# Patient Record
Sex: Female | Born: 1950 | Race: White | Hispanic: No | Marital: Married | Smoking: Never smoker
Health system: Southern US, Community
[De-identification: ages and names within clinical notes are randomized; demographics above are authoritative.]

## PROBLEM LIST (undated history)

## (undated) DIAGNOSIS — F411 Generalized anxiety disorder: Secondary | ICD-10-CM

## (undated) DIAGNOSIS — E785 Hyperlipidemia, unspecified: Secondary | ICD-10-CM

## (undated) DIAGNOSIS — G4730 Sleep apnea, unspecified: Secondary | ICD-10-CM

## (undated) DIAGNOSIS — K219 Gastro-esophageal reflux disease without esophagitis: Secondary | ICD-10-CM

## (undated) DIAGNOSIS — R0683 Snoring: Secondary | ICD-10-CM

## (undated) DIAGNOSIS — I10 Essential (primary) hypertension: Secondary | ICD-10-CM

## (undated) DIAGNOSIS — R1313 Dysphagia, pharyngeal phase: Secondary | ICD-10-CM

## (undated) DIAGNOSIS — H269 Unspecified cataract: Secondary | ICD-10-CM

## (undated) DIAGNOSIS — N870 Mild cervical dysplasia: Secondary | ICD-10-CM

## (undated) DIAGNOSIS — G3184 Mild cognitive impairment, so stated: Secondary | ICD-10-CM

## (undated) DIAGNOSIS — F32A Depression, unspecified: Secondary | ICD-10-CM

## (undated) DIAGNOSIS — F329 Major depressive disorder, single episode, unspecified: Secondary | ICD-10-CM

## (undated) DIAGNOSIS — G4700 Insomnia, unspecified: Secondary | ICD-10-CM

## (undated) HISTORY — PX: COLONOSCOPY: SHX174

## (undated) HISTORY — PX: TUBAL LIGATION: SHX77

## (undated) HISTORY — DX: Depression, unspecified: F32.A

## (undated) HISTORY — DX: Snoring: R06.83

## (undated) HISTORY — DX: Gastro-esophageal reflux disease without esophagitis: K21.9

## (undated) HISTORY — PX: HAMMER TOE SURGERY: SHX385

## (undated) HISTORY — DX: Sleep apnea, unspecified: G47.30

## (undated) HISTORY — PX: TENDON REPAIR: SHX5111

## (undated) HISTORY — DX: Essential (primary) hypertension: I10

## (undated) HISTORY — DX: Generalized anxiety disorder: F41.1

## (undated) HISTORY — DX: Insomnia, unspecified: G47.00

## (undated) HISTORY — DX: Dysphagia, pharyngeal phase: R13.13

## (undated) HISTORY — DX: Mild cervical dysplasia: N87.0

## (undated) HISTORY — PX: CATARACT EXTRACTION: SUR2

## (undated) HISTORY — DX: Unspecified cataract: H26.9

## (undated) HISTORY — PX: COLPOSCOPY: SHX161

## (undated) HISTORY — DX: Major depressive disorder, single episode, unspecified: F32.9

## (undated) HISTORY — DX: Mild cognitive impairment of uncertain or unknown etiology: G31.84

## (undated) HISTORY — PX: GYNECOLOGIC CRYOSURGERY: SHX857

## (undated) HISTORY — DX: Hyperlipidemia, unspecified: E78.5

---

## 1999-01-15 ENCOUNTER — Encounter: Payer: Self-pay | Admitting: Obstetrics and Gynecology

## 1999-01-15 ENCOUNTER — Encounter: Admission: RE | Admit: 1999-01-15 | Discharge: 1999-01-15 | Payer: Self-pay | Admitting: Internal Medicine

## 1999-02-04 ENCOUNTER — Encounter: Payer: Self-pay | Admitting: Obstetrics and Gynecology

## 1999-02-04 ENCOUNTER — Encounter: Admission: RE | Admit: 1999-02-04 | Discharge: 1999-02-04 | Payer: Self-pay | Admitting: Internal Medicine

## 1999-03-17 ENCOUNTER — Encounter: Payer: Self-pay | Admitting: Internal Medicine

## 2000-02-05 ENCOUNTER — Encounter: Payer: Self-pay | Admitting: Obstetrics and Gynecology

## 2000-02-05 ENCOUNTER — Encounter: Admission: RE | Admit: 2000-02-05 | Discharge: 2000-02-05 | Payer: Self-pay | Admitting: Obstetrics and Gynecology

## 2000-02-11 ENCOUNTER — Encounter: Payer: Self-pay | Admitting: Obstetrics and Gynecology

## 2000-02-11 ENCOUNTER — Encounter: Admission: RE | Admit: 2000-02-11 | Discharge: 2000-02-11 | Payer: Self-pay | Admitting: Obstetrics and Gynecology

## 2000-06-02 ENCOUNTER — Other Ambulatory Visit: Admission: RE | Admit: 2000-06-02 | Discharge: 2000-06-02 | Payer: Self-pay | Admitting: Obstetrics and Gynecology

## 2000-06-02 ENCOUNTER — Encounter (INDEPENDENT_AMBULATORY_CARE_PROVIDER_SITE_OTHER): Payer: Self-pay

## 2001-04-18 ENCOUNTER — Encounter: Payer: Self-pay | Admitting: Obstetrics and Gynecology

## 2001-04-18 ENCOUNTER — Encounter: Admission: RE | Admit: 2001-04-18 | Discharge: 2001-04-18 | Payer: Self-pay | Admitting: Obstetrics and Gynecology

## 2001-04-18 ENCOUNTER — Other Ambulatory Visit: Admission: RE | Admit: 2001-04-18 | Discharge: 2001-04-18 | Payer: Self-pay | Admitting: Obstetrics and Gynecology

## 2002-05-01 ENCOUNTER — Encounter: Admission: RE | Admit: 2002-05-01 | Discharge: 2002-05-01 | Payer: Self-pay | Admitting: Obstetrics and Gynecology

## 2002-05-01 ENCOUNTER — Encounter: Payer: Self-pay | Admitting: Obstetrics and Gynecology

## 2003-05-06 ENCOUNTER — Encounter: Admission: RE | Admit: 2003-05-06 | Discharge: 2003-05-06 | Payer: Self-pay | Admitting: Obstetrics and Gynecology

## 2004-02-24 ENCOUNTER — Ambulatory Visit: Payer: Self-pay | Admitting: Family Medicine

## 2004-05-11 ENCOUNTER — Ambulatory Visit: Payer: Self-pay | Admitting: Family Medicine

## 2004-05-25 ENCOUNTER — Ambulatory Visit: Payer: Self-pay | Admitting: Family Medicine

## 2004-06-08 ENCOUNTER — Ambulatory Visit: Payer: Self-pay | Admitting: Family Medicine

## 2004-07-30 ENCOUNTER — Ambulatory Visit: Payer: Self-pay | Admitting: Family Medicine

## 2004-08-29 ENCOUNTER — Ambulatory Visit: Payer: Self-pay | Admitting: Internal Medicine

## 2004-08-29 ENCOUNTER — Inpatient Hospital Stay (HOSPITAL_COMMUNITY): Admission: EM | Admit: 2004-08-29 | Discharge: 2004-09-01 | Payer: Self-pay | Admitting: Emergency Medicine

## 2004-09-08 ENCOUNTER — Ambulatory Visit: Payer: Self-pay | Admitting: Internal Medicine

## 2004-10-06 ENCOUNTER — Ambulatory Visit: Payer: Self-pay | Admitting: Internal Medicine

## 2004-10-30 ENCOUNTER — Other Ambulatory Visit: Admission: RE | Admit: 2004-10-30 | Discharge: 2004-10-30 | Payer: Self-pay | Admitting: Obstetrics and Gynecology

## 2004-12-01 ENCOUNTER — Ambulatory Visit: Payer: Self-pay | Admitting: Internal Medicine

## 2004-12-28 ENCOUNTER — Ambulatory Visit (HOSPITAL_BASED_OUTPATIENT_CLINIC_OR_DEPARTMENT_OTHER): Admission: RE | Admit: 2004-12-28 | Discharge: 2004-12-28 | Payer: Self-pay | Admitting: Neurosurgery

## 2005-01-07 ENCOUNTER — Ambulatory Visit: Payer: Self-pay | Admitting: Pulmonary Disease

## 2005-01-13 ENCOUNTER — Encounter: Admission: RE | Admit: 2005-01-13 | Discharge: 2005-01-13 | Payer: Self-pay | Admitting: Obstetrics and Gynecology

## 2005-02-03 ENCOUNTER — Encounter: Admission: RE | Admit: 2005-02-03 | Discharge: 2005-02-03 | Payer: Self-pay | Admitting: Obstetrics and Gynecology

## 2005-02-04 ENCOUNTER — Ambulatory Visit: Payer: Self-pay | Admitting: Internal Medicine

## 2005-05-06 ENCOUNTER — Ambulatory Visit: Payer: Self-pay | Admitting: Internal Medicine

## 2005-08-06 ENCOUNTER — Encounter: Admission: RE | Admit: 2005-08-06 | Discharge: 2005-08-06 | Payer: Self-pay | Admitting: Obstetrics and Gynecology

## 2005-09-28 ENCOUNTER — Ambulatory Visit: Payer: Self-pay | Admitting: Internal Medicine

## 2005-10-05 ENCOUNTER — Ambulatory Visit: Payer: Self-pay | Admitting: Internal Medicine

## 2005-11-26 ENCOUNTER — Other Ambulatory Visit: Admission: RE | Admit: 2005-11-26 | Discharge: 2005-11-26 | Payer: Self-pay | Admitting: Obstetrics and Gynecology

## 2006-01-21 ENCOUNTER — Encounter: Admission: RE | Admit: 2006-01-21 | Discharge: 2006-01-21 | Payer: Self-pay | Admitting: Obstetrics and Gynecology

## 2006-03-29 ENCOUNTER — Ambulatory Visit: Payer: Self-pay | Admitting: Internal Medicine

## 2006-10-06 ENCOUNTER — Ambulatory Visit: Payer: Self-pay | Admitting: Internal Medicine

## 2006-10-06 LAB — CONVERTED CEMR LAB
ALT: 22 units/L (ref 0–35)
Albumin: 4.2 g/dL (ref 3.5–5.2)
Alkaline Phosphatase: 55 units/L (ref 39–117)
BUN: 17 mg/dL (ref 6–23)
Basophils Absolute: 0 10*3/uL (ref 0.0–0.1)
Basophils Relative: 0.5 % (ref 0.0–1.0)
Bilirubin Urine: NEGATIVE
Blood in Urine, dipstick: NEGATIVE
CO2: 26 meq/L (ref 19–32)
Calcium: 9.6 mg/dL (ref 8.4–10.5)
Cholesterol: 268 mg/dL (ref 0–200)
Creatinine, Ser: 0.8 mg/dL (ref 0.4–1.2)
GFR calc Af Amer: 95 mL/min
Glucose, Urine, Semiquant: NEGATIVE
HDL: 42 mg/dL (ref >39.0)
Hemoglobin: 13.1 g/dL (ref 12.0–15.0)
Ketones, urine, test strip: NEGATIVE
MCHC: 34.7 g/dL (ref 30.0–36.0)
Monocytes Absolute: 0.6 10*3/uL (ref 0.2–0.7)
Monocytes Relative: 8.5 % (ref 3.0–11.0)
Platelets: 309 10*3/uL (ref 150–400)
Potassium: 4.8 meq/L (ref 3.5–5.1)
Protein, U semiquant: NEGATIVE
RDW: 12.1 % (ref 11.5–14.6)
TSH: 5.91 microintl units/mL — ABNORMAL HIGH (ref 0.35–5.50)
Total CHOL/HDL Ratio: 6.4
Triglycerides: 135 mg/dL (ref 0–149)
Urobilinogen, UA: 0.2
VLDL: 27 mg/dL (ref 0–40)
pH: 5.5

## 2006-10-10 DIAGNOSIS — I10 Essential (primary) hypertension: Secondary | ICD-10-CM | POA: Insufficient documentation

## 2006-10-13 ENCOUNTER — Ambulatory Visit: Payer: Self-pay | Admitting: Internal Medicine

## 2006-10-13 DIAGNOSIS — E785 Hyperlipidemia, unspecified: Secondary | ICD-10-CM | POA: Insufficient documentation

## 2006-10-13 DIAGNOSIS — F3342 Major depressive disorder, recurrent, in full remission: Secondary | ICD-10-CM

## 2006-11-28 ENCOUNTER — Other Ambulatory Visit: Admission: RE | Admit: 2006-11-28 | Discharge: 2006-11-28 | Payer: Self-pay | Admitting: Obstetrics and Gynecology

## 2006-12-14 ENCOUNTER — Ambulatory Visit: Payer: Self-pay | Admitting: Internal Medicine

## 2006-12-14 LAB — CONVERTED CEMR LAB
ALT: 40 units/L — ABNORMAL HIGH (ref 0–35)
AST: 37 units/L (ref 0–37)
Cholesterol: 193 mg/dL (ref 0–200)
LDL Cholesterol: 129 mg/dL — ABNORMAL HIGH (ref 0–99)
Total CHOL/HDL Ratio: 3.7
Triglycerides: 61 mg/dL (ref 0–149)

## 2006-12-19 ENCOUNTER — Ambulatory Visit: Payer: Self-pay | Admitting: Internal Medicine

## 2007-02-24 ENCOUNTER — Telehealth: Payer: Self-pay | Admitting: Internal Medicine

## 2007-03-08 ENCOUNTER — Encounter: Admission: RE | Admit: 2007-03-08 | Discharge: 2007-03-08 | Payer: Self-pay | Admitting: Obstetrics and Gynecology

## 2007-04-14 ENCOUNTER — Ambulatory Visit: Payer: Self-pay | Admitting: Internal Medicine

## 2007-06-12 ENCOUNTER — Telehealth: Payer: Self-pay | Admitting: *Deleted

## 2007-07-07 ENCOUNTER — Telehealth: Payer: Self-pay | Admitting: Internal Medicine

## 2007-08-07 ENCOUNTER — Telehealth: Payer: Self-pay | Admitting: Internal Medicine

## 2007-08-25 ENCOUNTER — Ambulatory Visit: Payer: Self-pay | Admitting: Internal Medicine

## 2007-08-25 DIAGNOSIS — K219 Gastro-esophageal reflux disease without esophagitis: Secondary | ICD-10-CM

## 2007-10-27 ENCOUNTER — Ambulatory Visit: Payer: Self-pay | Admitting: Internal Medicine

## 2007-10-27 LAB — CONVERTED CEMR LAB
Blood in Urine, dipstick: NEGATIVE
Nitrite: NEGATIVE
Protein, U semiquant: NEGATIVE
Urobilinogen, UA: 0.2
WBC Urine, dipstick: NEGATIVE

## 2008-02-21 ENCOUNTER — Ambulatory Visit: Payer: Self-pay | Admitting: Internal Medicine

## 2008-02-21 ENCOUNTER — Telehealth: Payer: Self-pay | Admitting: Internal Medicine

## 2008-02-29 ENCOUNTER — Ambulatory Visit: Payer: Self-pay | Admitting: Internal Medicine

## 2008-02-29 LAB — CONVERTED CEMR LAB
AST: 25 units/L (ref 0–37)
Albumin: 4.1 g/dL (ref 3.5–5.2)
Alkaline Phosphatase: 43 units/L (ref 39–117)
BUN: 21 mg/dL (ref 6–23)
Bilirubin Urine: NEGATIVE
Bilirubin, Direct: 0.1 mg/dL (ref 0.0–0.3)
Blood in Urine, dipstick: NEGATIVE
Chloride: 111 meq/L (ref 96–112)
Cholesterol: 201 mg/dL (ref 0–200)
Eosinophils Absolute: 0.4 10*3/uL (ref 0.0–0.7)
Eosinophils Relative: 4.7 % (ref 0.0–5.0)
GFR calc Af Amer: 73 mL/min
GFR calc non Af Amer: 60 mL/min
HDL: 46.5 mg/dL (ref >39.0)
Ketones, urine, test strip: NEGATIVE
MCV: 91.7 fL (ref 78.0–100.0)
Monocytes Absolute: 0.7 10*3/uL (ref 0.1–1.0)
Monocytes Relative: 9 % (ref 3.0–12.0)
Neutrophils Relative %: 55.3 % (ref 43.0–77.0)
Nitrite: NEGATIVE
Platelets: 320 10*3/uL (ref 150–400)
Potassium: 4.3 meq/L (ref 3.5–5.1)
RDW: 11.9 % (ref 11.5–14.6)
Sodium: 140 meq/L (ref 135–145)
Total Bilirubin: 0.7 mg/dL (ref 0.3–1.2)
Total CHOL/HDL Ratio: 4.3
Triglycerides: 84 mg/dL (ref 0–149)
Urobilinogen, UA: 0.2
WBC: 7.6 10*3/uL (ref 4.5–10.5)

## 2008-03-08 ENCOUNTER — Ambulatory Visit: Payer: Self-pay | Admitting: Internal Medicine

## 2008-03-08 DIAGNOSIS — R1313 Dysphagia, pharyngeal phase: Secondary | ICD-10-CM

## 2008-03-08 DIAGNOSIS — R0683 Snoring: Secondary | ICD-10-CM

## 2008-03-08 HISTORY — DX: Snoring: R06.83

## 2008-03-08 HISTORY — DX: Dysphagia, pharyngeal phase: R13.13

## 2008-03-25 ENCOUNTER — Ambulatory Visit: Payer: Self-pay | Admitting: Gastroenterology

## 2008-04-10 ENCOUNTER — Ambulatory Visit: Payer: Self-pay | Admitting: Gastroenterology

## 2008-04-12 ENCOUNTER — Telehealth (INDEPENDENT_AMBULATORY_CARE_PROVIDER_SITE_OTHER): Payer: Self-pay | Admitting: *Deleted

## 2008-05-31 ENCOUNTER — Ambulatory Visit: Payer: Self-pay | Admitting: Internal Medicine

## 2008-05-31 ENCOUNTER — Ambulatory Visit: Payer: Self-pay | Admitting: Gastroenterology

## 2008-05-31 LAB — CONVERTED CEMR LAB
Albumin: 3.9 g/dL (ref 3.5–5.2)
Alkaline Phosphatase: 41 units/L (ref 39–117)
Cholesterol: 196 mg/dL (ref 0–200)
HDL: 44.6 mg/dL (ref >39.00)
LDL Cholesterol: 140 mg/dL — ABNORMAL HIGH (ref 0–99)
Total CHOL/HDL Ratio: 4
Total Protein: 7.7 g/dL (ref 6.0–8.3)
Triglycerides: 57 mg/dL (ref 0.0–149.0)
VLDL: 11.4 mg/dL (ref 0.0–40.0)

## 2008-06-10 ENCOUNTER — Ambulatory Visit: Payer: Self-pay | Admitting: Internal Medicine

## 2008-10-03 ENCOUNTER — Ambulatory Visit: Payer: Self-pay | Admitting: Internal Medicine

## 2008-10-03 LAB — CONVERTED CEMR LAB
ALT: 23 units/L (ref 0–35)
Alkaline Phosphatase: 40 units/L (ref 39–117)
Bilirubin, Direct: 0.1 mg/dL (ref 0.0–0.3)
CO2: 28 meq/L (ref 19–32)
Chloride: 103 meq/L (ref 96–112)
Creatinine, Ser: 1.1 mg/dL (ref 0.4–1.2)
LDL Cholesterol: 124 mg/dL — ABNORMAL HIGH (ref 0–99)
Potassium: 4 meq/L (ref 3.5–5.1)
Sodium: 138 meq/L (ref 135–145)
Total Bilirubin: 0.8 mg/dL (ref 0.3–1.2)
Total CHOL/HDL Ratio: 4
Total Protein: 7.5 g/dL (ref 6.0–8.3)
Triglycerides: 66 mg/dL (ref 0.0–149.0)

## 2008-10-11 ENCOUNTER — Ambulatory Visit: Payer: Self-pay | Admitting: Internal Medicine

## 2008-10-11 DIAGNOSIS — E669 Obesity, unspecified: Secondary | ICD-10-CM

## 2009-03-04 ENCOUNTER — Encounter: Admission: RE | Admit: 2009-03-04 | Discharge: 2009-03-04 | Payer: Self-pay | Admitting: Obstetrics and Gynecology

## 2009-03-07 ENCOUNTER — Other Ambulatory Visit: Admission: RE | Admit: 2009-03-07 | Discharge: 2009-03-07 | Payer: Self-pay | Admitting: Obstetrics and Gynecology

## 2009-03-07 ENCOUNTER — Ambulatory Visit: Payer: Self-pay | Admitting: Obstetrics and Gynecology

## 2009-05-08 ENCOUNTER — Ambulatory Visit: Payer: Self-pay | Admitting: Obstetrics and Gynecology

## 2009-05-08 ENCOUNTER — Encounter: Payer: Self-pay | Admitting: Internal Medicine

## 2009-11-06 ENCOUNTER — Ambulatory Visit: Payer: Self-pay | Admitting: Family Medicine

## 2009-12-02 ENCOUNTER — Ambulatory Visit: Payer: Self-pay | Admitting: Internal Medicine

## 2009-12-02 LAB — CONVERTED CEMR LAB
Albumin: 4.1 g/dL (ref 3.5–5.2)
BUN: 26 mg/dL — ABNORMAL HIGH (ref 6–23)
Basophils Absolute: 0 10*3/uL (ref 0.0–0.1)
CO2: 27 meq/L (ref 19–32)
Calcium: 9.8 mg/dL (ref 8.4–10.5)
Cholesterol: 164 mg/dL (ref 0–200)
Eosinophils Absolute: 0.5 10*3/uL (ref 0.0–0.7)
Glucose, Bld: 110 mg/dL — ABNORMAL HIGH (ref 70–99)
HCT: 34.7 % — ABNORMAL LOW (ref 36.0–46.0)
HDL: 47.2 mg/dL (ref >39.00)
Hemoglobin: 11.9 g/dL — ABNORMAL LOW (ref 12.0–15.0)
Ketones, ur: NEGATIVE mg/dL
Lymphs Abs: 2.3 10*3/uL (ref 0.7–4.0)
MCHC: 34.2 g/dL (ref 30.0–36.0)
Neutro Abs: 3.7 10*3/uL (ref 1.4–7.7)
Nitrite: NEGATIVE
Platelets: 311 10*3/uL (ref 150.0–400.0)
Potassium: 4.6 meq/L (ref 3.5–5.1)
RDW: 13 % (ref 11.5–14.6)
Sodium: 140 meq/L (ref 135–145)
Specific Gravity, Urine: 1.02 (ref 1.000–1.030)
TSH: 4.94 microintl units/mL (ref 0.35–5.50)
Total Bilirubin: 0.7 mg/dL (ref 0.3–1.2)
Urobilinogen, UA: 0.2 (ref 0.0–1.0)

## 2009-12-09 ENCOUNTER — Encounter: Payer: Self-pay | Admitting: Internal Medicine

## 2009-12-09 ENCOUNTER — Ambulatory Visit: Payer: Self-pay | Admitting: Internal Medicine

## 2009-12-09 DIAGNOSIS — R739 Hyperglycemia, unspecified: Secondary | ICD-10-CM

## 2009-12-09 DIAGNOSIS — D6489 Other specified anemias: Secondary | ICD-10-CM | POA: Insufficient documentation

## 2009-12-09 LAB — CONVERTED CEMR LAB: Transferrin: 377.4 mg/dL — ABNORMAL HIGH (ref 212.0–360.0)

## 2009-12-12 ENCOUNTER — Ambulatory Visit: Payer: Self-pay | Admitting: Internal Medicine

## 2010-01-27 ENCOUNTER — Encounter (INDEPENDENT_AMBULATORY_CARE_PROVIDER_SITE_OTHER): Payer: Self-pay | Admitting: *Deleted

## 2010-02-15 ENCOUNTER — Encounter: Payer: Self-pay | Admitting: Obstetrics and Gynecology

## 2010-02-26 NOTE — Assessment & Plan Note (Signed)
Summary: ?bronchitus/cjr   Vital Signs:  Patient profile:   60 year old female Weight:      183 pounds Temp:     99.0 degrees F oral BP sitting:   160 / 92  (left arm) Cuff size:   regular  Vitals Entered By: Sid Falcon LPN (November 06, 2009 4:29 PM)  History of Present Illness: Acute visit. One-week history of nasal congestion and cough productive of green sputum. Symptoms seem to be getting worse. Frequent postnasal drip symptoms. Nonsmoker. No significant headaches or facial pain. Using Mucinex DM without much relief.  Allergies: 1)  ! * Lotrel  Past History:  Past Medical History: Last updated: 03/25/2008 High cholesterol Hypertension Depression GERD PMH reviewed for relevance  Review of Systems      See HPI  Physical Exam  General:  Well-developed,well-nourished,in no acute distress; alert,appropriate and cooperative throughout examination Ears:  External ear exam shows no significant lesions or deformities.  Otoscopic examination reveals clear canals, tympanic membranes are intact bilaterally without bulging, retraction, inflammation or discharge. Hearing is grossly normal bilaterally. Nose:  External nasal examination shows no deformity or inflammation. Nasal mucosa are pink and moist without lesions or exudates. Mouth:  Oral mucosa and oropharynx without lesions or exudates.  Teeth in good repair. Neck:  No deformities, masses, or tenderness noted. Lungs:  Normal respiratory effort, chest expands symmetrically. Lungs are clear to auscultation, no crackles or wheezes. Heart:  Normal rate and regular rhythm. S1 and S2 normal without gallop, murmur, click, rub or other extra sounds. Skin:  no rashes.     Impression & Recommendations:  Problem # 1:  SINUSITIS, ACUTE (ICD-461.9)  Her updated medication list for this problem includes:    Azithromycin 250 Mg Tabs (Azithromycin) .Marland Kitchen... 2 by mouth today then one by mouth once daily for 4 days.  Complete Medication  List: 1)  Azor 5-40 Mg Tabs (Amlodipine-olmesartan) .... One by mouth daily 2)  Ambien 10 Mg Tabs (Zolpidem tartrate) .... One by mouth q hs 3)  Lipofen 150 Mg Caps (Fenofibrate) .... One by mouth daily 4)  Lexapro 20 Mg Tabs (Escitalopram oxalate) .... 1/2 by mouth daily 5)  Fish Oil Concentrate 1000 Mg Caps (Omega-3 fatty acids) .... Take 1 tablet by mouth once a day 6)  Calcium Carbonate-vitamin D 600-400 Mg-unit Tabs (Calcium carbonate-vitamin d) .... One tablet by mouth once daily 7)  Multivitamins Tabs (Multiple vitamin) .... One tablet by mouth once daily 8)  Aciphex 20 Mg Tbec (Rabeprazole sodium) .... One by mouth daily 9)  Travatan 0.004 % Soln (Travoprost) .... Both eye once daily 10)  Azithromycin 250 Mg Tabs (Azithromycin) .... 2 by mouth today then one by mouth once daily for 4 days.  Patient Instructions: 1)  Acute Bronchitis symptoms for less then 10 days are not  helped by antibiotics. Take over the counter cough medications. Call if no improvement in 5-7 days, sooner if increasing cough, fever, or new symptoms ( shortness of breath, chest pain) .  Prescriptions: AZITHROMYCIN 250 MG TABS (AZITHROMYCIN) 2 by mouth today then one by mouth once daily for 4 days.  #6 x 0   Entered and Authorized by:   Evelena Peat MD   Signed by:   Evelena Peat MD on 11/06/2009   Method used:   Print then Give to Patient   RxID:   [id number]

## 2010-02-26 NOTE — Assessment & Plan Note (Signed)
Summary: cpx//ccm   Vital Signs:  Patient profile:   60 year old female Height:      65 inches Weight:      180 pounds BMI:     30.06 Temp:     98.2 degrees F oral Pulse rate:   72 / minute Resp:     14 per minute BP sitting:   136 / 80  (left arm)  Vitals Entered By: Willy Eddy, LPN (December 09, 2009 3:08 PM) CC: cpx Is Patient Diabetic? No   Primary Care Provider:  Darryll Capers, MD  CC:  cpx.  History of Present Illness: The pt was asked about all immunizations, health maint. services that are appropriate to their age and was given guidance on diet exercize  and weight management   The pt has a hx  GERD and noted mild anemai. She has not had a colon ... 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) 10)  Preventive Health Care  (ICD-V70.0) 11)  Depression  (ICD-311) 12)  Hyperlipidemia  (ICD-272.4) 13)  Family History Diabetes 1st Degree Relative  (ICD-V18.0) 14)  Hypertension  (ICD-401.9)  Current Problems (verified): 1)  Sinusitis, Acute  (ICD-461.9) 2)  Overweight  (ICD-278.02) 3)  Snoring  (ICD-786.09) 4)  Dysphagia Pharyngeal Phase  (ONG-295.28) 5)  Pneumonia Due To Mycoplasma Pneumoniae  (ICD-483.0) 6)  Acute Cystitis  (ICD-595.0) 7)  Urinary Frequency  (ICD-788.41) 8)  Gerd  (ICD-530.81) 9)  Hyperlipidemia, 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 One By Mouth Daily 2)  Ambien 10 Mg  Tabs (Zolpidem Tartrate) .... One By Mouth Q Hs 3)  Lipofen 150 Mg Caps (Fenofibrate) .... One By Mouth Daily 4)  Lexapro 20 Mg Tabs (Escitalopram Oxalate) .... 1/2 By Mouth Daily 5)  Fish Oil Concentrate 1000 Mg Caps (Omega-3 Fatty Acids) .... Take 1 Tablet By Mouth Once A Day 6)  Calcium Carbonate-Vitamin D 600-400 Mg-Unit  Tabs (Calcium Carbonate-Vitamin D) .... One Tablet By Mouth Once Daily 7)  Multivitamins   Tabs (Multiple Vitamin) .... One Tablet By Mouth Once Daily 8)  Aciphex 20 Mg Tbec (Rabeprazole Sodium) .... One By Mouth Daily 9)  Travatan 0.004 % Soln (Travoprost) .... Both Eye Once Daily 10)  Azithromycin 250 Mg Tabs (Azithromycin) .... 2 By Mouth Today Then One By Mouth Once Daily For 4 Days.  Current Medications (verified): 1)  Azor 5-40 Mg  Tabs (Amlodipine-Olmesartan) .... One By Mouth Daily 2)  Ambien 10 Mg  Tabs (Zolpidem Tartrate) .... One By Mouth Q Hs 3)  Lipofen 150 Mg Caps (Fenofibrate) .... One By Mouth Daily 4)  Lexapro 20 Mg Tabs (Escitalopram Oxalate) .... 1/2 By Mouth Daily 5)  Fish Oil Concentrate 1000 Mg Caps (Omega-3  Fatty Acids) .... Take 1 Tablet By Mouth Once A Day 6)  Calcium Carbonate-Vitamin D 600-400 Mg-Unit  Tabs (Calcium Carbonate-Vitamin D) .... One Tablet By Mouth Once Daily 7)  Multivitamins   Tabs (Multiple Vitamin) .... One Tablet By Mouth Once Daily 8)  Prevacid 15 Mg Cpdr (Lansoprazole) .Marland Kitchen.. 1 Once Daily 9)  Travatan 0.004 % Soln (Travoprost) .... Both Eye Once Daily  Allergies (verified): 1)  ! * Lotrel  Past History:  Family History: Last updated: 03/25/2008 Family History Diabetes 1st degree relative  sister Family History Hypertension Family History of Stroke M 1st degree relative <50 no colon cancer  Social History: Last updated:  03/25/2008 Married Never Smoked Alcohol use-no Drug use-no Regular exercise-yes  Risk Factors: Exercise: yes (10/10/2006)  Risk Factors: Smoking Status: never (12/09/2009)  Past medical, surgical, family and social histories (including risk factors) reviewed, and no changes noted (except as noted below).  Past Medical History: Reviewed history from 03/25/2008 and no changes required. High cholesterol Hypertension Depression GERD  Past Surgical History: Reviewed history from 03/25/2008 and no changes required. Denies surgical history  Family History: Reviewed history from 03/25/2008 and no changes required. Family History Diabetes 1st degree relative  sister Family History Hypertension Family History of Stroke M 1st degree relative <50 no colon cancer  Social History: Reviewed history from 03/25/2008 and no changes required. Married Never Smoked Alcohol use-no Drug use-no Regular exercise-yes  Review of Systems  The patient denies anorexia, fever, weight loss, weight gain, vision loss, decreased hearing, hoarseness, chest pain, syncope, dyspnea on exertion, peripheral edema, prolonged cough, headaches, hemoptysis, abdominal pain, melena, hematochezia, severe indigestion/heartburn, hematuria, incontinence, genital sores, muscle weakness, suspicious skin lesions, transient blindness, difficulty walking, depression, unusual weight change, abnormal bleeding, enlarged lymph nodes, angioedema, and breast masses.    Physical Exam  General:  Well-developed,well-nourished,in no acute distress; alert,appropriate and cooperative throughout examination Head:  normocephalic and no abnormalities palpated.   Eyes:  pupils equal and pupils round.   Ears:  R ear normal and L ear normal.   Nose:  External nasal examination shows no deformity or inflammation. Nasal mucosa are pink and moist without lesions or exudates. Mouth:  Oral mucosa and oropharynx without lesions or exudates.   Teeth in good repair. Neck:  No deformities, masses, or tenderness noted. Lungs:  Normal respiratory effort, chest expands symmetrically. Lungs are clear to auscultation, no crackles or wheezes. Heart:  Normal rate and regular rhythm. S1 and S2 normal without gallop, murmur, click, rub or other extra sounds.   Impression & Recommendations:  Problem # 1:  OVERWEIGHT (ICD-278.02)  weigth  loss neded  Ht: 65 (12/09/2009)   Wt: 180 (12/09/2009)   BMI: 30.06 (12/09/2009)  Problem # 2:  HYPERLIPIDEMIA, TYPE IV (ICD-272.4) stable Her updated medication list for this problem includes:    Lipofen 150 Mg Caps (Fenofibrate) ..... One by mouth daily  Labs Reviewed: SGOT: 27 (12/02/2009)   SGPT: 22 (12/02/2009)  Prior 10 Yr Risk Heart Disease: 13 % (04/14/2007)   HDL:47.20 (12/02/2009), 47.20 (10/03/2008)  LDL:107 (12/02/2009), 124 (10/03/2008)  Chol:164 (12/02/2009), 184 (10/03/2008)  Trig:49.0 (12/02/2009), 66.0 (10/03/2008)  Problem # 3:  PREDIABETES (ICD-790.29)  Labs Reviewed: Creat: 1.1 (12/02/2009)     Problem # 4:  PREVENTIVE HEALTH CARE (ICD-V70.0) Assessment: Unchanged  Orders: EKG w/ Interpretation (93000) Gastroenterology Referral (GI)  Mammogram: normal (05/23/2009) Pap smear: normal (05/23/2009) Td Booster: Historical (01/26/2003)   Chol: 164 (12/02/2009)   HDL: 47.20 (12/02/2009)   LDL: 107 (12/02/2009)  TG: 49.0 (12/02/2009) TSH: 4.94 (12/02/2009)   Next mammogram due:: 05/2010 (12/09/2009)  Discussed using sunscreen, use of alcohol, drug use, self breast exam, routine dental care, routine eye care, schedule for GYN exam, routine physical exam, seat belts, multiple vitamins, osteoporosis prevention, adequate calcium intake in diet, recommendations for immunizations, mammograms and Pap smears.  Discussed exercise and checking cholesterol.  Discussed gun safety, safe sex, and contraception.  Problem # 5:  HYPERTENSION (ICD-401.9)  Her updated medication list for this  problem includes:    Azor 5-40 Mg Tabs (Amlodipine-olmesartan) ..... One by mouth daily  BP today: 136/80 Prior BP: 160/92 (11/06/2009)  Prior 10 Yr Risk Heart Disease: 13 % (04/14/2007)  Labs Reviewed: K+: 4.6 (12/02/2009) Creat: : 1.1 (12/02/2009)   Chol: 164 (12/02/2009)   HDL: 47.20 (12/02/2009)   LDL: 107 (12/02/2009)   TG: 49.0 (12/02/2009)  Complete Medication List: 1)  Azor 5-40 Mg Tabs (Amlodipine-olmesartan) .... One by mouth daily 2)  Ambien 10 Mg Tabs (Zolpidem tartrate) .... One by mouth q hs 3)  Lipofen 150 Mg Caps (Fenofibrate) .... One by mouth daily 4)  Lexapro 20 Mg Tabs (Escitalopram oxalate) .... 1/2 by mouth daily 5)  Fish Oil Concentrate 1000 Mg Caps (Omega-3 fatty acids) .... Take 1 tablet by mouth once a day 6)  Calcium Carbonate-vitamin D 600-400 Mg-unit Tabs (Calcium carbonate-vitamin d) .... One tablet by mouth once daily 7)  Multivitamins Tabs (Multiple vitamin) .... One tablet by mouth once daily 8)  Prevacid 15 Mg Cpdr (Lansoprazole) .Marland Kitchen.. 1 once daily 9)  Travatan 0.004 % Soln (Travoprost) .... Both eye once daily  Other Orders: Venipuncture ([address]) TLB-IBC Pnl (Iron/FE;Transferrin) (83550-IBC)  Patient Instructions: 1)  Please schedule a follow-up appointment in 4 months.   Orders Added: 1)  EKG w/ Interpretation [93000] 2)  Venipuncture [36415] 3)  TLB-IBC Pnl (Iron/FE;Transferrin) [83550-IBC] 4)  Gastroenterology Referral [GI]     Preventive Care Screening  Mammogram:    Date:  05/23/2009    Next Due:  05/2010    Results:  normal   Pap Smear:    Date:  05/23/2009    Next Due:  05/2010    Results:  normal    Appended Document: Orders Update    Clinical Lists Changes  Orders: Added new Service order of Specimen Handling (60454) - Signed

## 2010-02-26 NOTE — Assessment & Plan Note (Signed)
Summary: singles vaccine at 12:30/bmw  Nurse Visit   Allergies: 1)  ! * Lotrel  Immunizations Administered:  Zostavax # 1:    Vaccine Type: Zostavax    Site: left deltoid    Mfr: Merck    Dose: 0.5 ml    Route: [place]    Given by: Willy Eddy, LPN    Exp. Date: 09/12/2010    Lot #: [id number]    VIS given: 11/06/04 given December 12, 2009.  Orders Added: 1)  Zoster (Shingles) Vaccine Live [90736] 2)  Admin 1st Vaccine [phone]

## 2010-02-26 NOTE — Letter (Signed)
Summary: Referral - not able to see patient  [facility]  [address] [place], [place] [address]   Phone: [phone]  Fax: [phone]    January 27, 2010  Darryll Capers, MD [address] [place], [place] [address]   Re:   Ann Washington DOB:  Feb 17, 1950 MRN:   [id number]    Dear Dr. Lovell Sheehan:  Thank you for your kind referral of the above patient.  We have attempted to schedule the recommended procedure Screening Colonoscopy but have not been able to schedule because:  X   The patient was not available by phone and/or has not returned our calls.  ___ The patient declined to schedule the procedure at this time.  We appreciate the referral and hope that we will have the opportunity to treat this patient in the future.    Sincerely,    [facility] [phone]

## 2010-03-27 ENCOUNTER — Ambulatory Visit: Payer: Self-pay | Admitting: Internal Medicine

## 2010-04-24 ENCOUNTER — Encounter: Payer: Self-pay | Admitting: Internal Medicine

## 2010-04-27 ENCOUNTER — Ambulatory Visit (INDEPENDENT_AMBULATORY_CARE_PROVIDER_SITE_OTHER): Payer: 59 | Admitting: Internal Medicine

## 2010-04-27 ENCOUNTER — Encounter: Payer: Self-pay | Admitting: Internal Medicine

## 2010-04-27 VITALS — BP 130/84 | HR 80 | Temp 98.7°F | Resp 14 | Ht 65.0 in | Wt 175.0 lb

## 2010-04-27 DIAGNOSIS — R7309 Other abnormal glucose: Secondary | ICD-10-CM

## 2010-04-27 DIAGNOSIS — D6489 Other specified anemias: Secondary | ICD-10-CM

## 2010-04-27 DIAGNOSIS — I10 Essential (primary) hypertension: Secondary | ICD-10-CM

## 2010-04-27 DIAGNOSIS — F329 Major depressive disorder, single episode, unspecified: Secondary | ICD-10-CM

## 2010-04-27 LAB — CBC WITH DIFFERENTIAL/PLATELET
Basophils Relative: 0.3 % (ref 0.0–3.0)
Eosinophils Relative: 4.3 % (ref 0.0–5.0)
Hemoglobin: 12.3 g/dL (ref 12.0–15.0)
MCV: 92.3 fl (ref 78.0–100.0)
Monocytes Absolute: 0.6 10*3/uL (ref 0.1–1.0)
Neutrophils Relative %: 52.9 % (ref 43.0–77.0)
RBC: 3.9 Mil/uL (ref 3.87–5.11)
WBC: 7 10*3/uL (ref 4.5–10.5)

## 2010-04-27 LAB — BASIC METABOLIC PANEL
CO2: 26 mEq/L (ref 19–32)
Calcium: 9.5 mg/dL (ref 8.4–10.5)
Glucose, Bld: 93 mg/dL (ref 70–99)
Potassium: 4.5 mEq/L (ref 3.5–5.1)
Sodium: 136 mEq/L (ref 135–145)

## 2010-04-27 LAB — LIPID PANEL
HDL: 47.7 mg/dL (ref >39.00)
Total CHOL/HDL Ratio: 4
VLDL: 13.8 mg/dL (ref 0.0–40.0)

## 2010-04-27 LAB — HEMOGLOBIN A1C: Hgb A1c MFr Bld: 6.1 % (ref 4.6–6.5)

## 2010-04-27 NOTE — Progress Notes (Signed)
  Subjective:    Patient ID: Ann Washington, female    DOB: 06-13-1950, 60 y.o.   MRN: [id number]  HPI agent is a 60 year old white female presents for followup of hypertension hyperlipidemia chronic reflux esophagitis with a history of mild anemia and for prediabetes or just metabolic syndrome.  She is fasting today for blood work which will be ordered to monitor her blood sugar and lipid levels.  She is tolerant of treating her hypertensive medications well and blood pressures well controlled she denies any chest pain shortness of breath PND or orthopnea. Her reflux is stable she denies any dark stools.    Review of Systems  Constitutional: Negative for activity change, appetite change and fatigue.  HENT: Negative for ear pain, congestion, neck pain, postnasal drip and sinus pressure.   Eyes: Negative for redness and visual disturbance.  Respiratory: Negative for cough, shortness of breath and wheezing.   Gastrointestinal: Negative for abdominal pain and abdominal distention.  Genitourinary: Negative for dysuria, frequency and menstrual problem.  Musculoskeletal: Negative for myalgias, joint swelling and arthralgias.  Skin: Negative for rash and wound.  Neurological: Negative for dizziness, weakness and headaches.  Hematological: Negative for adenopathy. Does not bruise/bleed easily.  Psychiatric/Behavioral: Negative for sleep disturbance and decreased concentration.       Past Medical History  Diagnosis Date  . Depression   . GERD (gastroesophageal reflux disease)   . Hyperlipidemia   . Hypertension    History reviewed. No pertinent past surgical history.  reports that she has never smoked. She does not have any smokeless tobacco history on file. She reports that she does not drink alcohol or use illicit drugs. family history includes Alzheimer's disease in her mother; Deep vein thrombosis in her father; Diabetes in her sister; and Hip fracture in her mother. Allergies    Allergen Reactions  . Amlodipine Besy-Benazepril Hcl     Objective:   Physical Exam  Constitutional: She is oriented to person, place, and time. She appears well-developed and well-nourished. No distress.  HENT:  Head: Normocephalic and atraumatic.  Right Ear: External ear normal.  Left Ear: External ear normal.  Nose: Nose normal.  Mouth/Throat: Oropharynx is clear and moist.  Eyes: Conjunctivae and EOM are normal. Pupils are equal, round, and reactive to light.  Neck: Normal range of motion. Neck supple. No JVD present. No tracheal deviation present. No thyromegaly present.  Cardiovascular: Normal rate, regular rhythm, normal heart sounds and intact distal pulses.   No murmur heard. Pulmonary/Chest: Effort normal and breath sounds normal. She has no wheezes. She exhibits no tenderness.  Abdominal: Soft. Bowel sounds are normal.  Musculoskeletal: Normal range of motion. She exhibits no edema and no tenderness.  Lymphadenopathy:    She has no cervical adenopathy.  Neurological: She is alert and oriented to person, place, and time. She has normal reflexes. No cranial nerve deficit.  Skin: Skin is warm and dry. She is not diaphoretic.  Psychiatric: She has a normal mood and affect. Her behavior is normal.          Assessment & Plan:

## 2010-04-27 NOTE — Assessment & Plan Note (Signed)
This is stable on antihypertensive medication samples of basal to get into the

## 2010-04-27 NOTE — Assessment & Plan Note (Signed)
We will measure a hemoglobin A1c and lipid

## 2010-04-27 NOTE — Assessment & Plan Note (Signed)
Patient is on multivitamin without iron and we'll monitor her iron level and her CBC today

## 2010-06-12 NOTE — Discharge Summary (Signed)
Ann Washington, Ann Washington NO.:  [id number]   MEDICAL RECORD NO.:  [id number]          PATIENT TYPE:  INP   LOCATION:  5706                         FACILITY:  [facility]   PHYSICIAN:  Thomos Lemons, D.O. [facility]   DATE OF BIRTH:  02-May-1950   DATE OF ADMISSION:  08/29/2004  DATE OF DISCHARGE:  09/01/2004                                 DISCHARGE SUMMARY   DISCHARGE DIAGNOSES:  1.  Major depression with severe single episode.  2.  Anxiety.  3.  Hypertension.   HISTORY OF PRESENT ILLNESS:  The patient is a 60 year old female who was  admitted with recent history of depression which was manifested by worsening  insomnia.  The patient was admitted after overdose of Temazepam.  The  patient was extremely lethargic at the time of admission, however, was awake  and alert enough to tell admitting physician she wanted to do away with  herself.  The patient was admitted for further evaluation and placed on 1:1  with a sitter.   PAST MEDICAL HISTORY:  1.  Hypertension.  2.  Depression/anxiety.  3.  Insomnia.  4.  Increased cholesterol.  5.  Chronic tinnitus.   HOSPITAL COURSE:  Problem 1. Major depression with suicidal ideation.  The  patient was admitted and placed on Ativan to avoid withdrawal from  benzodiazepine.  A psychiatric consult was obtained.  The patient was seen  by Dr. Jeanie Sewer.  Dr. Jeanie Sewer felt that the patient was no longer at risk  to herself.  He recommended inpatient psychiatry, however, the patient  declined and was not committable.  As a result he suggested intensive  outpatient psychiatric therapy and a trial of Lexapro 10 mg daily along with  Ativan p.r.n. at bedtime for insomnia.  He cleared the patient from a  psychiatric standpoint for discharge.  Problem 2. Hypertension.  The patient was previously on Lotrel, however,  complained if caused swelling in her lower extremities and was therefore  noncompliant with BP medications prior to admission.  The  patient was  started on Avapro and her pressure has come down to 154/84.  This will need  continued outpatient monitoring.   LABORATORY DATA AT DISCHARGE:  Sodium 139, potassium 3.5, BUN 13, creatinine  0.8.  Urine drug tox is positive for benzodiazepines.  The patient had a  urine culture which grew 30,000 colonies, multiple bacterial morpho types.  The patient has no urinary symptoms at this time.  Will need to be repeated  should the patient develop dysuria.   DISCHARGE MEDICATIONS:  1.  Lexapro 10 mg p.o. daily.  2.  Ativan 0.5 mg 1-3 tablets at bedtime as needed for insomnia.  A      prescription for 14 tablets was given to the patient with one refill.      In addition, a 30-day supply with no refills of Lexapro was given to the      patient.  3.  Avapro 75 mg daily for blood pressure.  Prescription given for 30      tablets.   FOLLOW UP:  The patient is to followup with  Dr. Darryll Capers on September 08, 2004 at 9 o'clock a.m.  In addition, social work is arranging outpatient  psychiatry prior to discharge.      Melissa S. Peggyann Juba, NP      Thomos Lemons, D.O. [facility]  Electronically Signed    MSO/MEDQ  D:  09/01/2004  T:  09/02/2004  Job:  [id number]   cc:   Stacie Glaze, M.D. [facility]  [address] [place]  [place] [address]

## 2010-06-12 NOTE — Procedures (Signed)
NAMEAMORETTE, CHARRETTE NO.:  [id number]   MEDICAL RECORD NO.:  [id number]          PATIENT TYPE:  OUT   LOCATION:  SLEEP CENTER                 FACILITY:  [facility]   PHYSICIAN:  Marcelyn Bruins, M.D. [facility] DATE OF BIRTH:  1950-09-03   DATE OF STUDY:  12/28/2004                              NOCTURNAL POLYSOMNOGRAM   REFERRING PHYSICIAN:  Dr. Darryll Capers.   DATE OF STUDY:  December 28, 2004.   INDICATION FOR THE STUDY:  Persistent disorder of initiating and maintaining  sleep.   EPWORTH SCORE:  Three.   SLEEP ARCHITECTURE:  The patient had a total sleep time of 249 minutes with  very little REM and never achieved slow wave sleep. Sleep onset latency was  normal and REM onset was prolonged. Sleep efficiency was very poor at 62%.   RESPIRATORY DATA:  The patient was found to have 59 hypopneas and 3 apneas  for a Respiratory Disturbance Index of 15 events per hour. The events were  clearly worse in the supine position and there was moderate snoring noted.   OXYGEN DATA:  The patient had an O2 desaturation as low as 84% associated  with her obstructive events.   CARDIAC DATA:  No clinically significant cardiac arrhythmias.   MOVEMENT/PARASOMNIA:  There were no clinically significant periodic leg  movements or abnormal behaviors noted.   IMPRESSION/RECOMMENDATION:  Mild obstructive sleep apnea/hypopnea syndrome  with a Respiratory Disturbance Index of 15 events per hour and oxygen  desaturation as low as 84%. Treatment for this degree of sleep apnea may  include weight loss alone if clinically applicable, upper airway surgery,  oral appliance, and continuous positive airway pressure. Clinical  correlation is suggested.           ______________________________  Marcelyn Bruins, M.D. [facility]  Diplomate, American Board of Sleep  Medicine    KC/MEDQ  D:  01/05/2005 17:34:28  T:  01/06/2005 20:22:31  Job:  [id number]
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
## 2010-06-24 ENCOUNTER — Other Ambulatory Visit: Payer: Self-pay | Admitting: *Deleted

## 2010-06-24 MED ORDER — ESCITALOPRAM OXALATE 20 MG PO TABS: 10.0000 mg | ORAL_TABLET | Freq: Every day | ORAL | Status: DC

## 2010-07-24 ENCOUNTER — Other Ambulatory Visit: Payer: Self-pay | Admitting: Internal Medicine

## 2010-09-02 ENCOUNTER — Other Ambulatory Visit: Payer: Self-pay | Admitting: Dermatology

## 2010-09-25 ENCOUNTER — Other Ambulatory Visit: Payer: Self-pay | Admitting: *Deleted

## 2010-09-25 MED ORDER — ZOLPIDEM TARTRATE 10 MG PO TABS: 10.0000 mg | ORAL_TABLET | Freq: Every evening | ORAL | Status: DC | PRN

## 2010-11-19 ENCOUNTER — Other Ambulatory Visit: Payer: Self-pay | Admitting: *Deleted

## 2010-11-19 ENCOUNTER — Telehealth: Payer: Self-pay | Admitting: Internal Medicine

## 2010-11-19 MED ORDER — AMLODIPINE-OLMESARTAN 5-20 MG PO TABS: 1.0000 | ORAL_TABLET | Freq: Every day | ORAL | Status: DC

## 2010-11-19 NOTE — Telephone Encounter (Signed)
Pt had samples of azor 5-20mg . Please call rx [facility] [phone]

## 2010-12-11 ENCOUNTER — Encounter: Payer: Self-pay | Admitting: Internal Medicine

## 2010-12-11 ENCOUNTER — Ambulatory Visit (INDEPENDENT_AMBULATORY_CARE_PROVIDER_SITE_OTHER): Payer: 59 | Admitting: Internal Medicine

## 2010-12-11 DIAGNOSIS — Z0000 Encounter for general adult medical examination without abnormal findings: Secondary | ICD-10-CM

## 2010-12-11 DIAGNOSIS — I10 Essential (primary) hypertension: Secondary | ICD-10-CM

## 2010-12-11 DIAGNOSIS — R7309 Other abnormal glucose: Secondary | ICD-10-CM

## 2010-12-11 DIAGNOSIS — R739 Hyperglycemia, unspecified: Secondary | ICD-10-CM

## 2010-12-11 LAB — CBC WITH DIFFERENTIAL/PLATELET
Eosinophils Absolute: 0.3 10*3/uL (ref 0.0–0.7)
Eosinophils Relative: 5.1 % — ABNORMAL HIGH (ref 0.0–5.0)
HCT: 37.2 % (ref 36.0–46.0)
Lymphs Abs: 2.2 10*3/uL (ref 0.7–4.0)
MCHC: 34.2 g/dL (ref 30.0–36.0)
MCV: 92.1 fl (ref 78.0–100.0)
Monocytes Absolute: 0.6 10*3/uL (ref 0.1–1.0)
Neutrophils Relative %: 49.3 % (ref 43.0–77.0)
Platelets: 305 10*3/uL (ref 150.0–400.0)
WBC: 6.3 10*3/uL (ref 4.5–10.5)

## 2010-12-11 LAB — HEPATIC FUNCTION PANEL
Albumin: 4.5 g/dL (ref 3.5–5.2)
Total Protein: 8.2 g/dL (ref 6.0–8.3)

## 2010-12-11 LAB — BASIC METABOLIC PANEL
BUN: 25 mg/dL — ABNORMAL HIGH (ref 6–23)
CO2: 27 mEq/L (ref 19–32)
Calcium: 9.9 mg/dL (ref 8.4–10.5)
Creatinine, Ser: 0.9 mg/dL (ref 0.4–1.2)
GFR: 64.14 mL/min (ref >60.00)
Glucose, Bld: 87 mg/dL (ref 70–99)
Sodium: 142 mEq/L (ref 135–145)

## 2010-12-11 LAB — LIPID PANEL
Cholesterol: 178 mg/dL (ref 0–200)
HDL: 53.6 mg/dL (ref >39.00)
Triglycerides: 70 mg/dL (ref 0.0–149.0)
VLDL: 14 mg/dL (ref 0.0–40.0)

## 2010-12-11 LAB — TSH: TSH: 4.49 u[IU]/mL (ref 0.35–5.50)

## 2010-12-11 MED ORDER — AMLODIPINE-OLMESARTAN 5-40 MG PO TABS: 1.0000 | ORAL_TABLET | Freq: Every day | ORAL | Status: DC

## 2010-12-11 NOTE — Progress Notes (Signed)
Subjective:    Patient ID: Ann Washington, female    DOB: Jun 03, 1950, 60 y.o.   MRN: [id number]  HPI  CPX Had stopped exercise Weight gain of 5 lbs   Review of Systems  Constitutional: Negative for activity change, appetite change and fatigue.  HENT: Negative for ear pain, congestion, neck pain, postnasal drip and sinus pressure.   Eyes: Negative for redness and visual disturbance.  Respiratory: Negative for cough, shortness of breath and wheezing.   Gastrointestinal: Negative for abdominal pain and abdominal distention.  Genitourinary: Negative for dysuria, frequency and menstrual problem.  Musculoskeletal: Negative for myalgias, joint swelling and arthralgias.  Skin: Negative for rash and wound.  Neurological: Negative for dizziness, weakness and headaches.  Hematological: Negative for adenopathy. Does not bruise/bleed easily.  Psychiatric/Behavioral: Negative for sleep disturbance and decreased concentration.   Past Medical History  Diagnosis Date  . Depression   . GERD (gastroesophageal reflux disease)   . Hyperlipidemia   . Hypertension     History   Social History  . Marital Status: Married    Spouse Name: N/A    Number of Children: N/A  . Years of Education: N/A   Occupational History  . Not on file.   Social History Main Topics  . Smoking status: Never Smoker   . Smokeless tobacco: Not on file  . Alcohol Use: No  . Drug Use: No  . Sexually Active: Yes   Other Topics Concern  . Not on file   Social History Narrative  . No narrative on file    No past surgical history on file.  Family History  Problem Relation Age of Onset  . Diabetes Sister   . Alzheimer's disease Mother   . Hip fracture Mother   . Deep vein thrombosis Father     Allergies  Allergen Reactions  . Amlodipine Besy-Benazepril Hcl     Current Outpatient Prescriptions on File Prior to Visit  Medication Sig Dispense Refill  . Calcium Carbonate-Vit D-Min 600-400 MG-UNIT TABS  Take by mouth daily.        Marland Kitchen escitalopram (LEXAPRO) 20 MG tablet Take 0.5 tablets (10 mg total) by mouth daily.  30 tablet  6  . fish oil-omega-3 fatty acids 1000 MG capsule Take 2 g by mouth daily.        . lansoprazole (PREVACID) 15 MG capsule Take 15 mg by mouth daily.        Marland Kitchen LIPOFEN 150 MG CAPS TAKE 1 CAPSULE EVERY DAY  30 capsule  5  . Multiple Vitamin (MULTIVITAMIN) tablet Take 1 tablet by mouth daily.        . travoprost, benzalkonium, (TRAVATAN) 0.004 % ophthalmic solution Place 1 drop into both eyes at bedtime.        Marland Kitchen zolpidem (AMBIEN) 10 MG tablet Take 1 tablet (10 mg total) by mouth at bedtime as needed.  30 tablet  3    BP 140/80  Pulse 76  Temp 98.2 F (36.8 C)  Resp 16  Ht 5\' 5"  (1.651 m)  Wt 180 lb (81.647 kg)  BMI 29.95 kg/m2        Objective:   Physical Exam  Nursing note and vitals reviewed. Constitutional: She is oriented to person, place, and time. She appears well-developed and well-nourished. No distress.  HENT:  Head: Normocephalic and atraumatic.  Right Ear: External ear normal.  Left Ear: External ear normal.  Nose: Nose normal.  Mouth/Throat: Oropharynx is clear and moist.  Eyes: Conjunctivae and EOM  are normal. Pupils are equal, round, and reactive to light.  Neck: Normal range of motion. Neck supple. No JVD present. No tracheal deviation present. No thyromegaly present.  Cardiovascular: Normal rate, regular rhythm, normal heart sounds and intact distal pulses.   No murmur heard. Pulmonary/Chest: Effort normal and breath sounds normal. She has no wheezes. She exhibits no tenderness.  Abdominal: Soft. Bowel sounds are normal.  Musculoskeletal: Normal range of motion. She exhibits no edema and no tenderness.  Lymphadenopathy:    She has no cervical adenopathy.  Neurological: She is alert and oriented to person, place, and time. She has normal reflexes. No cranial nerve deficit.  Skin: Skin is warm and dry. She is not diaphoretic.  Psychiatric:  She has a normal mood and affect. Her behavior is normal.          Assessment & Plan:   This is a routine physical examination for this healthy  Female. Reviewed all health maintenance protocols including mammography colonoscopy bone density and reviewed appropriate screening labs. Her immunization history was reviewed as well as her current medications and allergies refills of her chronic medications were given and the plan for yearly health maintenance was discussed all orders and referrals were made as appropriate.  Blood pressure elevation  Change to azor 4/40 Mood stable Monitor DM

## 2010-12-11 NOTE — Patient Instructions (Signed)
The patient is instructed to continue all medications as prescribed. Schedule followup with check out clerk upon leaving the clinic  

## 2010-12-12 LAB — VITAMIN D 25 HYDROXY (VIT D DEFICIENCY, FRACTURES): Vit D, 25-Hydroxy: 35 ng/mL (ref 30–89)

## 2011-01-22 ENCOUNTER — Other Ambulatory Visit: Payer: Self-pay | Admitting: Internal Medicine

## 2011-01-22 MED ORDER — ZOLPIDEM TARTRATE 10 MG PO TABS: 10.0000 mg | ORAL_TABLET | Freq: Every evening | ORAL | Status: DC | PRN

## 2011-01-22 NOTE — Telephone Encounter (Signed)
Pt called and is req refill of zolpidem (AMBIEN) 10 MG tablet to [facility].

## 2011-01-22 NOTE — Telephone Encounter (Signed)
done

## 2011-01-26 LAB — HM COLONOSCOPY

## 2011-01-27 ENCOUNTER — Other Ambulatory Visit: Payer: Self-pay | Admitting: Internal Medicine

## 2011-02-15 ENCOUNTER — Ambulatory Visit (INDEPENDENT_AMBULATORY_CARE_PROVIDER_SITE_OTHER): Payer: 59 | Admitting: Family

## 2011-02-15 ENCOUNTER — Encounter: Payer: Self-pay | Admitting: Family

## 2011-02-15 VITALS — BP 130/84 | Temp 99.9°F | Wt 181.0 lb

## 2011-02-15 DIAGNOSIS — R05 Cough: Secondary | ICD-10-CM

## 2011-02-15 DIAGNOSIS — J209 Acute bronchitis, unspecified: Secondary | ICD-10-CM

## 2011-02-15 MED ORDER — GUAIFENESIN-CODEINE 100-10 MG/5ML PO SYRP: 5.0000 mL | ORAL_SOLUTION | Freq: Three times a day (TID) | ORAL | Status: DC | PRN

## 2011-02-15 MED ORDER — PREDNISONE 20 MG PO TABS: 60.0000 mg | ORAL_TABLET | Freq: Every day | ORAL | Status: AC

## 2011-02-15 NOTE — Progress Notes (Signed)
Subjective:    Patient ID: Ann Washington, female    DOB: Sep 17, 1950, 61 y.o.   MRN: [id number]  HPI Comments: C/o constant hacking, nonproductive cough, sorethroat, sinus drainage, chills and low grade fever since Fri. Robitussin, mucinex dm, and vicks cold fusion OTC ineffective. Nonsmoker, does not live with smoker.   Cough Associated symptoms include chills and postnasal drip. Pertinent negatives include no ear pain, shortness of breath or wheezing.      Review of Systems  Constitutional: Positive for chills and fatigue. Negative for activity change and appetite change.  HENT: Positive for congestion, postnasal drip and sinus pressure. Negative for ear pain, nosebleeds, sneezing and ear discharge.   Eyes: Negative for discharge and itching.  Respiratory: Negative for apnea, choking, chest tightness, shortness of breath, wheezing and stridor.   Cardiovascular: Negative.    Past Medical History  Diagnosis Date  . Depression   . GERD (gastroesophageal reflux disease)   . Hyperlipidemia   . Hypertension     History   Social History  . Marital Status: Married    Spouse Name: N/A    Number of Children: N/A  . Years of Education: N/A   Occupational History  . Not on file.   Social History Main Topics  . Smoking status: Never Smoker   . Smokeless tobacco: Not on file  . Alcohol Use: No  . Drug Use: No  . Sexually Active: Yes   Other Topics Concern  . Not on file   Social History Narrative  . No narrative on file    No past surgical history on file.  Family History  Problem Relation Age of Onset  . Diabetes Sister   . Alzheimer's disease Mother   . Hip fracture Mother   . Deep vein thrombosis Father     Allergies  Allergen Reactions  . Amlodipine Besy-Benazepril Hcl     Current Outpatient Prescriptions on File Prior to Visit  Medication Sig Dispense Refill  . amLODipine-olmesartan (AZOR) 5-40 MG per tablet Take 1 tablet by mouth daily.  30 tablet  11   . Calcium Carbonate-Vit D-Min 600-400 MG-UNIT TABS Take by mouth daily.        Marland Kitchen escitalopram (LEXAPRO) 20 MG tablet Take 0.5 tablets (10 mg total) by mouth daily.  30 tablet  6  . fish oil-omega-3 fatty acids 1000 MG capsule Take 2 g by mouth daily.        Marland Kitchen LIPOFEN 150 MG CAPS TAKE 1 CAPSULE EVERY DAY  30 capsule  5  . Multiple Vitamin (MULTIVITAMIN) tablet Take 1 tablet by mouth daily.        . travoprost, benzalkonium, (TRAVATAN) 0.004 % ophthalmic solution Place 1 drop into both eyes at bedtime.        Marland Kitchen zolpidem (AMBIEN) 10 MG tablet Take 1 tablet (10 mg total) by mouth at bedtime as needed.  30 tablet  3  . lansoprazole (PREVACID) 15 MG capsule Take 15 mg by mouth daily.          BP 130/84  Temp(Src) 99.9 F (37.7 C) (Oral)  Wt 181 lb (82.101 kg)chart    Objective:   Physical Exam  Constitutional: She is oriented to person, place, and time. She appears well-developed and well-nourished.  Cardiovascular: Normal rate, regular rhythm, normal heart sounds and intact distal pulses.  Exam reveals no gallop and no friction rub.   No murmur heard. Pulmonary/Chest: Effort normal and breath sounds normal. No respiratory distress. She has no wheezes.  She has no rales. She exhibits no tenderness.  Neurological: She is alert and oriented to person, place, and time.  Skin: Skin is warm and dry.  Psychiatric: She has a normal mood and affect.          Assessment & Plan:  Assessment Acute bronchitis  Plan: rest, increase po fluids, humidifier as needed, hycodan cough syrup with codeine. Tylenol otc prn for fever or body aches

## 2011-02-15 NOTE — Patient Instructions (Signed)

## 2011-02-17 ENCOUNTER — Telehealth: Payer: Self-pay | Admitting: *Deleted

## 2011-02-17 MED ORDER — FLUTICASONE PROPIONATE 50 MCG/ACT NA SUSP: 2.0000 | Freq: Every day | NASAL | Status: DC

## 2011-02-17 NOTE — Telephone Encounter (Signed)
Rx sent to pharmacy   

## 2011-02-17 NOTE — Telephone Encounter (Signed)
Pt is asking for RX for nasal congestion. Her nasal passages are completely stopped up.

## 2011-02-17 NOTE — Telephone Encounter (Signed)
Flonase 2 sprays in each nostril QD

## 2011-02-18 ENCOUNTER — Telehealth: Payer: Self-pay | Admitting: *Deleted

## 2011-02-18 MED ORDER — AZITHROMYCIN 250 MG PO TABS: ORAL_TABLET | ORAL | Status: AC

## 2011-02-18 NOTE — Telephone Encounter (Signed)
Notified pt. 

## 2011-02-18 NOTE — Telephone Encounter (Signed)
Zpak as directed no refills.

## 2011-02-18 NOTE — Telephone Encounter (Signed)
Pt continues to have a deep cough, and would like an antibiotic, please.

## 2011-02-24 ENCOUNTER — Ambulatory Visit (INDEPENDENT_AMBULATORY_CARE_PROVIDER_SITE_OTHER): Payer: 59 | Admitting: Family Medicine

## 2011-02-24 ENCOUNTER — Encounter: Payer: Self-pay | Admitting: Family Medicine

## 2011-02-24 VITALS — BP 142/90 | Temp 99.3°F | Wt 181.0 lb

## 2011-02-24 DIAGNOSIS — R05 Cough: Secondary | ICD-10-CM

## 2011-02-24 MED ORDER — HYDROCODONE-HOMATROPINE 5-1.5 MG/5ML PO SYRP: 5.0000 mL | ORAL_SOLUTION | Freq: Four times a day (QID) | ORAL | Status: AC | PRN

## 2011-02-24 NOTE — Patient Instructions (Signed)
Follow up for any fever or worsening symptoms. 

## 2011-02-24 NOTE — Progress Notes (Signed)
  Subjective:    Patient ID: Ann Washington, female    DOB: [birth year], 61 y.o.   MRN: [id number]  HPI  Pt seen with persistent cough. Onset about 12 days ago. Refer to prior note. She's had mostly nonproductive cough. Was given prednisone for 5 days and also codeine cough syrup and subsequently Zithromax. She feels about the same. Cough remains mostly nonproductive. No hemoptysis. No definite fever. No chills. No dyspnea. No obvious wheezing. She also tried several over-the-counter medications without much improvement. She has had some nasal congestion. Nonsmoker.   Review of Systems As per history of present illness    Objective:   Physical Exam  Constitutional: She appears well-developed and well-nourished. No distress.  HENT:  Right Ear: External ear normal.  Left Ear: External ear normal.  Mouth/Throat: Oropharynx is clear and moist.  Neck: Neck supple. No thyromegaly present.  Cardiovascular: Normal rate and regular rhythm.   Pulmonary/Chest: Effort normal and breath sounds normal. No respiratory distress. She has no wheezes. She has no rales.  Musculoskeletal: She exhibits no edema.  Lymphadenopathy:    She has no cervical adenopathy.          Assessment & Plan:  Persistent cough. Suspect acute viral bronchitis. No further antibiotics.  Hycodan for cough suppression.

## 2011-04-27 ENCOUNTER — Other Ambulatory Visit: Payer: Self-pay | Admitting: *Deleted

## 2011-04-27 MED ORDER — ZOLPIDEM TARTRATE 10 MG PO TABS: 10.0000 mg | ORAL_TABLET | Freq: Every evening | ORAL | Status: DC | PRN

## 2011-05-19 ENCOUNTER — Other Ambulatory Visit: Payer: Self-pay | Admitting: Obstetrics and Gynecology

## 2011-05-19 ENCOUNTER — Encounter: Payer: Self-pay | Admitting: Gynecology

## 2011-05-19 DIAGNOSIS — Z1231 Encounter for screening mammogram for malignant neoplasm of breast: Secondary | ICD-10-CM

## 2011-05-19 DIAGNOSIS — N870 Mild cervical dysplasia: Secondary | ICD-10-CM | POA: Insufficient documentation

## 2011-05-24 ENCOUNTER — Ambulatory Visit
Admission: RE | Admit: 2011-05-24 | Discharge: 2011-05-24 | Disposition: A | Discharge status: Home / Self Care | Payer: 59 | Source: Ambulatory Visit | Attending: Obstetrics and Gynecology | Admitting: Obstetrics and Gynecology

## 2011-05-24 DIAGNOSIS — Z1231 Encounter for screening mammogram for malignant neoplasm of breast: Secondary | ICD-10-CM

## 2011-05-27 ENCOUNTER — Other Ambulatory Visit (HOSPITAL_COMMUNITY)
Admission: RE | Admit: 2011-05-27 | Discharge: 2011-05-27 | Disposition: A | Discharge status: Home / Self Care | Payer: 59 | Source: Ambulatory Visit | Attending: Obstetrics and Gynecology | Admitting: Obstetrics and Gynecology

## 2011-05-27 ENCOUNTER — Ambulatory Visit (INDEPENDENT_AMBULATORY_CARE_PROVIDER_SITE_OTHER): Payer: 59 | Admitting: Obstetrics and Gynecology

## 2011-05-27 ENCOUNTER — Encounter: Payer: Self-pay | Admitting: Obstetrics and Gynecology

## 2011-05-27 VITALS — BP 138/84 | Ht 62.0 in | Wt 178.0 lb

## 2011-05-27 DIAGNOSIS — H409 Unspecified glaucoma: Secondary | ICD-10-CM | POA: Insufficient documentation

## 2011-05-27 DIAGNOSIS — Z01419 Encounter for gynecological examination (general) (routine) without abnormal findings: Secondary | ICD-10-CM

## 2011-05-27 DIAGNOSIS — N952 Postmenopausal atrophic vaginitis: Secondary | ICD-10-CM

## 2011-05-27 MED ORDER — ESTRADIOL 0.1 MG/GM VA CREA: TOPICAL_CREAM | VAGINAL | Status: DC

## 2011-05-27 NOTE — Progress Notes (Signed)
Patient came to see me today for her annual GYN exam. She is doing well gynecologically except for dyspareunia due to vaginal dryness. She does have a normal mammogram. She had normal bone density in  2011. She is having no vaginal bleeding. She is having no pelvic pain. She was treated for CIN with cryosurgery greater and 20 years ago. She does her lab through PCP.  Physical examination: Kennon Portela present. HEENT within normal limits. Neck: Thyroid not large. No masses. Supraclavicular nodes: not enlarged. Breasts: Examined in both sitting and lying  position. No skin changes and no masses. Abdomen: Soft no guarding rebound or masses or hernia. Pelvic: External: Within normal limits. BUS: Within normal limits. Vaginal:within normal limits. Poor  estrogen effect. No evidence of cystocele rectocele or enterocele. Cervix: clean. Uterus: Normal size and shape. Adnexa: No masses. Rectovaginal exam: Confirmatory and negative. Extremities: Within normal limits.  Assessment: Symptomatic atrophic vaginitis  Plan: Estrace vaginal cream 1 g at bedtime in vagina 3 times a week.

## 2011-06-11 ENCOUNTER — Encounter: Payer: Self-pay | Admitting: Internal Medicine

## 2011-06-11 ENCOUNTER — Ambulatory Visit (INDEPENDENT_AMBULATORY_CARE_PROVIDER_SITE_OTHER): Payer: 59 | Admitting: Internal Medicine

## 2011-06-11 VITALS — BP 140/80 | HR 76 | Temp 98.3°F | Resp 16 | Ht 63.0 in | Wt 175.0 lb

## 2011-06-11 DIAGNOSIS — T887XXA Unspecified adverse effect of drug or medicament, initial encounter: Secondary | ICD-10-CM

## 2011-06-11 DIAGNOSIS — I10 Essential (primary) hypertension: Secondary | ICD-10-CM

## 2011-06-11 DIAGNOSIS — R635 Abnormal weight gain: Secondary | ICD-10-CM

## 2011-06-11 NOTE — Patient Instructions (Signed)
paleo diet  And look up practical paleo

## 2011-06-11 NOTE — Progress Notes (Signed)
Subjective:    Patient ID: Ann Washington, female    DOB: 02-25-1950, 61 y.o.   MRN: [id number]  HPI The Patient is seen for HTN, lipid review and GERD discussion of the paleo diet    Review of Systems  Constitutional: Negative for activity change, appetite change and fatigue.  HENT: Negative for ear pain, congestion, neck pain, postnasal drip and sinus pressure.   Eyes: Negative for redness and visual disturbance.  Respiratory: Negative for cough, shortness of breath and wheezing.   Gastrointestinal: Negative for abdominal pain and abdominal distention.  Genitourinary: Negative for dysuria, frequency and menstrual problem.  Musculoskeletal: Negative for myalgias, joint swelling and arthralgias.  Skin: Negative for rash and wound.  Neurological: Negative for dizziness, weakness and headaches.  Hematological: Negative for adenopathy. Does not bruise/bleed easily.  Psychiatric/Behavioral: Negative for sleep disturbance and decreased concentration.   Past Medical History  Diagnosis Date  . Depression   . GERD (gastroesophageal reflux disease)   . Hyperlipidemia   . Hypertension   . CIN I (cervical intraepithelial neoplasia I)   . Glaucoma     History   Social History  . Marital Status: Married    Spouse Name: N/A    Number of Children: N/A  . Years of Education: N/A   Occupational History  . Not on file.   Social History Main Topics  . Smoking status: Never Smoker   . Smokeless tobacco: Not on file  . Alcohol Use: No  . Drug Use: No  . Sexually Active: Yes    Birth Control/ Protection: Surgical   Other Topics Concern  . Not on file   Social History Narrative  . No narrative on file    Past Surgical History  Procedure Date  . Colposcopy   . Gynecologic cryosurgery   . Tubal ligation     Family History  Problem Relation Age of Onset  . Diabetes Sister   . Breast cancer Sister     Age 53  . Hypertension Sister   . Alzheimer's disease Mother   .  Hip fracture Mother   . Osteoporosis Mother   . Deep vein thrombosis Father   . Heart disease Father   . Hypertension Brother     Allergies  Allergen Reactions  . Amlodipine Besy-Benazepril Hcl     Current Outpatient Prescriptions on File Prior to Visit  Medication Sig Dispense Refill  . amLODipine-olmesartan (AZOR) 5-40 MG per tablet Take 1 tablet by mouth daily.  30 tablet  11  . Calcium Carbonate-Vit D-Min 600-400 MG-UNIT TABS Take by mouth daily.        Marland Kitchen escitalopram (LEXAPRO) 20 MG tablet Take 0.5 tablets (10 mg total) by mouth daily.  30 tablet  6  . estradiol (ESTRACE) 0.1 MG/GM vaginal cream Use 1 g in vagina 3 times a week.  42.5 g  6  . fish oil-omega-3 fatty acids 1000 MG capsule Take 2 g by mouth daily.        . fluticasone (FLONASE) 50 MCG/ACT nasal spray Place 2 sprays into the nose daily.  16 g  0  . LIPOFEN 150 MG CAPS TAKE 1 CAPSULE EVERY DAY  30 capsule  5  . Multiple Vitamin (MULTIVITAMIN) tablet Take 1 tablet by mouth daily.        Marland Kitchen omeprazole (PRILOSEC) 20 MG capsule Take 20 mg by mouth daily.      . travoprost, benzalkonium, (TRAVATAN) 0.004 % ophthalmic solution Place 1 drop into both eyes at  bedtime.        Marland Kitchen zolpidem (AMBIEN) 10 MG tablet Take 1 tablet (10 mg total) by mouth at bedtime as needed.  30 tablet  5    BP 140/80  Pulse 76  Temp 98.3 F (36.8 C)  Resp 16  Ht 5\' 3"  (1.6 m)  Wt 175 lb (79.379 kg)  BMI 31.00 kg/m2       Objective:   Physical Exam  Nursing note and vitals reviewed. Constitutional: She is oriented to person, place, and time. She appears well-developed and well-nourished. No distress.  HENT:  Head: Normocephalic and atraumatic.  Right Ear: External ear normal.  Left Ear: External ear normal.  Nose: Nose normal.  Mouth/Throat: Oropharynx is clear and moist.  Eyes: Conjunctivae and EOM are normal. Pupils are equal, round, and reactive to light.  Neck: Normal range of motion. Neck supple. No JVD present. No tracheal  deviation present. No thyromegaly present.  Cardiovascular: Normal rate, regular rhythm, normal heart sounds and intact distal pulses.   No murmur heard. Pulmonary/Chest: Effort normal and breath sounds normal. She has no wheezes. She exhibits no tenderness.  Abdominal: Soft. Bowel sounds are normal.  Musculoskeletal: Normal range of motion. She exhibits no edema and no tenderness.  Lymphadenopathy:    She has no cervical adenopathy.  Neurological: She is alert and oriented to person, place, and time. She has normal reflexes. No cranial nerve deficit.  Skin: Skin is warm and dry. She is not diaphoretic.  Psychiatric: She has a normal mood and affect. Her behavior is normal.          Assessment & Plan:  The p[t hasl ost weight and we reviewed her diet and discussed the "paleo diet"  I have spent more than 30 minutes examining this patient face-to-face of which over half was spent in counseling   reviewed lipids and liver from last visit she is a goal Blood pressure is a goal

## 2011-06-26 ENCOUNTER — Other Ambulatory Visit: Payer: Self-pay | Admitting: Internal Medicine

## 2011-09-18 ENCOUNTER — Other Ambulatory Visit: Payer: Self-pay | Admitting: Internal Medicine

## 2011-10-12 ENCOUNTER — Ambulatory Visit (INDEPENDENT_AMBULATORY_CARE_PROVIDER_SITE_OTHER): Payer: 59 | Admitting: Internal Medicine

## 2011-10-12 ENCOUNTER — Encounter: Payer: Self-pay | Admitting: Internal Medicine

## 2011-10-12 VITALS — BP 140/80 | HR 76 | Temp 98.6°F | Resp 16 | Ht 63.0 in | Wt 171.0 lb

## 2011-10-12 DIAGNOSIS — E785 Hyperlipidemia, unspecified: Secondary | ICD-10-CM

## 2011-10-12 DIAGNOSIS — R7309 Other abnormal glucose: Secondary | ICD-10-CM

## 2011-10-12 DIAGNOSIS — E663 Overweight: Secondary | ICD-10-CM

## 2011-10-12 DIAGNOSIS — I10 Essential (primary) hypertension: Secondary | ICD-10-CM

## 2011-10-12 DIAGNOSIS — N393 Stress incontinence (female) (male): Secondary | ICD-10-CM | POA: Insufficient documentation

## 2011-10-12 LAB — LIPID PANEL
Cholesterol: 179 mg/dL (ref 0–200)
HDL: 47.1 mg/dL
LDL Cholesterol: 101 mg/dL — ABNORMAL HIGH (ref 0–99)
Total CHOL/HDL Ratio: 4
Triglycerides: 157 mg/dL — ABNORMAL HIGH (ref 0.0–149.0)
VLDL: 31.4 mg/dL (ref 0.0–40.0)

## 2011-10-12 LAB — BASIC METABOLIC PANEL
CO2: 25 mEq/L (ref 19–32)
Calcium: 9.3 mg/dL (ref 8.4–10.5)
Chloride: 100 mEq/L (ref 96–112)
Sodium: 138 mEq/L (ref 135–145)

## 2011-10-12 LAB — HEMOGLOBIN A1C: Hgb A1c MFr Bld: 5.8 % (ref 4.6–6.5)

## 2011-10-12 NOTE — Progress Notes (Signed)
Subjective:    Patient ID: Ann Washington, female    DOB: 01-Jan-1951, 61 y.o.   MRN: [id number]  HPI Monitoring of prediabetes hypertension hyperlipidemia.  Also medication management with refills as appropriate   Review of Systems  Constitutional: Negative for activity change, appetite change and fatigue.  HENT: Negative for ear pain, congestion, neck pain, postnasal drip and sinus pressure.   Eyes: Negative for redness and visual disturbance.  Respiratory: Negative for cough, shortness of breath and wheezing.   Gastrointestinal: Negative for abdominal pain and abdominal distention.  Genitourinary: Negative for dysuria, frequency and menstrual problem.  Musculoskeletal: Negative for myalgias, joint swelling and arthralgias.  Skin: Negative for rash and wound.  Neurological: Negative for dizziness, weakness and headaches.  Hematological: Negative for adenopathy. Does not bruise/bleed easily.  Psychiatric/Behavioral: Negative for disturbed wake/sleep cycle and decreased concentration.   Past Medical History  Diagnosis Date  . Depression   . GERD (gastroesophageal reflux disease)   . Hyperlipidemia   . Hypertension   . CIN I (cervical intraepithelial neoplasia I)   . Glaucoma     History   Social History  . Marital Status: Married    Spouse Name: N/A    Number of Children: N/A  . Years of Education: N/A   Occupational History  . Not on file.   Social History Main Topics  . Smoking status: Never Smoker   . Smokeless tobacco: Not on file  . Alcohol Use: No  . Drug Use: No  . Sexually Active: Yes    Birth Control/ Protection: Surgical   Other Topics Concern  . Not on file   Social History Narrative  . No narrative on file    Past Surgical History  Procedure Date  . Colposcopy   . Gynecologic cryosurgery   . Tubal ligation     Family History  Problem Relation Age of Onset  . Diabetes Sister   . Breast cancer Sister     Age 29  . Hypertension Sister    . Alzheimer's disease Mother   . Hip fracture Mother   . Osteoporosis Mother   . Deep vein thrombosis Father   . Heart disease Father   . Hypertension Brother     Allergies  Allergen Reactions  . Amlodipine Besy-Benazepril Hcl     Current Outpatient Prescriptions on File Prior to Visit  Medication Sig Dispense Refill  . amLODipine-olmesartan (AZOR) 5-40 MG per tablet Take 1 tablet by mouth daily.  30 tablet  11  . Calcium Carbonate-Vit D-Min 600-400 MG-UNIT TABS Take by mouth daily.        Marland Kitchen escitalopram (LEXAPRO) 20 MG tablet TAKE 1/2 TABLET EVERY DAY  30 tablet  5  . estradiol (ESTRACE) 0.1 MG/GM vaginal cream Use 1 g in vagina 3 times a week.  42.5 g  6  . fish oil-omega-3 fatty acids 1000 MG capsule Take 2 g by mouth daily.        . fluticasone (FLONASE) 50 MCG/ACT nasal spray Place 2 sprays into the nose daily.  16 g  0  . LIPOFEN 150 MG CAPS TAKE 1 CAPSULE EVERY DAY  30 capsule  5  . Multiple Vitamin (MULTIVITAMIN) tablet Take 1 tablet by mouth daily.        Marland Kitchen omeprazole (PRILOSEC) 20 MG capsule Take 20 mg by mouth daily.      . travoprost, benzalkonium, (TRAVATAN) 0.004 % ophthalmic solution Place 1 drop into both eyes at bedtime.        Marland Kitchen  zolpidem (AMBIEN) 10 MG tablet Take 1 tablet (10 mg total) by mouth at bedtime as needed.  30 tablet  5    BP 140/80  Pulse 76  Temp 98.6 F (37 C)  Resp 16  Ht 5\' 3"  (1.6 m)  Wt 171 lb (77.565 kg)  BMI 30.29 kg/m2      Objective:   Physical Exam  Nursing note and vitals reviewed. Constitutional: She is oriented to person, place, and time. She appears well-developed and well-nourished. No distress.  HENT:  Head: Normocephalic and atraumatic.  Right Ear: External ear normal.  Left Ear: External ear normal.  Nose: Nose normal.  Mouth/Throat: Oropharynx is clear and moist.  Eyes: Conjunctivae normal and EOM are normal. Pupils are equal, round, and reactive to light.  Neck: Normal range of motion. Neck supple. No JVD present.  No tracheal deviation present. No thyromegaly present.  Cardiovascular: Normal rate, regular rhythm, normal heart sounds and intact distal pulses.   No murmur heard. Pulmonary/Chest: Effort normal and breath sounds normal. She has no wheezes. She exhibits no tenderness.  Abdominal: Soft. Bowel sounds are normal.  Musculoskeletal: Normal range of motion. She exhibits no edema and no tenderness.  Lymphadenopathy:    She has no cervical adenopathy.  Neurological: She is alert and oriented to person, place, and time. She has normal reflexes. No cranial nerve deficit.  Skin: Skin is warm and dry. She is not diaphoretic.  Psychiatric: She has a normal mood and affect. Her behavior is normal.          Assessment & Plan:  The patient is a 61 year old female who has a history of prediabetes hyperlipidemia and hypertension her blood pressure rechecked today was stable.  We'll monitor basic metabolic panel a lipid panel and hemoglobin A1c for screening purposes to make sure that the blood sugars are controlled she continues to gradually lose weight purposeful manner

## 2011-10-12 NOTE — Patient Instructions (Addendum)
Use my chart access her blood work

## 2011-10-18 NOTE — Progress Notes (Signed)
Quick Note:  Left a message for return call. ______ 

## 2011-10-19 ENCOUNTER — Encounter: Payer: Self-pay | Admitting: *Deleted

## 2011-10-19 NOTE — Progress Notes (Signed)
Pt informed

## 2011-10-25 ENCOUNTER — Other Ambulatory Visit: Payer: Self-pay | Admitting: Internal Medicine

## 2012-01-13 ENCOUNTER — Other Ambulatory Visit: Payer: Self-pay | Admitting: Internal Medicine

## 2012-02-07 ENCOUNTER — Encounter: Payer: Self-pay | Admitting: Internal Medicine

## 2012-02-07 ENCOUNTER — Ambulatory Visit (INDEPENDENT_AMBULATORY_CARE_PROVIDER_SITE_OTHER): Payer: 59 | Admitting: Internal Medicine

## 2012-02-07 VITALS — BP 140/90 | HR 72 | Temp 98.0°F | Resp 16 | Ht 63.0 in | Wt 172.0 lb

## 2012-02-07 DIAGNOSIS — F5105 Insomnia due to other mental disorder: Secondary | ICD-10-CM

## 2012-02-07 DIAGNOSIS — K219 Gastro-esophageal reflux disease without esophagitis: Secondary | ICD-10-CM

## 2012-02-07 DIAGNOSIS — E785 Hyperlipidemia, unspecified: Secondary | ICD-10-CM

## 2012-02-07 DIAGNOSIS — F489 Nonpsychotic mental disorder, unspecified: Secondary | ICD-10-CM

## 2012-02-07 DIAGNOSIS — I10 Essential (primary) hypertension: Secondary | ICD-10-CM

## 2012-02-07 DIAGNOSIS — E663 Overweight: Secondary | ICD-10-CM

## 2012-02-07 DIAGNOSIS — Z0000 Encounter for general adult medical examination without abnormal findings: Secondary | ICD-10-CM

## 2012-02-07 MED ORDER — ZOLPIDEM TARTRATE 10 MG PO TABS: 10.0000 mg | ORAL_TABLET | Freq: Every evening | ORAL | Status: DC | PRN

## 2012-02-07 NOTE — Addendum Note (Signed)
Addended by: Stacie Glaze on: 02/07/2012 08:57 AM   Modules accepted: Orders

## 2012-02-07 NOTE — Patient Instructions (Signed)
The patient is instructed to continue all medications as prescribed. Schedule followup with check out clerk upon leaving the clinic  

## 2012-02-07 NOTE — Progress Notes (Signed)
Subjective:    Patient ID: Ann Washington, female    DOB: 03-01-1950, 62 y.o.   MRN: [id number]  HPI Follow up weight and HTN monitoring DM Weather interfered with exercise GERD  prilosec Lipid control   Review of Systems  Constitutional: Negative for activity change, appetite change and fatigue.  HENT: Negative for ear pain, congestion, neck pain, postnasal drip and sinus pressure.   Eyes: Negative for redness and visual disturbance.  Respiratory: Negative for cough, shortness of breath and wheezing.   Gastrointestinal: Negative for abdominal pain and abdominal distention.  Genitourinary: Negative for dysuria, frequency and menstrual problem.  Musculoskeletal: Negative for myalgias, joint swelling and arthralgias.  Skin: Negative for rash and wound.  Neurological: Negative for dizziness, weakness and headaches.  Hematological: Negative for adenopathy. Does not bruise/bleed easily.  Psychiatric/Behavioral: Negative for sleep disturbance and decreased concentration.   Past Medical History  Diagnosis Date  . Depression   . GERD (gastroesophageal reflux disease)   . Hyperlipidemia   . Hypertension   . CIN I (cervical intraepithelial neoplasia I)   . Glaucoma(365)     History   Social History  . Marital Status: Married    Spouse Name: N/A    Number of Children: N/A  . Years of Education: N/A   Occupational History  . Not on file.   Social History Main Topics  . Smoking status: Never Smoker   . Smokeless tobacco: Not on file  . Alcohol Use: No  . Drug Use: No  . Sexually Active: Yes    Birth Control/ Protection: Surgical   Other Topics Concern  . Not on file   Social History Narrative  . No narrative on file    Past Surgical History  Procedure Date  . Colposcopy   . Gynecologic cryosurgery   . Tubal ligation     Family History  Problem Relation Age of Onset  . Diabetes Sister   . Breast cancer Sister     Age 73  . Hypertension Sister   .  Alzheimer's disease Mother   . Hip fracture Mother   . Osteoporosis Mother   . Deep vein thrombosis Father   . Heart disease Father   . Hypertension Brother     Allergies  Allergen Reactions  . Amlodipine Besy-Benazepril Hcl     Current Outpatient Prescriptions on File Prior to Visit  Medication Sig Dispense Refill  . AZOR 5-40 MG per tablet TAKE 1 TABLET BY MOUTH DAILY.  30 tablet  4  . Calcium Carbonate-Vit D-Min 600-400 MG-UNIT TABS Take by mouth daily.        Marland Kitchen escitalopram (LEXAPRO) 20 MG tablet TAKE 1/2 TABLET EVERY DAY  30 tablet  5  . estradiol (ESTRACE) 0.1 MG/GM vaginal cream Use 1 g in vagina 3 times a week.  42.5 g  6  . fish oil-omega-3 fatty acids 1000 MG capsule Take 2 g by mouth daily.        . fluticasone (FLONASE) 50 MCG/ACT nasal spray Place 2 sprays into the nose daily.  16 g  0  . LIPOFEN 150 MG CAPS TAKE 1 CAPSULE EVERY DAY  30 capsule  5  . Multiple Vitamin (MULTIVITAMIN) tablet Take 1 tablet by mouth daily.        Marland Kitchen omeprazole (PRILOSEC) 20 MG capsule Take 20 mg by mouth daily.      . travoprost, benzalkonium, (TRAVATAN) 0.004 % ophthalmic solution Place 1 drop into both eyes at bedtime.        Marland Kitchen  zolpidem (AMBIEN) 10 MG tablet TAKE 1 TABLET AT BEDTIME  30 tablet  5    BP 140/90  Pulse 72  Temp 98 F (36.7 C)  Resp 16  Ht 5\' 3"  (1.6 m)  Wt 172 lb (78.019 kg)  BMI 30.47 kg/m2        Objective:   Physical Exam  Nursing note and vitals reviewed. Constitutional: She is oriented to person, place, and time. She appears well-developed and well-nourished. No distress.  HENT:  Head: Normocephalic and atraumatic.  Right Ear: External ear normal.  Left Ear: External ear normal.  Nose: Nose normal.  Mouth/Throat: Oropharynx is clear and moist.  Eyes: Conjunctivae normal and EOM are normal. Pupils are equal, round, and reactive to light.  Neck: Normal range of motion. Neck supple. No JVD present. No tracheal deviation present. No thyromegaly present.    Cardiovascular: Normal rate, regular rhythm, normal heart sounds and intact distal pulses.   No murmur heard. Pulmonary/Chest: Effort normal and breath sounds normal. She has no wheezes. She exhibits no tenderness.  Abdominal: Soft. Bowel sounds are normal.  Musculoskeletal: Normal range of motion. She exhibits no edema and no tenderness.  Lymphadenopathy:    She has no cervical adenopathy.  Neurological: She is alert and oriented to person, place, and time. She has normal reflexes. No cranial nerve deficit.  Skin: Skin is warm and dry. She is not diaphoretic.  Psychiatric: She has a normal mood and affect. Her behavior is normal.          Assessment & Plan:  Blood pressure on current medications.  Weight is still an issue for overall health maintenance I would like to see about 20 pound weight loss which would make blood pressure control more easily to achieve and also keep lipids in goal The elevation of triglycerides

## 2012-03-24 ENCOUNTER — Other Ambulatory Visit: Payer: Self-pay | Admitting: Internal Medicine

## 2012-06-12 ENCOUNTER — Encounter: Payer: Self-pay | Admitting: Gastroenterology

## 2012-06-12 ENCOUNTER — Encounter: Payer: Self-pay | Admitting: Internal Medicine

## 2012-06-12 ENCOUNTER — Other Ambulatory Visit: Payer: Self-pay

## 2012-06-12 ENCOUNTER — Ambulatory Visit (INDEPENDENT_AMBULATORY_CARE_PROVIDER_SITE_OTHER): Payer: 59 | Admitting: Internal Medicine

## 2012-06-12 VITALS — BP 140/88 | HR 72 | Temp 98.2°F | Resp 16 | Ht 63.0 in | Wt 174.0 lb

## 2012-06-12 DIAGNOSIS — Z1231 Encounter for screening mammogram for malignant neoplasm of breast: Secondary | ICD-10-CM

## 2012-06-12 DIAGNOSIS — T887XXA Unspecified adverse effect of drug or medicament, initial encounter: Secondary | ICD-10-CM

## 2012-06-12 DIAGNOSIS — E785 Hyperlipidemia, unspecified: Secondary | ICD-10-CM

## 2012-06-12 DIAGNOSIS — I10 Essential (primary) hypertension: Secondary | ICD-10-CM

## 2012-06-12 LAB — COMPREHENSIVE METABOLIC PANEL
AST: 24 U/L (ref 0–37)
Albumin: 3.9 g/dL (ref 3.5–5.2)
Alkaline Phosphatase: 39 U/L (ref 39–117)
Potassium: 4.4 mEq/L (ref 3.5–5.1)
Sodium: 137 mEq/L (ref 135–145)
Total Bilirubin: 0.4 mg/dL (ref 0.3–1.2)
Total Protein: 8.1 g/dL (ref 6.0–8.3)

## 2012-06-12 LAB — LIPID PANEL
LDL Cholesterol: 104 mg/dL — ABNORMAL HIGH (ref 0–99)
VLDL: 17 mg/dL (ref 0.0–40.0)

## 2012-06-12 NOTE — Addendum Note (Signed)
Addended by: Bonnye Fava on: 06/12/2012 09:33 AM   Modules accepted: Orders

## 2012-06-12 NOTE — Progress Notes (Signed)
Subjective:    Patient ID: Ann Washington, female    DOB: Aug 21, 1950, 62 y.o.   MRN: [id number]  HPI  Patient is a 62 year old female who presents for follow-up of hypertension hyperlipidemia with a history of gastroesophageal she'll reflux.  She has been more immobile than normal with mild weight gain due to inactivity from foot injury/pain. She is completing a course of rehabilitation after foot surgery. She has promised to resume walking then. We will monitor a complete metabolic panel to look at liver function as well as kidney function with her hypertension and potential side effects of her lipid drugs we will also monitor a lipid profile today    Review of Systems  Constitutional: Negative for activity change, appetite change and fatigue.  HENT: Negative for ear pain, congestion, neck pain, postnasal drip and sinus pressure.   Eyes: Negative for redness and visual disturbance.  Respiratory: Negative for cough, shortness of breath and wheezing.   Gastrointestinal: Negative for abdominal pain and abdominal distention.  Genitourinary: Negative for dysuria, frequency and menstrual problem.  Musculoskeletal: Negative for myalgias, joint swelling and arthralgias.  Skin: Negative for rash and wound.  Neurological: Negative for dizziness, weakness and headaches.  Hematological: Negative for adenopathy. Does not bruise/bleed easily.  Psychiatric/Behavioral: Negative for sleep disturbance and decreased concentration.   Past Medical History  Diagnosis Date  . Depression   . GERD (gastroesophageal reflux disease)   . Hyperlipidemia   . Hypertension   . CIN I (cervical intraepithelial neoplasia I)   . Glaucoma(365)     History   Social History  . Marital Status: Married    Spouse Name: N/A    Number of Children: N/A  . Years of Education: N/A   Occupational History  . Not on file.   Social History Main Topics  . Smoking status: Never Smoker   . Smokeless tobacco: Not on  file  . Alcohol Use: No  . Drug Use: No  . Sexually Active: Yes    Birth Control/ Protection: Surgical   Other Topics Concern  . Not on file   Social History Narrative  . No narrative on file    Past Surgical History  Procedure Laterality Date  . Colposcopy    . Gynecologic cryosurgery    . Tubal ligation      Family History  Problem Relation Age of Onset  . Diabetes Sister   . Breast cancer Sister     Age 15  . Hypertension Sister   . Alzheimer's disease Mother   . Hip fracture Mother   . Osteoporosis Mother   . Deep vein thrombosis Father   . Heart disease Father   . Hypertension Brother     Allergies  Allergen Reactions  . Amlodipine Besy-Benazepril Hcl     Current Outpatient Prescriptions on File Prior to Visit  Medication Sig Dispense Refill  . AZOR 5-40 MG per tablet TAKE 1 TABLET BY MOUTH DAILY.  30 tablet  4  . Calcium Carbonate-Vit D-Min 600-400 MG-UNIT TABS Take by mouth daily.        Marland Kitchen estradiol (ESTRACE) 0.1 MG/GM vaginal cream Use 1 g in vagina 3 times a week.  42.5 g  6  . fish oil-omega-3 fatty acids 1000 MG capsule Take 2 g by mouth daily.        Marland Kitchen LIPOFEN 150 MG CAPS TAKE 1 CAPSULE EVERY DAY  30 capsule  5  . Multiple Vitamin (MULTIVITAMIN) tablet Take 1 tablet by mouth daily.        Marland Kitchen  omeprazole (PRILOSEC) 20 MG capsule Take 20 mg by mouth daily.      . travoprost, benzalkonium, (TRAVATAN) 0.004 % ophthalmic solution Place 1 drop into both eyes at bedtime.        Marland Kitchen zolpidem (AMBIEN) 10 MG tablet Take 1 tablet (10 mg total) by mouth at bedtime as needed for sleep.  30 tablet  5   No current facility-administered medications on file prior to visit.    BP 140/88  Pulse 72  Temp(Src) 98.2 F (36.8 C)  Resp 16  Ht 5\' 3"  (1.6 m)  Wt 174 lb (78.926 kg)  BMI 30.83 kg/m2       Objective:   Physical Exam  Nursing note and vitals reviewed. Constitutional: She appears well-developed and well-nourished. No distress.  HENT:  Head:  Normocephalic and atraumatic.  Right Ear: External ear normal.  Left Ear: External ear normal.  Nose: Nose normal.  Mouth/Throat: Oropharynx is clear and moist.  Eyes: Conjunctivae and EOM are normal. Pupils are equal, round, and reactive to light.  Neck: Normal range of motion. Neck supple. No JVD present. No tracheal deviation present. No thyromegaly present.  Cardiovascular: Normal rate, regular rhythm, normal heart sounds and intact distal pulses.   No murmur heard. Pulmonary/Chest: Effort normal and breath sounds normal. She has no wheezes. She exhibits no tenderness.  Abdominal: Soft. Bowel sounds are normal.  Musculoskeletal: She exhibits edema and tenderness.  Lymphadenopathy:    She has no cervical adenopathy.  Neurological: She has normal reflexes. No cranial nerve deficit.  Skin: She is not diaphoretic.          Post surgery for Assessment & Plan:  Surgery for an hammertoe correction on her right foot Narrative the patient beginning walking.  Hypertension stable on current medications.  Hyperlipidemia on medication we'll monitor her lipid profile we might expect a slight bump in her lipid profile due to dietary changes and activity changes.

## 2012-06-23 ENCOUNTER — Other Ambulatory Visit: Payer: Self-pay | Admitting: Internal Medicine

## 2012-07-08 ENCOUNTER — Other Ambulatory Visit: Payer: Self-pay | Admitting: Internal Medicine

## 2012-07-13 ENCOUNTER — Ambulatory Visit
Admission: RE | Admit: 2012-07-13 | Discharge: 2012-07-13 | Disposition: A | Discharge status: Home / Self Care | Payer: 59 | Source: Ambulatory Visit

## 2012-07-13 DIAGNOSIS — Z1231 Encounter for screening mammogram for malignant neoplasm of breast: Secondary | ICD-10-CM

## 2012-07-26 ENCOUNTER — Encounter: Payer: Self-pay | Admitting: Gastroenterology

## 2012-07-26 ENCOUNTER — Ambulatory Visit (AMBULATORY_SURGERY_CENTER): Payer: 59 | Admitting: *Deleted

## 2012-07-26 VITALS — Ht 62.5 in | Wt 176.0 lb

## 2012-07-26 DIAGNOSIS — Z1211 Encounter for screening for malignant neoplasm of colon: Secondary | ICD-10-CM

## 2012-07-26 MED ORDER — MOVIPREP 100 G PO SOLR: 1.0000 | Freq: Once | ORAL | Status: DC

## 2012-07-26 NOTE — Progress Notes (Signed)
No egg or soy allergy. ewm No previous GI history. ewm No home 02 use. ewm No problems with past sedation. ewm

## 2012-08-09 ENCOUNTER — Encounter: Payer: Self-pay | Admitting: Gastroenterology

## 2012-08-09 ENCOUNTER — Ambulatory Visit (AMBULATORY_SURGERY_CENTER): Payer: 59 | Admitting: Gastroenterology

## 2012-08-09 VITALS — BP 105/60 | HR 53 | Temp 97.5°F | Resp 13 | Ht 62.0 in | Wt 176.0 lb

## 2012-08-09 DIAGNOSIS — K5730 Diverticulosis of large intestine without perforation or abscess without bleeding: Secondary | ICD-10-CM

## 2012-08-09 DIAGNOSIS — Z1211 Encounter for screening for malignant neoplasm of colon: Secondary | ICD-10-CM

## 2012-08-09 MED ORDER — SODIUM CHLORIDE 0.9 % IV SOLN: 500.0000 mL | INTRAVENOUS | Status: DC

## 2012-08-09 NOTE — Progress Notes (Signed)
Patient did not experience any of the following events: a burn prior to discharge; a fall within the facility; wrong site/side/patient/procedure/implant event; or a hospital transfer or hospital admission upon discharge from the facility. (G8907) Patient did not have preoperative order for IV antibiotic SSI prophylaxis. (G8918)  

## 2012-08-09 NOTE — Op Note (Signed)
[facility] [address] [place], [address]   COLONOSCOPY PROCEDURE REPORT  PATIENT: Ann Washington, Ann Washington  MR#: [id number] BIRTHDATE: [birth year] , 61  yrs. old GENDER: Female ENDOSCOPIST: Rachael Fee, MD PROCEDURE DATE:  08/09/2012 PROCEDURE:   Colonoscopy, screening ASA CLASS:   Class III INDICATIONS:average risk screening. MEDICATIONS: Fentanyl 50 mcg IV, Versed 6 mg IV, and These medications were titrated to patient response per physician's verbal order  DESCRIPTION OF PROCEDURE:   After the risks benefits and alternatives of the procedure were thoroughly explained, informed consent was obtained.  A digital rectal exam revealed no abnormalities of the rectum.   The [id number]  endoscope was introduced through the anus and advanced to the cecum, which was identified by both the appendix and ileocecal valve. No adverse events experienced.   The quality of the prep was good.  The instrument was then slowly withdrawn as the colon was fully examined.   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  This was most significant in left side where the mucosa was edematous and a bit tortuous.  The examination was otherwise normal. Retroflexed views revealed no abnormalities. The time to cecum=3 minutes 17 seconds.  Withdrawal time=6 minutes 49 seconds.  The scope was withdrawn and the procedure completed. COMPLICATIONS: There were no complications.  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 The examination was otherwise normal.  No polyps or cancers  RECOMMENDATIONS: You should continue to follow colorectal cancer screening guidelines for "routine risk" patients with a repeat colonoscopy in 10 years.    eSigned:  Rachael Fee, MD 08/09/2012 10:18 AM   cc: Darryll Capers, MD

## 2012-08-09 NOTE — Patient Instructions (Signed)
YOU HAD AN ENDOSCOPIC PROCEDURE TODAY AT THE [facility]: Refer to the procedure report that was given to you for any specific questions about what was found during the examination.  If the procedure report does not answer your questions, please call your gastroenterologist to clarify.  If you requested that your care partner not be given the details of your procedure findings, then the procedure report has been included in a sealed envelope for you to review at your convenience later.  YOU SHOULD EXPECT: Some feelings of bloating in the abdomen. Passage of more gas than usual.  Walking can help get rid of the air that was put into your GI tract during the procedure and reduce the bloating. If you had a lower endoscopy (such as a colonoscopy or flexible sigmoidoscopy) you may notice spotting of blood in your stool or on the toilet paper. If you underwent a bowel prep for your procedure, then you may not have a normal bowel movement for a few days.  DIET: Your first meal following the procedure should be a light meal and then it is ok to progress to your normal diet.  A half-sandwich or bowl of soup is an example of a good first meal.  Heavy or fried foods are harder to digest and may make you feel nauseous or bloated.  Likewise meals heavy in dairy and vegetables can cause extra gas to form and this can also increase the bloating.  Drink plenty of fluids but you should avoid alcoholic beverages for 24 hours.  ACTIVITY: Your care partner should take you home directly after the procedure.  You should plan to take it easy, moving slowly for the rest of the day.  You can resume normal activity the day after the procedure however you should NOT DRIVE or use heavy machinery for 24 hours (because of the sedation medicines used during the test).    SYMPTOMS TO REPORT IMMEDIATELY: A gastroenterologist can be reached at any hour.  During normal business hours, 8:30 AM to 5:00 PM Monday through Friday,  call [phone].  After hours and on weekends, please call the GI answering service at [phone] who will take a message and have the physician on call contact you.   Following lower endoscopy (colonoscopy or flexible sigmoidoscopy):  Excessive amounts of blood in the stool  Significant tenderness or worsening of abdominal pains  Swelling of the abdomen that is new, acute  Fever of 100F or higher   FOLLOW UP: If any biopsies were taken you will be contacted by phone or by letter within the next 1-3 weeks.  Call your gastroenterologist if you have not heard about the biopsies in 3 weeks.  Our staff will call the home number listed on your records the next business day following your procedure to check on you and address any questions or concerns that you may have at that time regarding the information given to you following your procedure. This is a courtesy call and so if there is no answer at the home number and we have not heard from you through the emergency physician on call, we will assume that you have returned to your regular daily activities without incident.  SIGNATURES/CONFIDENTIALITY: You and/or your care partner have signed paperwork which will be entered into your electronic medical record.  These signatures attest to the fact that that the information above on your After Visit Summary has been reviewed and is understood.  Full responsibility of the confidentiality of   this discharge information lies with you and/or your care-partner.   INFORMATION ON DIVERTICULOSIS GIVEN TO YOU TODAY 

## 2012-08-10 ENCOUNTER — Telehealth: Payer: Self-pay | Admitting: *Deleted

## 2012-08-10 NOTE — Telephone Encounter (Signed)
  Follow up Call-  Call back number 08/09/2012  Post procedure Call Back phone  # [phone]  Permission to leave phone message Yes     Patient questions:  Message left to call us if necessary.

## 2012-08-25 ENCOUNTER — Other Ambulatory Visit: Payer: Self-pay | Admitting: Internal Medicine

## 2012-09-19 ENCOUNTER — Telehealth: Payer: Self-pay | Admitting: Internal Medicine

## 2012-09-19 NOTE — Telephone Encounter (Signed)
Left message on machine None available 

## 2012-09-19 NOTE — Telephone Encounter (Signed)
Pt would like a coupon for LIPOFEN 150 MG CAPS pls mail to pt.

## 2012-11-22 ENCOUNTER — Other Ambulatory Visit: Payer: Self-pay | Admitting: Internal Medicine

## 2012-11-30 ENCOUNTER — Other Ambulatory Visit: Payer: Self-pay

## 2012-12-07 ENCOUNTER — Encounter: Payer: Self-pay | Admitting: Internal Medicine

## 2012-12-08 ENCOUNTER — Other Ambulatory Visit (INDEPENDENT_AMBULATORY_CARE_PROVIDER_SITE_OTHER): Payer: 59

## 2012-12-08 DIAGNOSIS — Z0000 Encounter for general adult medical examination without abnormal findings: Secondary | ICD-10-CM

## 2012-12-08 LAB — CBC WITH DIFFERENTIAL/PLATELET
Basophils Relative: 0.7 % (ref 0.0–3.0)
Eosinophils Absolute: 0.4 10*3/uL (ref 0.0–0.7)
Hemoglobin: 11.7 g/dL — ABNORMAL LOW (ref 12.0–15.0)
Lymphocytes Relative: 32.5 % (ref 12.0–46.0)
MCHC: 34 g/dL (ref 30.0–36.0)
Monocytes Relative: 8.9 % (ref 3.0–12.0)
Neutro Abs: 3.8 10*3/uL (ref 1.4–7.7)
Neutrophils Relative %: 51.8 % (ref 43.0–77.0)
RBC: 3.81 Mil/uL — ABNORMAL LOW (ref 3.87–5.11)
WBC: 7.3 10*3/uL (ref 4.5–10.5)

## 2012-12-08 LAB — HEPATIC FUNCTION PANEL
ALT: 18 U/L (ref 0–35)
Alkaline Phosphatase: 33 U/L — ABNORMAL LOW (ref 39–117)
Total Protein: 7.8 g/dL (ref 6.0–8.3)

## 2012-12-08 LAB — POCT URINALYSIS DIPSTICK
Glucose, UA: NEGATIVE
Nitrite, UA: NEGATIVE
Protein, UA: NEGATIVE
Spec Grav, UA: 1.015
pH, UA: 6

## 2012-12-08 LAB — BASIC METABOLIC PANEL
Calcium: 10 mg/dL (ref 8.4–10.5)
Creatinine, Ser: 1.1 mg/dL (ref 0.4–1.2)
GFR: 55.48 mL/min — ABNORMAL LOW (ref >60.00)
Sodium: 137 mEq/L (ref 135–145)

## 2012-12-08 LAB — LIPID PANEL
Cholesterol: 161 mg/dL (ref 0–200)
Triglycerides: 50 mg/dL (ref 0.0–149.0)

## 2012-12-08 LAB — TSH: TSH: 2.95 u[IU]/mL (ref 0.35–5.50)

## 2012-12-15 ENCOUNTER — Ambulatory Visit (INDEPENDENT_AMBULATORY_CARE_PROVIDER_SITE_OTHER): Payer: 59 | Admitting: Internal Medicine

## 2012-12-15 ENCOUNTER — Encounter: Payer: Self-pay | Admitting: Internal Medicine

## 2012-12-15 VITALS — BP 140/80 | HR 76 | Temp 98.0°F | Resp 16 | Ht 62.0 in | Wt 175.0 lb

## 2012-12-15 DIAGNOSIS — Z0000 Encounter for general adult medical examination without abnormal findings: Secondary | ICD-10-CM

## 2012-12-15 NOTE — Patient Instructions (Signed)
° °  Iron-Rich Diet ° °An iron-rich diet contains foods that are good sources of iron. Iron is an important mineral that helps your body produce hemoglobin. Hemoglobin is a protein in red blood cells that carries oxygen to the body's tissues. Sometimes, the iron level in your blood can be low. This may be caused by: °· A lack of iron in your diet. °· Blood loss. °· Times of growth, such as during pregnancy or during a child's growth and development. °Low levels of iron can cause a decrease in the number of red blood cells. This can result in iron deficiency anemia. Iron deficiency anemia symptoms include: °· Tiredness. °· Weakness. °· Irritability. °· Increased chance of infection. °Here are some recommendations for daily iron intake: °· Males older than 64 years of age need 8 mg of iron per day. °· Women ages 19 to 50 need 18 mg of iron per day. °· Pregnant women need 27 mg of iron per day, and women who are over 19 years of age and breastfeeding need 9 mg of iron per day. °· Women over the age of 50 need 8 mg of iron per day. °SOURCES OF IRON °There are 2 types of iron that are found in food: heme iron and nonheme iron. Heme iron is absorbed by the body better than nonheme iron. Heme iron is found in meat, poultry, and fish. Nonheme iron is found in grains, beans, and vegetables. °Heme Iron Sources °Food / Iron (mg) °· Chicken liver, 3 oz (85 g)/ 10 mg °· Beef liver, 3 oz (85 g)/ 5.5 mg °· Oysters, 3 oz (85 g)/ 8 mg °· Beef, 3 oz (85 g)/ 2 to 3 mg °· Shrimp, 3 oz (85 g)/ 2.8 mg °· Turkey, 3 oz (85 g)/ 2 mg °· Chicken, 3 oz (85 g) / 1 mg °· Fish (tuna, halibut), 3 oz (85 g)/ 1 mg °· Pork, 3 oz (85 g)/ 0.9 mg °Nonheme Iron Sources °Food / Iron (mg) °· Ready-to-eat breakfast cereal, iron-fortified / 3.9 to 7 mg °· Tofu, ½ cup / 3.4 mg °· Kidney beans, ½ cup / 2.6 mg °· Baked potato with skin / 2.7 mg °· Asparagus, ½ cup / 2.2 mg °· Avocado / 2 mg °· Dried peaches, ½ cup / 1.6 mg °· Raisins, ½ cup / 1.5 mg °· Soy milk,  1 cup / 1.5 mg °· Whole-wheat bread, 1 slice / 1.2 mg °· Spinach, 1 cup / 0.8 mg °· Broccoli, ½ cup / 0.6 mg °IRON ABSORPTION °Certain foods can decrease the body's absorption of iron. Try to avoid these foods and beverages while eating meals with iron-containing foods: °· Coffee. °· Tea. °· Fiber. °· Soy. °Foods containing vitamin C can help increase the amount of iron your body absorbs from iron sources, especially from nonheme sources. Eat foods with vitamin C along with iron-containing foods to increase your iron absorption. Foods that are high in vitamin C include many fruits and vegetables. Some good sources are: °· Fresh orange juice. °· Oranges. °· Strawberries. °· Mangoes. °· Grapefruit. °· Red bell peppers. °· Green bell peppers. °· Broccoli. °· Potatoes with skin. °· Tomato juice. °Document Released: 08/25/2004 Document Revised: 04/05/2011 Document Reviewed: 07/02/2010 °[employer]® Patient Information ©2014 [employer]. ° °

## 2012-12-15 NOTE — Progress Notes (Signed)
Subjective:    Patient ID: Ann Washington, female    DOB: [birth year], 62 y.o.   MRN: [id number]  HPI    Review of Systems  Constitutional: Negative for activity change, appetite change and fatigue.  HENT: Negative for congestion, ear pain, postnasal drip and sinus pressure.   Eyes: Negative for redness and visual disturbance.  Respiratory: Negative for cough, shortness of breath and wheezing.   Gastrointestinal: Negative for abdominal pain and abdominal distention.  Genitourinary: Negative for dysuria, frequency and menstrual problem.  Musculoskeletal: Negative for arthralgias, joint swelling, myalgias and neck pain.  Skin: Negative for rash and wound.  Neurological: Negative for dizziness, weakness and headaches.  Hematological: Negative for adenopathy. Does not bruise/bleed easily.  Psychiatric/Behavioral: Negative for sleep disturbance and decreased concentration.   Past Medical History  Diagnosis Date  . Depression   . GERD (gastroesophageal reflux disease)   . Hyperlipidemia   . Hypertension   . CIN I (cervical intraepithelial neoplasia I)   . Glaucoma     History   Social History  . Marital Status: Married    Spouse Name: N/A    Number of Children: N/A  . Years of Education: N/A   Occupational History  . Not on file.   Social History Main Topics  . Smoking status: Never Smoker   . Smokeless tobacco: Never Used  . Alcohol Use: No  . Drug Use: No  . Sexual Activity: Yes    Birth Control/ Protection: Surgical   Other Topics Concern  . Not on file   Social History Narrative  . No narrative on file    Past Surgical History  Procedure Laterality Date  . Colposcopy    . Gynecologic cryosurgery    . Tubal ligation    . Hammer toe surgery      Family History  Problem Relation Age of Onset  . Diabetes Sister   . Breast cancer Sister     Age 27  . Hypertension Sister   . Alzheimer's disease Mother   . Hip fracture Mother   . Osteoporosis Mother    . Deep vein thrombosis Father   . Heart disease Father   . Hypertension Brother   . Colon cancer Neg Hx     Allergies  Allergen Reactions  . Amlodipine Besy-Benazepril Hcl Other (See Comments)    Edema to ankle with redness    Current Outpatient Prescriptions on File Prior to Visit  Medication Sig Dispense Refill  . AZOR 5-40 MG per tablet TAKE 1 TABLET BY MOUTH DAILY.  30 tablet  11  . Calcium Carbonate-Vit D-Min 600-400 MG-UNIT TABS Take by mouth daily.        Marland Kitchen escitalopram (LEXAPRO) 20 MG tablet TAKE 1/2 TABLET EVERY DAY  30 tablet  5  . estradiol (ESTRACE) 0.1 MG/GM vaginal cream Use 1 g in vagina 3 times a week.  42.5 g  6  . fish oil-omega-3 fatty acids 1000 MG capsule Take 2 g by mouth daily.        Marland Kitchen LIPOFEN 150 MG CAPS TAKE 1 CAPSULE EVERY DAY  30 capsule  5  . MOVIPREP 100 G SOLR Take 1 kit (100 g total) by mouth once. moviprep as directed. No substitutions  1 kit  0  . Multiple Vitamin (MULTIVITAMIN) tablet Take 1 tablet by mouth daily.        Marland Kitchen omeprazole (PRILOSEC) 20 MG capsule Take 20 mg by mouth daily.      . travoprost, benzalkonium, (  TRAVATAN) 0.004 % ophthalmic solution Place 1 drop into both eyes at bedtime.        Marland Kitchen zolpidem (AMBIEN) 10 MG tablet TAKE ONE TABLET BY MOUTH AT BEDTIME AS NEEDED FOR SLEEP  30 tablet  5   No current facility-administered medications on file prior to visit.    BP 140/80  Pulse 76  Temp(Src) 98 F (36.7 C)  Resp 16  Ht 5\' 2"  (1.575 m)  Wt 175 lb (79.379 kg)  BMI 32.00 kg/m2       Objective:   Physical Exam  Nursing note and vitals reviewed. Constitutional: She is oriented to person, place, and time. She appears well-developed and well-nourished.  Moderate obesity  HENT:  Head: Normocephalic and atraumatic.  Eyes: Conjunctivae are normal. Pupils are equal, round, and reactive to light.  Neck: Normal range of motion. Neck supple.  Cardiovascular: Normal rate and regular rhythm.   Pulmonary/Chest: Breath sounds normal.   Abdominal: Soft. Bowel sounds are normal.  Musculoskeletal: She exhibits edema. She exhibits no tenderness.  Neurological: She is alert and oriented to person, place, and time.  Skin: Skin is warm and dry.  Lower extremity varicosities and retinacular veins  Psychiatric: She has a normal mood and affect. Her behavior is normal.          Assessment & Plan:   This is a routine physical examination for this healthy  Female. Reviewed all health maintenance protocols including mammography colonoscopy bone density and reviewed appropriate screening labs. Her immunization history was reviewed as well as her current medications and allergies refills of her chronic medications were given and the plan for yearly health maintenance was discussed all orders and referrals were made as appropriate.

## 2012-12-15 NOTE — Progress Notes (Signed)
Pre-visit discussion using our clinic review tool. No additional management support is needed unless otherwise documented below in the visit note.  

## 2013-01-10 ENCOUNTER — Encounter: Payer: Self-pay | Admitting: Internal Medicine

## 2013-02-21 ENCOUNTER — Other Ambulatory Visit: Payer: Self-pay | Admitting: *Deleted

## 2013-02-21 ENCOUNTER — Telehealth: Payer: Self-pay | Admitting: Internal Medicine

## 2013-02-21 MED ORDER — ZOLPIDEM TARTRATE 10 MG PO TABS: ORAL_TABLET | ORAL | Status: DC

## 2013-02-21 NOTE — Telephone Encounter (Signed)
Pt is requesting rx zolpidiem 10 mg  30 day supply sent [facility] on [address].

## 2013-03-06 ENCOUNTER — Ambulatory Visit (INDEPENDENT_AMBULATORY_CARE_PROVIDER_SITE_OTHER): Payer: 59 | Admitting: Family Medicine

## 2013-03-06 ENCOUNTER — Encounter: Payer: Self-pay | Admitting: Family Medicine

## 2013-03-06 VITALS — BP 162/84 | HR 98 | Temp 98.5°F | Ht 62.0 in | Wt 182.0 lb

## 2013-03-06 DIAGNOSIS — J069 Acute upper respiratory infection, unspecified: Secondary | ICD-10-CM

## 2013-03-06 MED ORDER — HYDROCOD POLST-CHLORPHEN POLST 10-8 MG/5ML PO LQCR: 5.0000 mL | Freq: Two times a day (BID) | ORAL | Status: DC | PRN

## 2013-03-06 NOTE — Progress Notes (Signed)
Chief Complaint  Patient presents with  . Cough    productive x1 1/2 wks    HPI:  -started:10 days ago -symptoms:nasal congestion, sore throat, cough, sinus pressure initially - sinus issues improved but pnd and cough still lingering -denies:fever, SOB, NVD, tooth pain -has tried: otc medication -sick contacts/travel/risks: denies flu exposure or Ebola risks  ROS: See pertinent positives and negatives per HPI.  Past Medical History  Diagnosis Date  . Depression   . GERD (gastroesophageal reflux disease)   . Hyperlipidemia   . Hypertension   . CIN I (cervical intraepithelial neoplasia I)   . Glaucoma     Past Surgical History  Procedure Laterality Date  . Colposcopy    . Gynecologic cryosurgery    . Tubal ligation    . Hammer toe surgery      Family History  Problem Relation Age of Onset  . Diabetes Sister   . Breast cancer Sister     Age 23  . Hypertension Sister   . Alzheimer's disease Mother   . Hip fracture Mother   . Osteoporosis Mother   . Deep vein thrombosis Father   . Heart disease Father   . Hypertension Brother   . Colon cancer Neg Hx     History   Social History  . Marital Status: Married    Spouse Name: N/A    Number of Children: N/A  . Years of Education: N/A   Social History Main Topics  . Smoking status: Never Smoker   . Smokeless tobacco: Never Used  . Alcohol Use: No  . Drug Use: No  . Sexual Activity: Yes    Birth Control/ Protection: Surgical   Other Topics Concern  . None   Social History Narrative  . None    Current outpatient prescriptions:AZOR 5-40 MG per tablet, TAKE 1 TABLET BY MOUTH DAILY., Disp: 30 tablet, Rfl: 11;  Calcium Carbonate-Vit D-Min 600-400 MG-UNIT TABS, Take by mouth daily.  , Disp: , Rfl: ;  escitalopram (LEXAPRO) 20 MG tablet, TAKE 1/2 TABLET EVERY DAY, Disp: 30 tablet, Rfl: 5;  estradiol (ESTRACE) 0.1 MG/GM vaginal cream, Use 1 g in vagina 3 times a week., Disp: 42.5 g, Rfl: 6 fish oil-omega-3 fatty  acids 1000 MG capsule, Take 2 g by mouth daily.  , Disp: , Rfl: ;  LIPOFEN 150 MG CAPS, TAKE 1 CAPSULE EVERY DAY, Disp: 30 capsule, Rfl: 5;  Multiple Vitamin (MULTIVITAMIN) tablet, Take 1 tablet by mouth daily.  , Disp: , Rfl: ;  omeprazole (PRILOSEC) 20 MG capsule, Take 20 mg by mouth daily., Disp: , Rfl:  travoprost, benzalkonium, (TRAVATAN) 0.004 % ophthalmic solution, Place 1 drop into both eyes at bedtime.  , Disp: , Rfl: ;  zolpidem (AMBIEN) 10 MG tablet, TAKE ONE TABLET BY MOUTH AT BEDTIME AS NEEDED FOR SLEEP, Disp: 30 tablet, Rfl: 5;  chlorpheniramine-HYDROcodone (TUSSIONEX PENNKINETIC ER) 10-8 MG/5ML LQCR, Take 5 mLs by mouth every 12 (twelve) hours as needed for cough., Disp: 115 mL, Rfl: 0  EXAM:  Filed Vitals:   03/06/13 1606  BP: 162/84  Pulse: 98  Temp: 98.5 F (36.9 C)    Body mass index is 33.28 kg/(m^2).  GENERAL: vitals reviewed and listed above, alert, oriented, appears well hydrated and in no acute distress  HEENT: atraumatic, conjunttiva clear, no obvious abnormalities on inspection of external nose and ears, normal appearance of ear canals and TMs, clear nasal congestion, mild post oropharyngeal erythema with PND, no tonsillar edema or exudate, no  sinus TTP  NECK: no obvious masses on inspection  LUNGS: clear to auscultation bilaterally, no wheezes, rales or rhonchi, good air movement  CV: HRRR, no peripheral edema  MS: moves all extremities without noticeable abnormality  PSYCH: pleasant and cooperative, no obvious depression or anxiety  ASSESSMENT AND PLAN:  Discussed the following assessment and plan:  Upper respiratory infection - Plan: chlorpheniramine-HYDROcodone (TUSSIONEX PENNKINETIC ER) 10-8 MG/5ML LQCR  -given HPI and exam findings today, a serious infection or illness is unlikely. We discussed potential etiologies, with VURI being most likely, and advised supportive care and monitoring. We discussed treatment side effects, likely course, antibiotic  misuse (offered rx to have on hand if worsening or persistent-she opted to hold off on this), transmission, and signs of developing a serious illness. -of course, we advised to return or notify a doctor immediately if symptoms worsen or persist or new concerns arise.    Patient Instructions  INSTRUCTIONS FOR UPPER RESPIRATORY INFECTION:  -plenty of rest and fluids  -nasal saline wash 2-3 times daily (use prepackaged nasal saline or bottled/distilled water if making your own)   -can use sinex or afrin nasal spray for drainage and nasal congestion - but do NOT use longer then 3-4 days  -can use tylenol or ibuprofen as directed for aches and sorethroat  -in the winter time, using a humidifier at night is helpful (please follow cleaning instructions)  -if you are taking a cough medication - use only as directed, may also try a teaspoon of honey to coat the throat and throat lozenges  -for sore throat, salt water gargles can help  -follow up if you have fevers, facial pain, tooth pain, difficulty breathing or are worsening or not getting better in 5-7 days      Aleysia Oltmann R.

## 2013-03-06 NOTE — Patient Instructions (Signed)

## 2013-03-06 NOTE — Progress Notes (Signed)
Pre visit review using our clinic review tool, if applicable. No additional management support is needed unless otherwise documented below in the visit note. 

## 2013-04-13 ENCOUNTER — Telehealth: Payer: Self-pay | Admitting: Internal Medicine

## 2013-04-13 NOTE — Telephone Encounter (Signed)
No coupon, per Dr Arnoldo Morale ok for samples.  Samples up front for p/u, pt aware

## 2013-04-13 NOTE — Telephone Encounter (Signed)
Pt is trying to see if there is a coupon available for rx lipofen, states she switched pharmacy and is needing one for her new pharmacy [facility]. Requesting if possible to mail her the coupons or contact her if she need to come and pick it up.

## 2013-04-23 ENCOUNTER — Other Ambulatory Visit: Payer: Self-pay | Admitting: Internal Medicine

## 2013-05-22 ENCOUNTER — Other Ambulatory Visit: Payer: Self-pay | Admitting: Internal Medicine

## 2013-06-20 ENCOUNTER — Other Ambulatory Visit: Payer: Self-pay | Admitting: Internal Medicine

## 2013-06-25 ENCOUNTER — Ambulatory Visit: Payer: 59

## 2013-06-25 ENCOUNTER — Encounter: Payer: Self-pay | Admitting: Physician Assistant

## 2013-06-25 ENCOUNTER — Ambulatory Visit (INDEPENDENT_AMBULATORY_CARE_PROVIDER_SITE_OTHER): Payer: 59 | Admitting: Physician Assistant

## 2013-06-25 ENCOUNTER — Ambulatory Visit: Payer: 59 | Admitting: Internal Medicine

## 2013-06-25 VITALS — BP 120/80 | HR 74 | Temp 98.7°F | Resp 18 | Wt 177.0 lb

## 2013-06-25 DIAGNOSIS — I10 Essential (primary) hypertension: Secondary | ICD-10-CM

## 2013-06-25 DIAGNOSIS — E785 Hyperlipidemia, unspecified: Secondary | ICD-10-CM

## 2013-06-25 DIAGNOSIS — Z09 Encounter for follow-up examination after completed treatment for conditions other than malignant neoplasm: Secondary | ICD-10-CM

## 2013-06-25 LAB — CBC WITH DIFFERENTIAL/PLATELET
BASOS PCT: 0.7 % (ref 0.0–3.0)
Basophils Absolute: 0 10*3/uL (ref 0.0–0.1)
Eosinophils Absolute: 0.4 10*3/uL (ref 0.0–0.7)
Eosinophils Relative: 6.4 % — ABNORMAL HIGH (ref 0.0–5.0)
HCT: 34 % — ABNORMAL LOW (ref 36.0–46.0)
HEMOGLOBIN: 11.5 g/dL — AB (ref 12.0–15.0)
Lymphocytes Relative: 31.1 % (ref 12.0–46.0)
Lymphs Abs: 2.1 10*3/uL (ref 0.7–4.0)
MCHC: 33.9 g/dL (ref 30.0–36.0)
MCV: 90.3 fl (ref 78.0–100.0)
MONO ABS: 0.7 10*3/uL (ref 0.1–1.0)
Monocytes Relative: 9.9 % (ref 3.0–12.0)
Neutro Abs: 3.4 10*3/uL (ref 1.4–7.7)
Neutrophils Relative %: 51.9 % (ref 43.0–77.0)
Platelets: 303 10*3/uL (ref 150.0–400.0)
RBC: 3.76 Mil/uL — ABNORMAL LOW (ref 3.87–5.11)
RDW: 12.6 % (ref 11.5–15.5)
WBC: 6.6 10*3/uL (ref 4.0–10.5)

## 2013-06-25 LAB — BASIC METABOLIC PANEL
BUN: 29 mg/dL — AB (ref 6–23)
CO2: 25 mEq/L (ref 19–32)
Calcium: 9.6 mg/dL (ref 8.4–10.5)
Chloride: 97 mEq/L (ref 96–112)
Creatinine, Ser: 1.1 mg/dL (ref 0.4–1.2)
GFR: 52.52 mL/min — AB (ref >60.00)
GLUCOSE: 104 mg/dL — AB (ref 70–99)
Potassium: 4.6 mEq/L (ref 3.5–5.1)
Sodium: 150 mEq/L — ABNORMAL HIGH (ref 135–145)

## 2013-06-25 LAB — HEPATIC FUNCTION PANEL
ALBUMIN: 4.1 g/dL (ref 3.5–5.2)
ALT: 18 U/L (ref 0–35)
AST: 21 U/L (ref 0–37)
Alkaline Phosphatase: 32 U/L — ABNORMAL LOW (ref 39–117)
Bilirubin, Direct: 0.1 mg/dL (ref 0.0–0.3)
Total Bilirubin: 0.7 mg/dL (ref 0.2–1.2)
Total Protein: 7.3 g/dL (ref 6.0–8.3)

## 2013-06-25 NOTE — Progress Notes (Signed)
Subjective:    Patient ID: BLANNIE SHEDLOCK, female    DOB: 1950/07/01, 63 y.o.   MRN: [id number]  HPI Patient is a 63 year old Caucasian female presenting to the clinic for followup of hypertension and hyperlipidemia. Patient states that she has been tolerating her medications well. She has had no adverse effects on medication. She denies fevers, chills, nausea, vomiting, diarrhea, shortness of breath, chest pain, syncope, lightheadedness, headache.   Review of Systems As per the history of present illness and otherwise negative.   Past Medical History  Diagnosis Date  . Depression   . GERD (gastroesophageal reflux disease)   . Hyperlipidemia   . Hypertension   . CIN I (cervical intraepithelial neoplasia I)   . Glaucoma    Past Surgical History  Procedure Laterality Date  . Colposcopy    . Gynecologic cryosurgery    . Tubal ligation    . Hammer toe surgery      reports that she has never smoked. She has never used smokeless tobacco. She reports that she does not drink alcohol or use illicit drugs. family history includes Alzheimer's disease in her mother; Breast cancer in her sister; Deep vein thrombosis in her father; Diabetes in her sister; Heart disease in her father; Hip fracture in her mother; Hypertension in her brother and sister; Osteoporosis in her mother. There is no history of Colon cancer. Allergies  Allergen Reactions  . Amlodipine Besy-Benazepril Hcl Other (See Comments)    Edema to ankle with redness       Objective:   Physical Exam  Nursing note and vitals reviewed. Constitutional: She is oriented to person, place, and time. She appears well-developed and well-nourished. No distress.  HENT:  Head: Normocephalic and atraumatic.  Eyes: Conjunctivae and EOM are normal. Pupils are equal, round, and reactive to light.  Neck: Normal range of motion. Neck supple.  Cardiovascular: Normal rate, regular rhythm, normal heart sounds and intact distal pulses.  Exam  reveals no gallop and no friction rub.   No murmur heard. Pulmonary/Chest: Effort normal and breath sounds normal. No respiratory distress. She has no wheezes. She has no rales. She exhibits no tenderness.  Musculoskeletal: Normal range of motion. She exhibits no edema.  Neurological: She is alert and oriented to person, place, and time.  Skin: Skin is warm and dry. No rash noted. She is not diaphoretic. No erythema. No pallor.  Psychiatric: She has a normal mood and affect. Her behavior is normal. Judgment and thought content normal.   Filed Vitals:   06/25/13 0815  BP: 120/80  Pulse: 74  Temp: 98.7 F (37.1 C)  Resp: 18    Lab Results  Component Value Date   WBC 7.3 12/08/2012   HGB 11.7* 12/08/2012   HCT 34.3* 12/08/2012   PLT 297.0 12/08/2012   GLUCOSE 103* 12/08/2012   CHOL 161 12/08/2012   TRIG 50.0 12/08/2012   HDL 50.80 12/08/2012   LDLDIRECT 144.5 02/29/2008   LDLCALC 100* 12/08/2012   ALT 18 12/08/2012   AST 22 12/08/2012   NA 137 12/08/2012   K 3.9 12/08/2012   CL 105 12/08/2012   CREATININE 1.1 12/08/2012   BUN 32* 12/08/2012   CO2 26 12/08/2012   TSH 2.95 12/08/2012   HGBA1C 5.8 10/12/2011       Assessment & Plan:  Jackie was seen today for recheck blood presure.  Diagnoses and associated orders for this visit:  Follow up Comments: Doing well. Handleing medications well. No changes. - CBC  with Differential - Basic Metabolic Panel - Hepatic Function Panel  Other and unspecified hyperlipidemia Comments: No changes to supplements and medications. Will check Lipid panel and LFTs. - CBC with Differential - Basic Metabolic Panel - Hepatic Function Panel  Unspecified essential hypertension Comments: Stable. Tolerating meds well. Will check BMP. - CBC with Differential - Basic Metabolic Panel - Hepatic Function Panel    Plan to continue current medications pending lab results, and follow up as needed.  Patient Instructions  We will call you  with the results of your lab work when they are available.  Continue current medication.  Followup as needed.

## 2013-06-25 NOTE — Progress Notes (Signed)
Pre visit review using our clinic review tool, if applicable. No additional management support is needed unless otherwise documented below in the visit note. 

## 2013-06-25 NOTE — Patient Instructions (Signed)
We will call you with the results of your lab work when they are available.  Continue current medication.  Followup as needed.   Health Maintenance, Female A healthy lifestyle and preventative care can promote health and wellness.  Maintain regular health, dental, and eye exams.  Eat a healthy diet. Foods like vegetables, fruits, whole grains, low-fat dairy products, and lean protein foods contain the nutrients you need without too many calories. Decrease your intake of foods high in solid fats, added sugars, and salt. Get information about a proper diet from your caregiver, if necessary.  Regular physical exercise is one of the most important things you can do for your health. Most adults should get at least 150 minutes of moderate-intensity exercise (any activity that increases your heart rate and causes you to sweat) each week. In addition, most adults need muscle-strengthening exercises on 2 or more days a week.   Maintain a healthy weight. The body mass index (BMI) is a screening tool to identify possible weight problems. It provides an estimate of body fat based on height and weight. Your caregiver can help determine your BMI, and can help you achieve or maintain a healthy weight. For adults 20 years and older:  A BMI below 18.5 is considered underweight.  A BMI of 18.5 to 24.9 is normal.  A BMI of 25 to 29.9 is considered overweight.  A BMI of 30 and above is considered obese.  Maintain normal blood lipids and cholesterol by exercising and minimizing your intake of saturated fat. Eat a balanced diet with plenty of fruits and vegetables. Blood tests for lipids and cholesterol should begin at age 35 and be repeated every 5 years. If your lipid or cholesterol levels are high, you are over 50, or you are a high risk for heart disease, you may need your cholesterol levels checked more frequently.Ongoing high lipid and cholesterol levels should be treated with medicines if diet and  exercise are not effective.  If you smoke, find out from your caregiver how to quit. If you do not use tobacco, do not start.  Lung cancer screening is recommended for adults aged 31 80 years who are at high risk for developing lung cancer because of a history of smoking. Yearly low-dose computed tomography (CT) is recommended for people who have at least a 30-pack-year history of smoking and are a current smoker or have quit within the past 15 years. A pack year of smoking is smoking an average of 1 pack of cigarettes a day for 1 year (for example: 1 pack a day for 30 years or 2 packs a day for 15 years). Yearly screening should continue until the smoker has stopped smoking for at least 15 years. Yearly screening should also be stopped for people who develop a health problem that would prevent them from having lung cancer treatment.  If you are pregnant, do not drink alcohol. If you are breastfeeding, be very cautious about drinking alcohol. If you are not pregnant and choose to drink alcohol, do not exceed 1 drink per day. One drink is considered to be 12 ounces (355 mL) of beer, 5 ounces (148 mL) of wine, or 1.5 ounces (44 mL) of liquor.  Avoid use of street drugs. Do not share needles with anyone. Ask for help if you need support or instructions about stopping the use of drugs.  High blood pressure causes heart disease and increases the risk of stroke. Blood pressure should be checked at least every 1 to  2 years. Ongoing high blood pressure should be treated with medicines, if weight loss and exercise are not effective.  If you are 71 to 65 years old, ask your caregiver if you should take aspirin to prevent strokes.  Diabetes screening involves taking a blood sample to check your fasting blood sugar level. This should be done once every 3 years, after age 45, if you are within normal weight and without risk factors for diabetes. Testing should be considered at a younger age or be carried out more  frequently if you are overweight and have at least 1 risk factor for diabetes.  Breast cancer screening is essential preventative care for women. You should practice "breast self-awareness." This means understanding the normal appearance and feel of your breasts and may include breast self-examination. Any changes detected, no matter how small, should be reported to a caregiver. Women in their 57s and 30s should have a clinical breast exam (CBE) by a caregiver as part of a regular health exam every 1 to 3 years. After age 56, women should have a CBE every year. Starting at age 39, women should consider having a mammogram (breast X-ray) every year. Women who have a family history of breast cancer should talk to their caregiver about genetic screening. Women at a high risk of breast cancer should talk to their caregiver about having an MRI and a mammogram every year.  Breast cancer gene (BRCA)-related cancer risk assessment is recommended for women who have family members with BRCA-related cancers. BRCA-related cancers include breast, ovarian, tubal, and peritoneal cancers. Having family members with these cancers may be associated with an increased risk for harmful changes (mutations) in the breast cancer genes BRCA1 and BRCA2. Results of the assessment will determine the need for genetic counseling and BRCA1 and BRCA2 testing.  The Pap test is a screening test for cervical cancer. Women should have a Pap test starting at age 44. Between ages 66 and 81, Pap tests should be repeated every 2 years. Beginning at age 68, you should have a Pap test every 3 years as long as the past 3 Pap tests have been normal. If you had a hysterectomy for a problem that was not cancer or a condition that could lead to cancer, then you no longer need Pap tests. If you are between ages 37 and 55, and you have had normal Pap tests going back 10 years, you no longer need Pap tests. If you have had past treatment for cervical cancer or  a condition that could lead to cancer, you need Pap tests and screening for cancer for at least 20 years after your treatment. If Pap tests have been discontinued, risk factors (such as a new sexual partner) need to be reassessed to determine if screening should be resumed. Some women have medical problems that increase the chance of getting cervical cancer. In these cases, your caregiver may recommend more frequent screening and Pap tests.  The human papillomavirus (HPV) test is an additional test that may be used for cervical cancer screening. The HPV test looks for the virus that can cause the cell changes on the cervix. The cells collected during the Pap test can be tested for HPV. The HPV test could be used to screen women aged 16 years and older, and should be used in women of any age who have unclear Pap test results. After the age of 68, women should have HPV testing at the same frequency as a Pap test.  Colorectal cancer  can be detected and often prevented. Most routine colorectal cancer screening begins at the age of 72 and continues through age 44. However, your caregiver may recommend screening at an earlier age if you have risk factors for colon cancer. On a yearly basis, your caregiver may provide home test kits to check for hidden blood in the stool. Use of a small camera at the end of a tube, to directly examine the colon (sigmoidoscopy or colonoscopy), can detect the earliest forms of colorectal cancer. Talk to your caregiver about this at age 6, when routine screening begins. Direct examination of the colon should be repeated every 5 to 10 years through age 6, unless early forms of pre-cancerous polyps or small growths are found.  Hepatitis C blood testing is recommended for all people born from [birth year] through 1967 and any individual with known risks for hepatitis C.  Practice safe sex. Use condoms and avoid high-risk sexual practices to reduce the spread of sexually transmitted infections  (STIs). Sexually active women aged 24 and younger should be checked for Chlamydia, which is a common sexually transmitted infection. Older women with new or multiple partners should also be tested for Chlamydia. Testing for other STIs is recommended if you are sexually active and at increased risk.  Osteoporosis is a disease in which the bones lose minerals and strength with aging. This can result in serious bone fractures. The risk of osteoporosis can be identified using a bone density scan. Women ages 28 and over and women at risk for fractures or osteoporosis should discuss screening with their caregivers. Ask your caregiver whether you should be taking a calcium supplement or vitamin D to reduce the rate of osteoporosis.  Menopause can be associated with physical symptoms and risks. Hormone replacement therapy is available to decrease symptoms and risks. You should talk to your caregiver about whether hormone replacement therapy is right for you.  Use sunscreen. Apply sunscreen liberally and repeatedly throughout the day. You should seek shade when your shadow is shorter than you. Protect yourself by wearing long sleeves, pants, a wide-brimmed hat, and sunglasses year round, whenever you are outdoors.  Notify your caregiver of new moles or changes in moles, especially if there is a change in shape or color. Also notify your caregiver if a mole is larger than the size of a pencil eraser.  Stay current with your immunizations. Document Released: 07/27/2010 Document Revised: 05/08/2012 Document Reviewed: 07/27/2010 [facility] Patient Information 2014 [place].

## 2013-06-26 ENCOUNTER — Telehealth: Payer: Self-pay | Admitting: Physician Assistant

## 2013-06-26 LAB — LIPID PANEL
CHOLESTEROL: 172 mg/dL (ref 0–200)
HDL: 49.6 mg/dL (ref >39.00)
LDL Cholesterol: 109 mg/dL — ABNORMAL HIGH (ref 0–99)
Total CHOL/HDL Ratio: 3
Triglycerides: 69 mg/dL (ref 0.0–149.0)
VLDL: 13.8 mg/dL (ref 0.0–40.0)

## 2013-06-26 NOTE — Telephone Encounter (Signed)
Called pt and relayed lab work results. All results are stable compared with 6 months ago. Sodium was slightly elevated. Encouraged pt to maintain adequate hydration with HTN treatment. Plan to follow up as needed. Will recheck lab work in 6 months.

## 2013-06-26 NOTE — Telephone Encounter (Signed)
Left HIPAA compliant message for return office call.

## 2013-08-10 ENCOUNTER — Other Ambulatory Visit: Payer: Self-pay | Admitting: Internal Medicine

## 2013-08-17 ENCOUNTER — Other Ambulatory Visit: Payer: Self-pay

## 2013-08-17 DIAGNOSIS — Z1231 Encounter for screening mammogram for malignant neoplasm of breast: Secondary | ICD-10-CM

## 2013-08-21 ENCOUNTER — Other Ambulatory Visit: Payer: Self-pay | Admitting: Obstetrics and Gynecology

## 2013-08-21 DIAGNOSIS — Z780 Asymptomatic menopausal state: Secondary | ICD-10-CM

## 2013-08-23 ENCOUNTER — Ambulatory Visit: Payer: 59

## 2013-08-27 ENCOUNTER — Ambulatory Visit
Admission: RE | Admit: 2013-08-27 | Discharge: 2013-08-27 | Disposition: A | Discharge status: Home / Self Care | Payer: 59 | Source: Ambulatory Visit

## 2013-08-27 ENCOUNTER — Ambulatory Visit
Admission: RE | Admit: 2013-08-27 | Discharge: 2013-08-27 | Disposition: A | Discharge status: Home / Self Care | Payer: 59 | Source: Ambulatory Visit | Attending: Obstetrics and Gynecology | Admitting: Obstetrics and Gynecology

## 2013-08-27 DIAGNOSIS — Z1231 Encounter for screening mammogram for malignant neoplasm of breast: Secondary | ICD-10-CM

## 2013-08-27 DIAGNOSIS — Z780 Asymptomatic menopausal state: Secondary | ICD-10-CM

## 2013-10-25 ENCOUNTER — Other Ambulatory Visit: Payer: Self-pay | Admitting: Internal Medicine

## 2013-11-26 ENCOUNTER — Encounter: Payer: Self-pay | Admitting: Physician Assistant

## 2013-12-18 ENCOUNTER — Telehealth: Payer: Self-pay | Admitting: Internal Medicine

## 2013-12-18 NOTE — Telephone Encounter (Signed)
[id number] - [place], [address] [phone] is requesting re-fill on zolpidem (AMBIEN) 10 MG tablet

## 2013-12-19 NOTE — Telephone Encounter (Signed)
Is this ok to refill?  

## 2013-12-19 NOTE — Telephone Encounter (Signed)
Yes but inform patient max recommended dose is 5mg  for females.  If she is ok with this, send in.

## 2013-12-24 ENCOUNTER — Other Ambulatory Visit: Payer: Self-pay

## 2013-12-24 MED ORDER — ZOLPIDEM TARTRATE 10 MG PO TABS: 10.0000 mg | ORAL_TABLET | Freq: Every evening | ORAL | Status: DC | PRN

## 2013-12-24 NOTE — Telephone Encounter (Signed)
Medication refilled

## 2014-01-07 DIAGNOSIS — H4011X1 Primary open-angle glaucoma, mild stage: Secondary | ICD-10-CM | POA: Diagnosis not present

## 2014-01-21 ENCOUNTER — Other Ambulatory Visit: Payer: Self-pay | Admitting: Family Medicine

## 2014-01-21 NOTE — Telephone Encounter (Signed)
Is this ok to refill?  

## 2014-01-21 NOTE — Telephone Encounter (Signed)
ok 

## 2014-01-31 DIAGNOSIS — I8312 Varicose veins of left lower extremity with inflammation: Secondary | ICD-10-CM | POA: Diagnosis not present

## 2014-01-31 DIAGNOSIS — I8391 Asymptomatic varicose veins of right lower extremity: Secondary | ICD-10-CM | POA: Diagnosis not present

## 2014-01-31 DIAGNOSIS — I8311 Varicose veins of right lower extremity with inflammation: Secondary | ICD-10-CM | POA: Diagnosis not present

## 2014-01-31 DIAGNOSIS — D225 Melanocytic nevi of trunk: Secondary | ICD-10-CM | POA: Diagnosis not present

## 2014-01-31 DIAGNOSIS — L821 Other seborrheic keratosis: Secondary | ICD-10-CM | POA: Diagnosis not present

## 2014-01-31 DIAGNOSIS — L814 Other melanin hyperpigmentation: Secondary | ICD-10-CM | POA: Diagnosis not present

## 2014-01-31 DIAGNOSIS — I8392 Asymptomatic varicose veins of left lower extremity: Secondary | ICD-10-CM | POA: Diagnosis not present

## 2014-02-08 ENCOUNTER — Other Ambulatory Visit: Payer: Self-pay | Admitting: *Deleted

## 2014-02-08 MED ORDER — AMLODIPINE-OLMESARTAN 5-40 MG PO TABS: 1.0000 | ORAL_TABLET | Freq: Every day | ORAL | Status: DC

## 2014-02-14 ENCOUNTER — Encounter: Payer: Self-pay | Admitting: Podiatry

## 2014-02-14 ENCOUNTER — Ambulatory Visit (INDEPENDENT_AMBULATORY_CARE_PROVIDER_SITE_OTHER): Payer: Medicare Other

## 2014-02-14 ENCOUNTER — Ambulatory Visit (INDEPENDENT_AMBULATORY_CARE_PROVIDER_SITE_OTHER): Payer: Medicare Other | Admitting: Podiatry

## 2014-02-14 VITALS — BP 139/76 | HR 60 | Resp 12

## 2014-02-14 DIAGNOSIS — M7752 Other enthesopathy of left foot: Secondary | ICD-10-CM | POA: Diagnosis not present

## 2014-02-14 DIAGNOSIS — R52 Pain, unspecified: Secondary | ICD-10-CM

## 2014-02-14 NOTE — Progress Notes (Signed)
   Subjective:    Patient ID: Ann Washington, female    DOB: May 14, 1950, 64 y.o.   MRN: [id number]  HPI PT STATED LT FOOT ANKLE IS BEEN SORE FOR 3 MONTHS AGO. THE ANKLE IS NOT GETTING WORSE BUT WHEN WALKING IS GETTING WORSE. TRIED WEARING ACE BANDAGES AND IT HELP.   Review of Systems  All other systems reviewed and are negative.      Objective:   Physical Exam: I have reviewed her past medical history medications allergies surgery social history and review of systems. Pulses are strongly palpable bilaterally. Neurologic sensorium is intact for Semmes-Weinstein monofilament. Deep tendon reflexes are intact bilaterally muscle strength +5 over 5 dorsiflexion plantar flexors and inverters everters all just musculatures intact. Orthopedic evaluation and x-rays all joints distal to the ankle have a full range of motion without crepitation. At the ankle joint however she has tenderness on palpation of the medial malleolus and of the deltoid ligament in this area. Radiographic evaluation didn't stress an old avulsion fracture of the medial malleolus distally which more than likely impacted the integrity of the deltoid ligament.        Assessment & Plan:  Assessment: Capsulitis deltoid ligament inflammation of the left medial ankle.  Plan: Injected the area with Kenalog and local anesthetic and we'll follow-up with her in a few weeks.

## 2014-02-18 ENCOUNTER — Other Ambulatory Visit: Payer: Self-pay | Admitting: Internal Medicine

## 2014-02-20 MED ORDER — ZOLPIDEM TARTRATE 5 MG PO TABS: 5.0000 mg | ORAL_TABLET | Freq: Every evening | ORAL | Status: DC | PRN

## 2014-02-20 NOTE — Telephone Encounter (Signed)
Is this ok to refill?  

## 2014-02-20 NOTE — Telephone Encounter (Signed)
Lm on pt VM making her aware that refill was called in and giving her Dr. Yong Channel message.

## 2014-02-20 NOTE — Telephone Encounter (Signed)
Yes but please inform patient that 5mg  is max dose in women and in people over 65 so we will have to limit to 5mg  refills.

## 2014-03-08 ENCOUNTER — Encounter: Payer: Self-pay | Admitting: Family Medicine

## 2014-03-08 ENCOUNTER — Ambulatory Visit (INDEPENDENT_AMBULATORY_CARE_PROVIDER_SITE_OTHER): Payer: Medicare Other | Admitting: Family Medicine

## 2014-03-08 VITALS — BP 124/84 | HR 64 | Temp 98.5°F | Ht 60.75 in | Wt 182.0 lb

## 2014-03-08 DIAGNOSIS — Z23 Encounter for immunization: Secondary | ICD-10-CM | POA: Diagnosis not present

## 2014-03-08 DIAGNOSIS — G4700 Insomnia, unspecified: Secondary | ICD-10-CM

## 2014-03-08 DIAGNOSIS — F32A Depression, unspecified: Secondary | ICD-10-CM

## 2014-03-08 DIAGNOSIS — H409 Unspecified glaucoma: Secondary | ICD-10-CM | POA: Insufficient documentation

## 2014-03-08 DIAGNOSIS — F329 Major depressive disorder, single episode, unspecified: Secondary | ICD-10-CM

## 2014-03-08 DIAGNOSIS — R739 Hyperglycemia, unspecified: Secondary | ICD-10-CM

## 2014-03-08 DIAGNOSIS — I10 Essential (primary) hypertension: Secondary | ICD-10-CM

## 2014-03-08 DIAGNOSIS — IMO0001 Reserved for inherently not codable concepts without codable children: Secondary | ICD-10-CM

## 2014-03-08 DIAGNOSIS — Z0000 Encounter for general adult medical examination without abnormal findings: Secondary | ICD-10-CM | POA: Diagnosis not present

## 2014-03-08 DIAGNOSIS — E785 Hyperlipidemia, unspecified: Secondary | ICD-10-CM

## 2014-03-08 NOTE — Progress Notes (Signed)
Garret Reddish, MD Phone: [phone]  Subjective:  Patient presents today to establish care with me as their new primary care provider and for annual wellness visit. Patient was formerly a patient of Dr. Arnoldo Morale. Chief complaint-noted.   [facility] to [facility] 3x a week currently since November. Dr. Ubaldo Glassing yearly. Body check in January 2016. She has hypertension that has been well controlled on azor. She has hyperlipidemia reasonably controlled on fenofibrate. Depression well controlled on lexapro 10mg  with phq2 of 0. She is at risk for diabeteswith last a1c of 5.8. Her insomnia has been controlled despite transition to 5mg  ambien due to age.   ROS- no chest pain, shortness of breath, nausea, vomiting,  The following were reviewed and entered/updated in epic: Past Medical History  Diagnosis Date  . Depression   . GERD (gastroesophageal reflux disease)   . Hyperlipidemia   . Hypertension   . CIN I (cervical intraepithelial neoplasia I)     around age 68  . Glaucoma   . Snoring 03/08/2008    Tested 2007 per patient-unrevealing for sleep apnea    . 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 Insomnia 03/08/2014    Priority: Medium  . Hyperglycemia 12/09/2009    Priority: Medium  . Hyperlipidemia 10/13/2006    Priority: Medium  . Depression 10/13/2006    Priority: Medium  . Essential hypertension 10/10/2006    Priority: Medium  . Glaucoma 03/08/2014    Priority: Low  . Stress incontinence, female 10/12/2011    Priority: Low  . Vaginal atrophy 05/27/2011    Priority: Low  . Obesity 10/11/2008    Priority: Low  . GERD 08/25/2007    Priority: Low   Past Surgical History  Procedure Laterality Date  . Colposcopy      age 8  . Gynecologic cryosurgery      age 19  . Tubal ligation    . Hammer toe surgery    . Colonoscopy      Family History  Problem Relation Age of  Onset  . Diabetes Sister   . Breast cancer Sister     Age 54  . Hypertension Sister   . Alzheimer's disease Mother   . Hip fracture Mother   . Osteoporosis Mother   . Deep vein thrombosis Father   . Heart disease Father     MI 78s, nonsmoker  . Hypertension Brother   . Colon cancer Neg Hx     Medications- reviewed and updated Current Outpatient Prescriptions  Medication Sig Dispense Refill  . amLODipine-olmesartan (AZOR) 5-40 MG per tablet Take 1 tablet by mouth daily. 30 tablet 0  . Calcium Carbonate-Vit D-Min 600-400 MG-UNIT TABS Take by mouth daily.      Marland Kitchen escitalopram (LEXAPRO) 20 MG tablet TAKE 1/2 TABLET BY MOUTH EVERY DAY. 30 tablet 5  . Fenofibrate 150 MG CAPS TAKE 1 CAPSULE BY MOUTH EVERY DAY 90 capsule 1  . fish oil-omega-3 fatty acids 1000 MG capsule Take 2 g by mouth daily.      . Multiple Vitamin (MULTIVITAMIN) tablet Take 1 tablet by mouth daily.      Marland Kitchen omeprazole (PRILOSEC) 20 MG capsule Take 20 mg by mouth daily.    . travoprost, benzalkonium, (TRAVATAN) 0.004 % ophthalmic solution Place 1 drop into both eyes at bedtime.      Marland Kitchen zolpidem (AMBIEN) 5 MG tablet Take  1 tablet (5 mg total) by mouth at bedtime as needed for sleep. 30 tablet 0   No current facility-administered medications for this visit.    Allergies-reviewed and updated Allergies  Allergen Reactions  . Amlodipine Besy-Benazepril Hcl Other (See Comments)    Edema to ankle with redness    History   Social History  . Marital Status: Married    Spouse Name: N/A  . Number of Children: N/A  . Years of Education: N/A   Social History Main Topics  . Smoking status: Never Smoker   . Smokeless tobacco: Never Used  . Alcohol Use: No  . Drug Use: No  . Sexual Activity: Yes    Birth Control/ Protection: Surgical   Other Topics Concern  . None   Social History Narrative   Married (husband patient at [facility]). 3 children. 7 grandkids.       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       Hobbies: time with grandkids, walk when weather nice, reading, tv    ROS--See HPI   Objective: BP 124/84 mmHg  Pulse 64  Temp(Src) 98.5 F (36.9 C) (Oral)  Ht 5' 0.75" (1.543 m)  Wt 182 lb (82.555 kg)  BMI 34.67 kg/m2 Gen: NAD, resting comfortably HEENT: Mucous membranes are moist. Oropharynx normal. Good dentition.  Neck: no thyromegaly CV: RRR no murmurs rubs or gallops Lungs: CTAB no crackles, wheeze, rhonchi Abdomen: soft/nontender/nondistended/normal bowel sounds.  Ext: 1+ edema Skin: warm, dry Neuro: grossly normal, moves all extremities, PERRLA   Assessment/Plan:  Hyperlipidemia Mild poor control on Fenofibrate, fish oil. I advised statin would be medication of choice to lower cardiac risk due to triglycerides and also has history LDL elevation. She will return for fasting lipids. I advied a statin but she declined.    Depression Controlled on lexapro. Continue.    Essential hypertension No change. Controlled on azor.    Hyperglycemia Check a1c today   Insomnia Controlled on ambien 5mg  despite decrease from 10mg     Return precautions advised. 6 month follow up.   Future fasting labs Orders Placed This Encounter  Procedures  . Pneumococcal conjugate vaccine 13-valent  . CBC    [place]    Standing Status: Future     Number of Occurrences:      Standing Expiration Date: 03/09/2015  . Comprehensive metabolic panel    [place]    Standing Status: Future     Number of Occurrences:      Standing Expiration Date: 03/09/2015    Order Specific Question:  Has the patient fasted?    Answer:  No  . Lipid panel    [place]    Standing Status: Future     Number of Occurrences:      Standing Expiration Date: 03/09/2015    Order Specific Question:  Has the patient fasted?    Answer:  No  . TSH        Standing Status: Future     Number of Occurrences:      Standing Expiration Date: 03/09/2015  . Hemoglobin A1c        Standing  Status: Future     Number of Occurrences:      Standing Expiration Date: 03/09/2015    No orders of the defined types were placed in this encounter.     Preventive Screening-Counseling & Management  Smoking Status: Never Smoker Second Hand Smoking status: No smokers in home  Risk Factors Regular exercise: 3x a week  exercise Diet: works on portion size, advised no sweet tea.   Fall Risk: None   Cardiac risk factors:  advanced age (older than 30 for men, 42 for women) yes Hyperlipidemia yes, on tricor, discussed statin, patient not interested No diabetes. But at risk for diabetes Family History: Dad MI in 68s, advised once again for statin   Depression Screen Known depression. PHQ2 of 0. Well controlled  Activities of Daily Living Independent ADLs and IADLs   Hearing Difficulties: -patient declines  Cognitive Testing No reported trouble.   Normal 3 word recall  List the Names of Other Physician/Practitioners you currently use: 1.Dr. Ardis Hughs with [facility] 2. Optho Dr. Gershon Crane 3. Paula Compton, GYn 4. Dr. Ubaldo Glassing dermatology  Immunization History  Administered Date(s) Administered  . Influenza,inj,Quad PF,36+ Mos 11/15/2012  . Influenza-Unspecified 09/25/2013  . Pneumococcal Conjugate-13 03/08/2014  . Td 01/26/2003  . Zoster 12/12/2009   Required Immunizations needed today Given prevnar, needs pneumovax in a year  Screening tests- up to date Health Maintenance Due  Topic Date Due  . PNEUMOCOCCAL POLYSACCHARIDE VACCINE AGE 26 AND OVER  12/31/2013

## 2014-03-08 NOTE — Progress Notes (Signed)
Pre visit review using our clinic review tool, if applicable. No additional management support is needed unless otherwise documented below in the visit note. 

## 2014-03-08 NOTE — Patient Instructions (Signed)
Great to meet you  prevnar today, final pneumonia shot in a year  No changes to medications though I would recommend a statin to lower cardiac risk given your dad's history

## 2014-03-09 NOTE — Assessment & Plan Note (Signed)
Mild poor control on Fenofibrate, fish oil. I advised statin would be medication of choice to lower cardiac risk due to triglycerides and also has history LDL elevation. She will return for fasting lipids. I advied a statin but she declined.

## 2014-03-09 NOTE — Assessment & Plan Note (Signed)
Controlled on ambien 5mg  despite decrease from 10mg 

## 2014-03-09 NOTE — Assessment & Plan Note (Signed)
Check a1c today

## 2014-03-09 NOTE — Assessment & Plan Note (Signed)
No change. Controlled on azor.

## 2014-03-09 NOTE — Assessment & Plan Note (Signed)
Controlled on lexapro. Continue.

## 2014-03-14 ENCOUNTER — Encounter: Payer: Self-pay | Admitting: Family Medicine

## 2014-03-14 ENCOUNTER — Ambulatory Visit: Payer: Medicare Other | Admitting: Podiatry

## 2014-03-14 ENCOUNTER — Other Ambulatory Visit (INDEPENDENT_AMBULATORY_CARE_PROVIDER_SITE_OTHER): Payer: Medicare Other

## 2014-03-14 ENCOUNTER — Telehealth: Payer: Self-pay

## 2014-03-14 DIAGNOSIS — Z79899 Other long term (current) drug therapy: Secondary | ICD-10-CM | POA: Diagnosis not present

## 2014-03-14 DIAGNOSIS — E785 Hyperlipidemia, unspecified: Secondary | ICD-10-CM

## 2014-03-14 DIAGNOSIS — N183 Chronic kidney disease, stage 3 unspecified: Secondary | ICD-10-CM | POA: Insufficient documentation

## 2014-03-14 DIAGNOSIS — R739 Hyperglycemia, unspecified: Secondary | ICD-10-CM

## 2014-03-14 DIAGNOSIS — I10 Essential (primary) hypertension: Secondary | ICD-10-CM

## 2014-03-14 LAB — CBC
HCT: 34.6 % — ABNORMAL LOW (ref 36.0–46.0)
Hemoglobin: 11.7 g/dL — ABNORMAL LOW (ref 12.0–15.0)
MCHC: 33.9 g/dL (ref 30.0–36.0)
MCV: 89.9 fl (ref 78.0–100.0)
Platelets: 308 10*3/uL (ref 150.0–400.0)
RBC: 3.85 Mil/uL — ABNORMAL LOW (ref 3.87–5.11)
RDW: 13.4 % (ref 11.5–15.5)
WBC: 7.3 10*3/uL (ref 4.0–10.5)

## 2014-03-14 LAB — COMPREHENSIVE METABOLIC PANEL
ALT: 20 U/L (ref 0–35)
AST: 24 U/L (ref 0–37)
Albumin: 4.3 g/dL (ref 3.5–5.2)
Alkaline Phosphatase: 35 U/L — ABNORMAL LOW (ref 39–117)
BUN: 32 mg/dL — AB (ref 6–23)
CALCIUM: 10 mg/dL (ref 8.4–10.5)
CO2: 27 mEq/L (ref 19–32)
Chloride: 104 mEq/L (ref 96–112)
Creatinine, Ser: 1.07 mg/dL (ref 0.40–1.20)
GFR: 54.67 mL/min — ABNORMAL LOW (ref >60.00)
GLUCOSE: 96 mg/dL (ref 70–99)
POTASSIUM: 3.7 meq/L (ref 3.5–5.1)
Sodium: 136 mEq/L (ref 135–145)
Total Bilirubin: 0.5 mg/dL (ref 0.2–1.2)
Total Protein: 7.6 g/dL (ref 6.0–8.3)

## 2014-03-14 LAB — LIPID PANEL
Cholesterol: 173 mg/dL (ref 0–200)
HDL: 57.2 mg/dL (ref >39.00)
LDL Cholesterol: 103 mg/dL — ABNORMAL HIGH (ref 0–99)
NonHDL: 115.8
TRIGLYCERIDES: 66 mg/dL (ref 0.0–149.0)
Total CHOL/HDL Ratio: 3
VLDL: 13.2 mg/dL (ref 0.0–40.0)

## 2014-03-14 LAB — HEMOGLOBIN A1C: Hgb A1c MFr Bld: 6.3 % (ref 4.6–6.5)

## 2014-03-14 LAB — TSH: TSH: 4.83 u[IU]/mL — ABNORMAL HIGH (ref 0.35–4.50)

## 2014-03-14 MED ORDER — AMLODIPINE-OLMESARTAN 5-40 MG PO TABS: 1.0000 | ORAL_TABLET | Freq: Every day | ORAL | Status: DC

## 2014-03-14 NOTE — Telephone Encounter (Signed)
Medication refilled

## 2014-03-14 NOTE — Telephone Encounter (Signed)
[facility]/[address] refill request for AZOR 5-40MG  TABS

## 2014-03-25 ENCOUNTER — Telehealth: Payer: Self-pay | Admitting: Family Medicine

## 2014-03-25 MED ORDER — ZOLPIDEM TARTRATE 5 MG PO TABS: 5.0000 mg | ORAL_TABLET | Freq: Every evening | ORAL | Status: DC | PRN

## 2014-03-25 NOTE — Telephone Encounter (Signed)
Ok to refill? Didn't see anything in your last note regarding refill.

## 2014-03-25 NOTE — Telephone Encounter (Signed)
Yes, she has done well on this. I added ok to refill on her overview under insomnia for future fills. Thanks!

## 2014-03-25 NOTE — Telephone Encounter (Signed)
Medication refill faxed to [facility]

## 2014-03-25 NOTE — Telephone Encounter (Signed)
Pt request refill zolpidem (AMBIEN) 5 MG tablet Pt states she is out. [facility]

## 2014-03-25 NOTE — Telephone Encounter (Signed)
See below

## 2014-03-27 ENCOUNTER — Encounter: Payer: Self-pay | Admitting: Family Medicine

## 2014-04-24 ENCOUNTER — Other Ambulatory Visit: Payer: Self-pay | Admitting: *Deleted

## 2014-04-24 MED ORDER — FENOFIBRATE 150 MG PO CAPS: 150.0000 mg | ORAL_CAPSULE | Freq: Every day | ORAL | Status: DC

## 2014-04-25 DIAGNOSIS — H4011X1 Primary open-angle glaucoma, mild stage: Secondary | ICD-10-CM | POA: Diagnosis not present

## 2014-04-25 DIAGNOSIS — L820 Inflamed seborrheic keratosis: Secondary | ICD-10-CM | POA: Diagnosis not present

## 2014-06-19 ENCOUNTER — Other Ambulatory Visit: Payer: Self-pay | Admitting: *Deleted

## 2014-06-19 MED ORDER — ESCITALOPRAM OXALATE 20 MG PO TABS: ORAL_TABLET | ORAL | Status: DC

## 2014-07-12 ENCOUNTER — Other Ambulatory Visit: Payer: Self-pay | Admitting: Family Medicine

## 2014-07-25 ENCOUNTER — Other Ambulatory Visit: Payer: Self-pay | Admitting: Family Medicine

## 2014-07-25 NOTE — Telephone Encounter (Signed)
Age and sex restricted to 5mg . Do not fill 10mg 

## 2014-07-25 NOTE — Telephone Encounter (Signed)
ambien 5mg  is on her current med list. Does she really need both 5mg  and 10mg  filled?

## 2014-08-29 DIAGNOSIS — H2513 Age-related nuclear cataract, bilateral: Secondary | ICD-10-CM | POA: Diagnosis not present

## 2014-08-29 DIAGNOSIS — H4011X1 Primary open-angle glaucoma, mild stage: Secondary | ICD-10-CM | POA: Diagnosis not present

## 2014-09-03 ENCOUNTER — Other Ambulatory Visit: Payer: Self-pay

## 2014-09-03 DIAGNOSIS — Z1231 Encounter for screening mammogram for malignant neoplasm of breast: Secondary | ICD-10-CM

## 2014-09-06 ENCOUNTER — Ambulatory Visit: Payer: Medicare Other | Admitting: Family Medicine

## 2014-09-12 ENCOUNTER — Ambulatory Visit (INDEPENDENT_AMBULATORY_CARE_PROVIDER_SITE_OTHER): Payer: Medicare Other | Admitting: Family Medicine

## 2014-09-12 ENCOUNTER — Encounter: Payer: Self-pay | Admitting: Family Medicine

## 2014-09-12 VITALS — BP 130/68 | HR 73 | Temp 98.6°F | Wt 179.0 lb

## 2014-09-12 DIAGNOSIS — R946 Abnormal results of thyroid function studies: Secondary | ICD-10-CM

## 2014-09-12 DIAGNOSIS — I10 Essential (primary) hypertension: Secondary | ICD-10-CM | POA: Diagnosis not present

## 2014-09-12 DIAGNOSIS — R739 Hyperglycemia, unspecified: Secondary | ICD-10-CM

## 2014-09-12 DIAGNOSIS — E785 Hyperlipidemia, unspecified: Secondary | ICD-10-CM

## 2014-09-12 DIAGNOSIS — D649 Anemia, unspecified: Secondary | ICD-10-CM | POA: Insufficient documentation

## 2014-09-12 DIAGNOSIS — R7989 Other specified abnormal findings of blood chemistry: Secondary | ICD-10-CM

## 2014-09-12 DIAGNOSIS — N183 Chronic kidney disease, stage 3 unspecified: Secondary | ICD-10-CM

## 2014-09-12 LAB — CBC
HEMATOCRIT: 35.2 % — AB (ref 36.0–46.0)
Hemoglobin: 11.8 g/dL — ABNORMAL LOW (ref 12.0–15.0)
MCHC: 33.5 g/dL (ref 30.0–36.0)
MCV: 91.4 fl (ref 78.0–100.0)
PLATELETS: 308 10*3/uL (ref 150.0–400.0)
RBC: 3.86 Mil/uL — AB (ref 3.87–5.11)
RDW: 12.9 % (ref 11.5–15.5)
WBC: 7.1 10*3/uL (ref 4.0–10.5)

## 2014-09-12 LAB — TSH: TSH: 3.27 u[IU]/mL (ref 0.35–4.50)

## 2014-09-12 LAB — BASIC METABOLIC PANEL
BUN: 30 mg/dL — AB (ref 6–23)
CALCIUM: 9.8 mg/dL (ref 8.4–10.5)
CHLORIDE: 105 meq/L (ref 96–112)
CO2: 26 meq/L (ref 19–32)
Creatinine, Ser: 1.02 mg/dL (ref 0.40–1.20)
GFR: 57.68 mL/min — ABNORMAL LOW (ref >60.00)
Glucose, Bld: 94 mg/dL (ref 70–99)
Potassium: 4.2 mEq/L (ref 3.5–5.1)
Sodium: 138 mEq/L (ref 135–145)

## 2014-09-12 LAB — HEMOGLOBIN A1C: Hgb A1c MFr Bld: 6 % (ref 4.6–6.5)

## 2014-09-12 NOTE — Progress Notes (Signed)
Garret Reddish, MD  Subjective:  Ann Washington is a 64 y.o. year old very pleasant female patient who presents with:  See problem oriented charting ROS- no chest pain or shortness of breath. No myalgias  Past Medical History-  Patient Active Problem List   Diagnosis Date Noted  . CKD (chronic kidney disease), stage III 03/14/2014    Priority: Medium  . Insomnia 03/08/2014    Priority: Medium  . Hyperglycemia 12/09/2009    Priority: Medium  . Hyperlipidemia 10/13/2006    Priority: Medium  . Depression 10/13/2006    Priority: Medium  . Essential hypertension 10/10/2006    Priority: Medium  . Normocytic anemia 09/12/2014    Priority: Low  . Glaucoma 03/08/2014    Priority: Low  . Stress incontinence, female 10/12/2011    Priority: Low  . Vaginal atrophy 05/27/2011    Priority: Low  . Obesity 10/11/2008    Priority: Low  . GERD 08/25/2007    Priority: Low   Medications- reviewed and updated Current Outpatient Prescriptions  Medication Sig Dispense Refill  . AZOR 5-40 MG per tablet TAKE 1 TABLET BY MOUTH DAILY 30 tablet 5  . Calcium Carbonate-Vit D-Min 600-400 MG-UNIT TABS Take by mouth daily.      Marland Kitchen escitalopram (LEXAPRO) 20 MG tablet TAKE 1/2 TABLET BY MOUTH EVERY DAY. 15 tablet 5  . Fenofibrate 150 MG CAPS Take 1 capsule (150 mg total) by mouth daily. 90 capsule 1  . fish oil-omega-3 fatty acids 1000 MG capsule Take 2 g by mouth daily.      . Multiple Vitamin (MULTIVITAMIN) tablet Take 1 tablet by mouth daily.      Marland Kitchen omeprazole (PRILOSEC) 20 MG capsule Take 20 mg by mouth daily.    . travoprost, benzalkonium, (TRAVATAN) 0.004 % ophthalmic solution Place 1 drop into both eyes at bedtime.      Marland Kitchen zolpidem (AMBIEN) 5 MG tablet TAKE 1 TABLET BY MOUTH AT BEDTIME AS NEEDED FOR SLEEP 30 tablet 5   Objective: BP 130/68 mmHg  Pulse 73  Temp(Src) 98.6 F (37 C)  Wt 179 lb (81.194 kg) Gen: NAD, resting comfortably CV: RRR no murmurs rubs or gallops Lungs: CTAB no  crackles, wheeze, rhonchi Abdomen: soft/nontender/nondistended/normal bowel sounds. No rebound or guarding.  Ext: no edema on right, trace on left (chronic after injury-no worsening) Skin: warm, dry, no rash Neuro: grossly normal, moves all extremities, normal gait   Assessment/Plan:  Hyperlipidemia S: mild poorly controlled. On fenofibrate and fish oil. Trig controlled. LDL >100 Lab Results  Component Value Date   CHOL 173 03/14/2014   HDL 57.20 03/14/2014   LDLCALC 103* 03/14/2014   LDLDIRECT 144.5 02/29/2008   TRIG 66.0 03/14/2014   CHOLHDL 3 03/14/2014  A/P:Continue current meds:  Discussed risks likely lowered more by statin than fenofibrate. Patient declines statin. Will work on diet/exercise to get LDL down. 2016 mild to moderate carotid artery stenosis on lifeline screen by velocity- gives more weight to idea of statin but still declines   CKD (chronic kidney disease), stage III S: stable control. GFR remains in 50s today. BP controlled.  A/P:Continue current meds:  Primarily BP control with azor. Avoid nsaids.    Normocytic anemia S: Mild anemia, normocytic for 1 year. Hemorrhoid. Up to date colonoscopy. No vaginal bleeding A/P:  Hgb essentially stable- continue to follow at least yearly, consider further investigation if worsens.    Hyperglycemia S: exercise habits improved. 25 treadmill, 10 bike or 12 min ab. 2x a  week. Had been 3x a week. Trying to eat better. Down a few lbs A/P: continue- Encouraged need for healthy eating, regular exercise, weight loss. Risk of diabetes decreased bsaed on a1c from 6.3 to 6.0    Essential hypertension S: controlled.  BP Readings from Last 3 Encounters:  09/12/14 130/68  03/08/14 124/84  02/14/14 139/76  A/P:Continue current meds:  Amlodipine-olmesartan 5-40mg .     6 months f/u  Elevated TSH returned to normal range Results for orders placed or performed in visit on 09/12/14 (from the past 24 hour(s))  CBC     Status:  Abnormal   Collection Time: 09/12/14 10:42 AM  Result Value Ref Range   WBC 7.1 4.0 - 10.5 K/uL   RBC 3.86 (L) 3.87 - 5.11 Mil/uL   Platelets 308.0 150.0 - 400.0 K/uL   Hemoglobin 11.8 (L) 12.0 - 15.0 g/dL   HCT 35.2 (L) 36.0 - 46.0 %   MCV 91.4 78.0 - 100.0 fl   MCHC 33.5 30.0 - 36.0 g/dL   RDW 12.9 11.5 - 15.5 %  TSH     Status: None   Collection Time: 09/12/14 10:42 AM  Result Value Ref Range   TSH 3.27 0.35 - 4.50 uIU/mL  Hemoglobin A1c     Status: None   Collection Time: 09/12/14 10:42 AM  Result Value Ref Range   Hgb A1c MFr Bld 6.0 4.6 - 6.5 %  Basic metabolic panel     Status: Abnormal   Collection Time: 09/12/14 10:42 AM  Result Value Ref Range   Sodium 138 135 - 145 mEq/L   Potassium 4.2 3.5 - 5.1 mEq/L   Chloride 105 96 - 112 mEq/L   CO2 26 19 - 32 mEq/L   Glucose, Bld 94 70 - 99 mg/dL   BUN 30 (H) 6 - 23 mg/dL   Creatinine, Ser 1.02 0.40 - 1.20 mg/dL   Calcium 9.8 8.4 - 10.5 mg/dL   GFR 57.68 (L) >60.00 mL/min

## 2014-09-12 NOTE — Assessment & Plan Note (Signed)
S: stable control. GFR remains in 50s today. BP controlled.  A/P:Continue current meds:  Primarily BP control with azor. Avoid nsaids.

## 2014-09-12 NOTE — Patient Instructions (Addendum)
Medication Instructions:  Same, see list  Other Instructions:  Continue to work on weight loss. Hopeful cholesterol improves with these efforts. CHolesterol medicine could reduce risk of progression in carotid arteries- but you wanted to work on getting #s down without medicine  Labwork: Today before you leave  Testing/Procedures/Immunizations: Declined Hep C and HIV  Get a flu shot around october  Follow-Up (all visit scheduling, rescheduling, cancellations including labs should be scheduled at front desk): 6 month follow up for annual wellness visit after 03/10/15

## 2014-09-12 NOTE — Assessment & Plan Note (Signed)
S: controlled.  BP Readings from Last 3 Encounters:  09/12/14 130/68  03/08/14 124/84  02/14/14 139/76  A/P:Continue current meds:  Amlodipine-olmesartan 5-40mg .

## 2014-09-12 NOTE — Assessment & Plan Note (Signed)
S: mild poorly controlled. On fenofibrate and fish oil. Trig controlled. LDL >100 Lab Results  Component Value Date   CHOL 173 03/14/2014   HDL 57.20 03/14/2014   LDLCALC 103* 03/14/2014   LDLDIRECT 144.5 02/29/2008   TRIG 66.0 03/14/2014   CHOLHDL 3 03/14/2014  A/P:Continue current meds:  Discussed risks likely lowered more by statin than fenofibrate. Patient declines statin. Will work on diet/exercise to get LDL down. 2016 mild to moderate carotid artery stenosis on lifeline screen by velocity- gives more weight to idea of statin but still declines

## 2014-09-12 NOTE — Assessment & Plan Note (Signed)
S: exercise habits improved. 25 treadmill, 10 bike or 12 min ab. 2x a week. Had been 3x a week. Trying to eat better. Down a few lbs A/P: continue- Encouraged need for healthy eating, regular exercise, weight loss. Risk of diabetes decreased bsaed on a1c from 6.3 to 6.0

## 2014-09-12 NOTE — Assessment & Plan Note (Signed)
S: Mild anemia, normocytic for 1 year. Hemorrhoid. Up to date colonoscopy. No vaginal bleeding A/P:  Hgb essentially stable- continue to follow at least yearly, consider further investigation if worsens.

## 2014-10-02 ENCOUNTER — Telehealth: Payer: Self-pay

## 2014-10-02 NOTE — Telephone Encounter (Signed)
LM on pt vm tcb  

## 2014-10-02 NOTE — Telephone Encounter (Signed)
The pt called and is hoping to get a cheaper option for the Azor 5/40 rx  Pt callback - [phone]

## 2014-10-02 NOTE — Telephone Encounter (Signed)
See below

## 2014-10-02 NOTE — Telephone Encounter (Signed)
Ask her to check with her pharmacy and see if this option would be cheaper Amlodipine 5mg  daily #30 with 5 refillsAnd  Valsartan 320mg  daily #30 with 5 refills  If it is, or if she just wants [place] to send this in- that is reasonable

## 2014-10-04 MED ORDER — AMLODIPINE BESYLATE 5 MG PO TABS: 5.0000 mg | ORAL_TABLET | Freq: Every day | ORAL | Status: DC

## 2014-10-04 NOTE — Telephone Encounter (Signed)
Pt returned call and will check with pharmacy and notify us of which is cheaper.

## 2014-10-04 NOTE — Telephone Encounter (Signed)
Medication sent into pharmacy  

## 2014-10-04 NOTE — Telephone Encounter (Signed)
Amlodipine 5 mg is the cheapest  [facility] [address]

## 2014-10-10 ENCOUNTER — Ambulatory Visit
Admission: RE | Admit: 2014-10-10 | Discharge: 2014-10-10 | Disposition: A | Discharge status: Home / Self Care | Payer: Medicare Other | Source: Ambulatory Visit

## 2014-10-10 DIAGNOSIS — Z1231 Encounter for screening mammogram for malignant neoplasm of breast: Secondary | ICD-10-CM

## 2014-10-22 ENCOUNTER — Other Ambulatory Visit: Payer: Self-pay | Admitting: *Deleted

## 2014-10-22 MED ORDER — FENOFIBRATE 150 MG PO CAPS: 150.0000 mg | ORAL_CAPSULE | Freq: Every day | ORAL | Status: DC

## 2014-10-24 ENCOUNTER — Ambulatory Visit (INDEPENDENT_AMBULATORY_CARE_PROVIDER_SITE_OTHER): Payer: Medicare Other | Admitting: Podiatry

## 2014-10-24 ENCOUNTER — Encounter: Payer: Self-pay | Admitting: Podiatry

## 2014-10-24 VITALS — BP 144/72 | HR 69 | Resp 12

## 2014-10-24 DIAGNOSIS — M76822 Posterior tibial tendinitis, left leg: Secondary | ICD-10-CM

## 2014-10-24 DIAGNOSIS — M7752 Other enthesopathy of left foot: Secondary | ICD-10-CM

## 2014-10-24 NOTE — Progress Notes (Signed)
She presents today with a chief complaint of recurrent pain to the medial aspect of the left foot and ankle. She points to the medial malleolus and the posterior tibial tendon.   Objective : She has pain on palpation of the posterior tibial tendon. Considerable amount of swelling of the left lower extremity with large varicosities in the posterior leg. The pitting edema is more than likely from these varicosities.  Assessment: Multiple varicosities left leg. Posterior tibial tendinitis. History of fracture to the foot.   Plan : I injected the point of maximal tenderness today with Kenalog and local anesthesia. Suggested that she follow-up with a vein Dr.. I will follow up with her on an as-needed basis.

## 2014-10-28 ENCOUNTER — Ambulatory Visit: Payer: Medicare Other | Admitting: Family Medicine

## 2014-12-05 DIAGNOSIS — H401131 Primary open-angle glaucoma, bilateral, mild stage: Secondary | ICD-10-CM | POA: Diagnosis not present

## 2014-12-10 ENCOUNTER — Encounter: Payer: Self-pay | Admitting: Podiatry

## 2014-12-10 ENCOUNTER — Ambulatory Visit (INDEPENDENT_AMBULATORY_CARE_PROVIDER_SITE_OTHER): Payer: Medicare Other | Admitting: Podiatry

## 2014-12-10 ENCOUNTER — Ambulatory Visit (INDEPENDENT_AMBULATORY_CARE_PROVIDER_SITE_OTHER): Payer: Medicare Other

## 2014-12-10 ENCOUNTER — Ambulatory Visit: Payer: Medicare Other | Admitting: Podiatry

## 2014-12-10 VITALS — BP 147/88 | HR 80 | Resp 16

## 2014-12-10 DIAGNOSIS — M76822 Posterior tibial tendinitis, left leg: Secondary | ICD-10-CM

## 2014-12-10 DIAGNOSIS — M25572 Pain in left ankle and joints of left foot: Secondary | ICD-10-CM | POA: Diagnosis not present

## 2014-12-10 DIAGNOSIS — B351 Tinea unguium: Secondary | ICD-10-CM | POA: Diagnosis not present

## 2014-12-10 DIAGNOSIS — L600 Ingrowing nail: Secondary | ICD-10-CM

## 2014-12-10 MED ORDER — NEOMYCIN-POLYMYXIN-HC 3.5-10000-1 OT SOLN: OTIC | Status: DC

## 2014-12-10 NOTE — Patient Instructions (Signed)

## 2014-12-10 NOTE — Progress Notes (Signed)
She presents today with chief complaint of ingrown toenail fibular border hallux right. States his been present for many years but he seems to be getting worse lately and that I am tired dealing with it. She still having pain to the medial aspect of her left ankle as well.  Objective: Vital signs are stable she is alert and oriented 3. Pulses are strongly palpable. She has no pain on palpation medial malleolus. Radiograph does insert a very large fragment of bone just beneath the medial malleolus. The majority of her pain is located over the posterior tibial tendon as it courses beneath the medial malleolus extending to the navicular tuberosity. Last visit when we performed her injection this had fluctuance within the tendon sheath. At this point that has resolved. Cutaneous evaluation also demonstrates sharp incurvated nail margin along the fibular border of the hallux right with distal onychocryptosis. Mild erythema no cellulitis drainage or odor.  Assessment: Resolving posterior tibial tendinitis left. Ingrown nail. The border hallux right.  Plan: Discussed etiology pathology conservative versus surgical therapies. At this point we performed a chemical matrixectomy fibular border. She tolerated this procedure well after local anesthesia was administered. But a prescription for Cortisporin Otic to be applied twice daily after soaking in Betadine and warm water. She will change to Epsom salts and water in 1 week and then follow up with me the week after that.  Roselind Messier DPM

## 2014-12-22 ENCOUNTER — Other Ambulatory Visit: Payer: Self-pay | Admitting: Family Medicine

## 2014-12-25 DIAGNOSIS — Z1389 Encounter for screening for other disorder: Secondary | ICD-10-CM | POA: Diagnosis not present

## 2014-12-25 DIAGNOSIS — Z6833 Body mass index (BMI) 33.0-33.9, adult: Secondary | ICD-10-CM | POA: Diagnosis not present

## 2014-12-25 DIAGNOSIS — Z01419 Encounter for gynecological examination (general) (routine) without abnormal findings: Secondary | ICD-10-CM | POA: Diagnosis not present

## 2014-12-25 DIAGNOSIS — Z130 Encounter for screening for diseases of the blood and blood-forming organs and certain disorders involving the immune mechanism: Secondary | ICD-10-CM | POA: Diagnosis not present

## 2015-01-09 ENCOUNTER — Ambulatory Visit: Payer: Medicare Other | Admitting: Podiatry

## 2015-01-20 ENCOUNTER — Other Ambulatory Visit: Payer: Self-pay | Admitting: Family Medicine

## 2015-01-21 NOTE — Telephone Encounter (Signed)
Yes thanks 

## 2015-01-21 NOTE — Telephone Encounter (Signed)
Refill ok? 

## 2015-02-06 DIAGNOSIS — H401131 Primary open-angle glaucoma, bilateral, mild stage: Secondary | ICD-10-CM | POA: Diagnosis not present

## 2015-02-24 ENCOUNTER — Other Ambulatory Visit: Payer: Self-pay | Admitting: Family Medicine

## 2015-03-13 ENCOUNTER — Other Ambulatory Visit: Payer: Self-pay | Admitting: Family Medicine

## 2015-03-13 ENCOUNTER — Ambulatory Visit (INDEPENDENT_AMBULATORY_CARE_PROVIDER_SITE_OTHER): Payer: Medicare Other | Admitting: Family Medicine

## 2015-03-13 ENCOUNTER — Encounter: Payer: Self-pay | Admitting: Family Medicine

## 2015-03-13 VITALS — BP 150/90 | HR 72 | Temp 97.9°F | Ht 63.0 in | Wt 180.0 lb

## 2015-03-13 DIAGNOSIS — Z23 Encounter for immunization: Secondary | ICD-10-CM | POA: Diagnosis not present

## 2015-03-13 DIAGNOSIS — I10 Essential (primary) hypertension: Secondary | ICD-10-CM | POA: Diagnosis not present

## 2015-03-13 DIAGNOSIS — R6889 Other general symptoms and signs: Secondary | ICD-10-CM

## 2015-03-13 DIAGNOSIS — E785 Hyperlipidemia, unspecified: Secondary | ICD-10-CM

## 2015-03-13 DIAGNOSIS — N183 Chronic kidney disease, stage 3 unspecified: Secondary | ICD-10-CM

## 2015-03-13 DIAGNOSIS — Z0001 Encounter for general adult medical examination with abnormal findings: Secondary | ICD-10-CM | POA: Diagnosis not present

## 2015-03-13 DIAGNOSIS — Z20828 Contact with and (suspected) exposure to other viral communicable diseases: Secondary | ICD-10-CM

## 2015-03-13 DIAGNOSIS — R739 Hyperglycemia, unspecified: Secondary | ICD-10-CM

## 2015-03-13 LAB — COMPREHENSIVE METABOLIC PANEL
ALK PHOS: 42 U/L (ref 39–117)
ALT: 16 U/L (ref 0–35)
AST: 19 U/L (ref 0–37)
Albumin: 4.6 g/dL (ref 3.5–5.2)
BILIRUBIN TOTAL: 0.6 mg/dL (ref 0.2–1.2)
BUN: 21 mg/dL (ref 6–23)
CALCIUM: 10.5 mg/dL (ref 8.4–10.5)
CHLORIDE: 104 meq/L (ref 96–112)
CO2: 28 mEq/L (ref 19–32)
CREATININE: 0.93 mg/dL (ref 0.40–1.20)
GFR: 64.07 mL/min (ref >60.00)
Glucose, Bld: 100 mg/dL — ABNORMAL HIGH (ref 70–99)
Potassium: 4.7 mEq/L (ref 3.5–5.1)
SODIUM: 141 meq/L (ref 135–145)
TOTAL PROTEIN: 7.7 g/dL (ref 6.0–8.3)

## 2015-03-13 LAB — CBC
HCT: 38.8 % (ref 36.0–46.0)
Hemoglobin: 13.1 g/dL (ref 12.0–15.0)
MCHC: 33.6 g/dL (ref 30.0–36.0)
MCV: 89.3 fl (ref 78.0–100.0)
Platelets: 303 10*3/uL (ref 150.0–400.0)
RBC: 4.35 Mil/uL (ref 3.87–5.11)
RDW: 13.4 % (ref 11.5–15.5)
WBC: 7.7 10*3/uL (ref 4.0–10.5)

## 2015-03-13 LAB — LIPID PANEL
CHOL/HDL RATIO: 3
Cholesterol: 182 mg/dL (ref 0–200)
HDL: 61.1 mg/dL (ref >39.00)
LDL CALC: 107 mg/dL — AB (ref 0–99)
NONHDL: 121
TRIGLYCERIDES: 69 mg/dL (ref 0.0–149.0)
VLDL: 13.8 mg/dL (ref 0.0–40.0)

## 2015-03-13 LAB — HEMOGLOBIN A1C: Hgb A1c MFr Bld: 6.1 % (ref 4.6–6.5)

## 2015-03-13 LAB — TSH: TSH: 3.55 u[IU]/mL (ref 0.35–4.50)

## 2015-03-13 MED ORDER — PRAVASTATIN SODIUM 20 MG PO TABS: 20.0000 mg | ORAL_TABLET | Freq: Every day | ORAL | Status: DC

## 2015-03-13 MED ORDER — AMLODIPINE BESYLATE-VALSARTAN 5-320 MG PO TABS: 1.0000 | ORAL_TABLET | Freq: Every day | ORAL | Status: DC

## 2015-03-13 NOTE — Assessment & Plan Note (Signed)
S: GFR is actually slightly better today (has been off ace-i)  A/P: will need to repeat bmet at follow up to make sure not more than 30% worsening of creatinien.

## 2015-03-13 NOTE — Assessment & Plan Note (Signed)
S: poor control on amlodipine 5mg  alone. Per phone note was supposed to be on valsartan as well BP Readings from Last 3 Encounters:  03/13/15 150/90  12/10/14 147/88  10/24/14 144/72  A/P: change to exforget (amlodipine 5 and valsartan 320mg ) with 2-3 week blood pressure check

## 2015-03-13 NOTE — Assessment & Plan Note (Signed)
S: has not changed many health habits. a1c remains elevated A/P: discussed healthy lifestyle changes- if remains above 6 with next check consider metformin

## 2015-03-13 NOTE — Progress Notes (Signed)
Ann Reddish, MD Phone: [phone]  Subjective:  Patient presents today for their annual wellness visit.    Preventive Screening-Counseling & Management  Smoking Status: Never Smoker Second Hand Smoking status: No smokers in home  Risk Factors Regular exercise:  Last year 3x a week, has maintained that Diet: . We discussed cutting out sweet tea last year, has not done that yet. Working on portion control. Wants to watch sweets/carbs Fall Risk: None   Cardiac risk factors:  advanced age (older than 59 for men, 4 for women) : yes Hyperlipidemia yes on tricor, patient not interested in statin No diabetes. But at risk for diabetes Family History: Dad with MI in 78s- indication for statin increased   Depression Screen None. PHQ2 0   Activities of Daily Living Independent ADLs and IADLs   Hearing Difficulties: -patient declines more than minor issues  Cognitive Testing No reported trouble.   Normal 3 word recall  List the Names of Other Physician/Practitioners you currently use: -Dr. Ardis Hughs with [facility] - Optho Dr. Gershon Crane - Paula Compton, GYN - Dr. Ubaldo Glassing dermatology - Dr. Milinda Pointer podiatry  Immunization History  Administered Date(s) Administered  . Influenza,inj,Quad PF,36+ Mos 11/15/2012  . Influenza-Unspecified 09/25/2013, 11/10/2014  . Pneumococcal Conjugate-13 03/08/2014  . Pneumococcal Polysaccharide-23 03/13/2015  . Td 01/26/2003  . Zoster 12/12/2009   Required Immunizations needed today :  -Tdap but not covered by insurance -Pneumovax 23 offered/given  -Influenza given at work  Screening tests- up to date Health Maintenance Due  Topic Date Due  . Hepatitis C Screening  - today 02/14/1950   ROS- No pertinent positives discovered in course of AWV For problem oriented visit: No chest pain or shortness of breath. No headache or blurry vision.   The following were reviewed and entered/updated in epic: Past Medical History  Diagnosis  Date  . Depression   . GERD (gastroesophageal reflux disease)   . Hyperlipidemia   . Hypertension   . CIN I (cervical intraepithelial neoplasia I)     around age 60  . Glaucoma   . Snoring 03/08/2008    Tested 2007 per patient-unrevealing for sleep apnea    . 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 CKD (chronic kidney disease), stage III 03/14/2014    Priority: Medium  . Insomnia 03/08/2014    Priority: Medium  . Hyperglycemia 12/09/2009    Priority: Medium  . Hyperlipidemia 10/13/2006    Priority: Medium  . Depression 10/13/2006    Priority: Medium  . Essential hypertension 10/10/2006    Priority: Medium  . Normocytic anemia 09/12/2014    Priority: Low  . Glaucoma 03/08/2014    Priority: Low  . Stress incontinence, female 10/12/2011    Priority: Low  . Vaginal atrophy 05/27/2011    Priority: Low  . Obesity 10/11/2008    Priority: Low  . GERD 08/25/2007    Priority: Low   Past Surgical History  Procedure Laterality Date  . Colposcopy      age 69  . Gynecologic cryosurgery      age 52  . Tubal ligation    . Hammer toe surgery    . Colonoscopy      Family History  Problem Relation Age of Onset  . Diabetes Sister   . Breast cancer Sister     Age 68  . Hypertension Sister   . Alzheimer's disease  Mother   . Hip fracture Mother   . Osteoporosis Mother   . Deep vein thrombosis Father   . Heart disease Father     MI 25s, nonsmoker  . Hypertension Brother   . Colon cancer Neg Hx     Medications- reviewed and updated Current Outpatient Prescriptions  Medication Sig Dispense Refill  . amLODipine (NORVASC) 5 MG tablet TAKE 1 TABLET(5 MG) BY MOUTH DAILY 90 tablet 4  . Calcium Carbonate-Vit D-Min 600-400 MG-UNIT TABS Take by mouth daily.      Marland Kitchen escitalopram (LEXAPRO) 20 MG tablet TAKE 1/2 TABLET BY MOUTH EVERY DAY 15 tablet 5  . Fenofibrate 150 MG CAPS Take 1  capsule (150 mg total) by mouth daily. 90 capsule 1  . fish oil-omega-3 fatty acids 1000 MG capsule Take 2 g by mouth daily.      . Multiple Vitamin (MULTIVITAMIN) tablet Take 1 tablet by mouth daily.      Marland Kitchen neomycin-polymyxin-hydrocortisone (CORTISPORIN) otic solution Apply one to two drops to toe after soaking twice daily. 10 mL 0  . omeprazole (PRILOSEC) 20 MG capsule Take 20 mg by mouth daily.    . travoprost, benzalkonium, (TRAVATAN) 0.004 % ophthalmic solution Place 1 drop into both eyes at bedtime.      Marland Kitchen zolpidem (AMBIEN) 5 MG tablet TAKE 1 TABLET BY MOUTH EVERY NIGHT AT BEDTIME AS NEEDED FOR SLEEP 30 tablet 5   Allergies-reviewed and updated Allergies  Allergen Reactions  . Amlodipine Besy-Benazepril Hcl Other (See Comments)    Edema to ankle with redness    Social History   Social History  . Marital Status: Married    Spouse Name: N/A  . Number of Children: N/A  . Years of Education: N/A   Social History Main Topics  . Smoking status: Never Smoker   . Smokeless tobacco: Never Used  . Alcohol Use: No  . Drug Use: No  . Sexual Activity: Yes    Birth Control/ Protection: Surgical   Other Topics Concern  . Not on file   Social History Narrative   Married (husband patient at [facility]). 3 children. 7 grandkids.       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       Hobbies: time with grandkids, walk when weather nice, reading, tv    Objective: BP 150/90 mmHg  Pulse 72  Temp(Src) 97.9 F (36.6 C)  Ht 5\' 3"  (1.6 m)  Wt 180 lb (81.647 kg)  BMI 31.89 kg/m2 Gen: NAD, resting comfortably HEENT: Mucous membranes are moist. Oropharynx normal Neck: no thyromegaly CV: RRR no murmurs rubs or gallops Lungs: CTAB no crackles, wheeze, rhonchi Abdomen: soft/nontender/nondistended/normal bowel sounds. No rebound or guarding.  Ext: no edema Skin: warm, dry Neuro: grossly normal, moves all extremities, PERRLA  Assessment/Plan:  AWV completed  Insomnia  controlled on ambien 5mg . Willing to refill  Depression controlled on lexapro 10mg  (1/2 tab of 20mg )  Hyperlipidemia S: mild poorly controlled on fenofibrate and fish oil. No myalgias. Fenofibrate cost is high.  Lab Results  Component Value Date   CHOL 182 03/13/2015   HDL 61.10 03/13/2015   LDLCALC 107* 03/13/2015   LDLDIRECT 144.5 02/29/2008   TRIG 69.0 03/13/2015   CHOLHDL 3 03/13/2015   A/P: trial pravastatin which has better indications for primary prevention. Consider repeat trig, direct ldl at follow up. Used low dose due to hyperglycemia and diabetes risk    Essential hypertension S: poor control on amlodipine 5mg  alone. Per phone  note was supposed to be on valsartan as well BP Readings from Last 3 Encounters:  03/13/15 150/90  12/10/14 147/88  10/24/14 144/72  A/P: change to exforget (amlodipine 5 and valsartan 320mg ) with 2-3 week blood pressure check    CKD (chronic kidney disease), stage III S: GFR is actually slightly better today (has been off ace-i)  A/P: will need to repeat bmet at follow up to make sure not more than 30% worsening of creatinien.    Hyperglycemia S: has not changed many health habits. a1c remains elevated A/P: discussed healthy lifestyle changes- if remains above 6 with next check consider metformin    Return in about 3 weeks (around 04/03/2015). Return precautions advised.   Orders Placed This Encounter  Procedures  . Pneumococcal polysaccharide vaccine 23-valent greater than or equal to 2yo subcutaneous/IM  . Hemoglobin A1c    [place]  . Lipid panel    [place]    Order Specific Question:  Has the patient fasted?    Answer:  No  . CBC    [place]  . Comprehensive metabolic panel    [place]    Order Specific Question:  Has the patient fasted?    Answer:  No  . TSH    [place]  . Hepatitis C antibody, reflex    [facility]    Meds ordered this encounter  Medications  . pravastatin (PRAVACHOL) 20 MG tablet    Sig: Take 1 tablet  (20 mg total) by mouth daily.    Dispense:  90 tablet    Refill:  3  . amLODipine-valsartan (EXFORGE) 5-320 MG tablet    Sig: Take 1 tablet by mouth daily.    Dispense:  90 tablet    Refill:  3

## 2015-03-13 NOTE — Assessment & Plan Note (Signed)
S: mild poorly controlled on fenofibrate and fish oil. No myalgias. Fenofibrate cost is high.  Lab Results  Component Value Date   CHOL 182 03/13/2015   HDL 61.10 03/13/2015   LDLCALC 107* 03/13/2015   LDLDIRECT 144.5 02/29/2008   TRIG 69.0 03/13/2015   CHOLHDL 3 03/13/2015   A/P: trial pravastatin which has better indications for primary prevention. Consider repeat trig, direct ldl at follow up. Used low dose due to hyperglycemia and diabetes risk

## 2015-03-13 NOTE — Patient Instructions (Addendum)
Would like to get a copy of your last pap smear. Sign release of information at the check out desk for this please.   Final pneumonia shot ([id number]) received today.  I want you to check on the price of  1. Pravastatin 20mg  for cholesterol 2. Exforge (combo of amlodipine and valsartan)  Update me if you can afford both- if not I may use another product  Follow up 2-3 weeks for blood pressure recheck. We can look at ears then too      Ann Washington , Thank you for taking time to come for your Medicare Wellness Visit. I appreciate your ongoing commitment to your health goals. Please review the following plan we discussed and let me know if I can assist you in the future.   These are the goals we discussed: 1. Cut down on sweet tea 2. Cut down on carbs and sweets 3. Increase exercise to 4 days a week if possible 4. Goal in 6 months weight down 10 lbs   This is a list of the screening recommended for you and due dates:  Health Maintenance  Topic Date Due  .  Hepatitis C: One time screening is recommended by Center for Disease Control  (CDC) for  adults born from [birth year] through 1967.   [birth year]  . Tetanus Vaccine  01/25/2013  . Flu Shot  08/26/2015  . Mammogram  10/09/2016  . Colon Cancer Screening  08/10/2022  . DEXA scan (bone density measurement)  Completed  . Shingles Vaccine  Completed  . Pneumonia vaccines  Completed

## 2015-03-14 LAB — HEPATITIS C ANTIBODY: HCV Ab: NEGATIVE

## 2015-03-17 ENCOUNTER — Telehealth: Payer: Self-pay | Admitting: Family Medicine

## 2015-03-17 NOTE — Telephone Encounter (Signed)
Labs upfront for pt pick up.

## 2015-03-17 NOTE — Telephone Encounter (Signed)
Patient is coming in today around 2:00 to pick up a copy of her lab result from last Thursday

## 2015-03-27 ENCOUNTER — Ambulatory Visit (INDEPENDENT_AMBULATORY_CARE_PROVIDER_SITE_OTHER): Payer: Medicare Other | Admitting: Family Medicine

## 2015-03-27 ENCOUNTER — Encounter: Payer: Self-pay | Admitting: Family Medicine

## 2015-03-27 VITALS — BP 138/82 | HR 74 | Temp 98.5°F | Wt 178.0 lb

## 2015-03-27 DIAGNOSIS — R739 Hyperglycemia, unspecified: Secondary | ICD-10-CM

## 2015-03-27 DIAGNOSIS — N183 Chronic kidney disease, stage 3 unspecified: Secondary | ICD-10-CM

## 2015-03-27 DIAGNOSIS — I10 Essential (primary) hypertension: Secondary | ICD-10-CM | POA: Diagnosis not present

## 2015-03-27 DIAGNOSIS — E785 Hyperlipidemia, unspecified: Secondary | ICD-10-CM | POA: Diagnosis not present

## 2015-03-27 LAB — BASIC METABOLIC PANEL
BUN: 25 mg/dL — AB (ref 6–23)
CHLORIDE: 105 meq/L (ref 96–112)
CO2: 26 meq/L (ref 19–32)
Calcium: 9.7 mg/dL (ref 8.4–10.5)
Creatinine, Ser: 0.97 mg/dL (ref 0.40–1.20)
GFR: 61.02 mL/min (ref >60.00)
Glucose, Bld: 94 mg/dL (ref 70–99)
POTASSIUM: 3.7 meq/L (ref 3.5–5.1)
Sodium: 139 mEq/L (ref 135–145)

## 2015-03-27 NOTE — Assessment & Plan Note (Signed)
Patient does not want to take metformin at this time- wants to work on diet and weight loss instead and exercise.

## 2015-03-27 NOTE — Assessment & Plan Note (Signed)
S: fenofibrate has been expensive and triglycerides are reasonable. We discussed again how statins likely lower cardiac risk more than fenofibrate. She has not started pravastatin yet- wants to finish fenofibrate. She is also anxious about muscle cramps on statins.  A/P: we also discussed DM risk increase on statins- we will be monitoring- continue transition to pravastatin

## 2015-03-27 NOTE — Patient Instructions (Addendum)
Blood pressure looks much better with recent combo pill BP Readings from Last 3 Encounters:  03/27/15 138/82  03/13/15 150/90  12/10/14 147/88  Goal <140/90. You are close to limits so we will watch closely.   Bloodwork before you go  Labs at follow up to check bad cholesterol and LDL once you are on pravastatin and off expensive cholesterol medicine

## 2015-03-27 NOTE — Progress Notes (Signed)
Garret Reddish, MD  Subjective:  Ann Washington is a 65 y.o. year old very pleasant female patient who presents for/with See problem oriented charting ROS- No chest pain or shortness of breath. No headache or blurry vision.   Past Medical History-  Patient Active Problem List   Diagnosis Date Noted  . CKD (chronic kidney disease), stage III 03/14/2014    Priority: Medium  . Insomnia 03/08/2014    Priority: Medium  . Hyperglycemia 12/09/2009    Priority: Medium  . Hyperlipidemia 10/13/2006    Priority: Medium  . Depression 10/13/2006    Priority: Medium  . Essential hypertension 10/10/2006    Priority: Medium  . Normocytic anemia 09/12/2014    Priority: Low  . Glaucoma 03/08/2014    Priority: Low  . Stress incontinence, female 10/12/2011    Priority: Low  . Vaginal atrophy 05/27/2011    Priority: Low  . Obesity 10/11/2008    Priority: Low  . GERD 08/25/2007    Priority: Low    Medications- reviewed and updated Current Outpatient Prescriptions  Medication Sig Dispense Refill  . amLODipine-valsartan (EXFORGE) 5-320 MG tablet Take 1 tablet by mouth daily. 90 tablet 3  . Calcium Carbonate-Vit D-Min 600-400 MG-UNIT TABS Take by mouth daily.      Marland Kitchen escitalopram (LEXAPRO) 20 MG tablet TAKE 1/2 TABLET BY MOUTH EVERY DAY 15 tablet 5  . fish oil-omega-3 fatty acids 1000 MG capsule Take 2 g by mouth daily.      . Multiple Vitamin (MULTIVITAMIN) tablet Take 1 tablet by mouth daily.      Marland Kitchen omeprazole (PRILOSEC) 20 MG capsule Take 20 mg by mouth daily.    . pravastatin (PRAVACHOL) 20 MG tablet Take 1 tablet (20 mg total) by mouth daily. 90 tablet 3  . travoprost, benzalkonium, (TRAVATAN) 0.004 % ophthalmic solution Place 1 drop into both eyes at bedtime.      Marland Kitchen neomycin-polymyxin-hydrocortisone (CORTISPORIN) otic solution Apply one to two drops to toe after soaking twice daily. (Patient not taking: Reported on 03/13/2015) 10 mL 0  . zolpidem (AMBIEN) 5 MG tablet TAKE 1 TABLET BY  MOUTH EVERY NIGHT AT BEDTIME AS NEEDED FOR SLEEP (Patient not taking: Reported on 03/13/2015) 30 tablet 5   No current facility-administered medications for this visit.    Objective: BP 138/82 mmHg  Pulse 74  Temp(Src) 98.5 F (36.9 C)  Wt 178 lb (80.74 kg) Gen: NAD, resting comfortably CV: RRR no murmurs rubs or gallops Lungs: CTAB no crackles, wheeze, rhonchi Abdomen: soft/nontender/nondistended/normal bowel sounds. overweight Ext: no edema Skin: warm, dry Neuro: grossly normal, moves all extremities  Assessment/Plan:  Hyperlipidemia S: fenofibrate has been expensive and triglycerides are reasonable. We discussed again how statins likely lower cardiac risk more than fenofibrate. She has not started pravastatin yet- wants to finish fenofibrate. She is also anxious about muscle cramps on statins.  A/P: we also discussed DM risk increase on statins- we will be monitoring- continue transition to pravastatin  Essential hypertension S: controlled on exforge (amlodipine 5mg  and valsartan 320mg ) BP Readings from Last 3 Encounters:  03/27/15 138/82  03/13/15 150/90  12/10/14 147/88  A/P:Continue current medications- improved control compared to previous measures on amlodipine alone   CKD (chronic kidney disease), stage III S: given recent start on exforge needs repeat GFR A/P: repeat GFR today and continue as long as <30% increase in Creatinine   Hyperglycemia Patient does not want to take metformin at this time- wants to work on diet and weight loss  instead and exercise.   Husband reportedly with flu Monday but No high fever or crushing fatigue. Test negative. But states doctor told him he had flu. Considered tamiflu with patient but given atypical case did not use prophylaxis. She will call me if symptomatic and may get flu test or treat over phone depending on symptoms  Return in about 6 months (around 09/27/2015) for follow up- or sooner if needed. Return precautions advised.    Orders Placed This Encounter  Procedures  . Basic metabolic panel    [place]

## 2015-03-27 NOTE — Assessment & Plan Note (Signed)
S: controlled on exforge (amlodipine 5mg  and valsartan 320mg ) BP Readings from Last 3 Encounters:  03/27/15 138/82  03/13/15 150/90  12/10/14 147/88  A/P:Continue current medications- improved control compared to previous measures on amlodipine alone

## 2015-03-27 NOTE — Assessment & Plan Note (Signed)
S: given recent start on exforge needs repeat GFR A/P: repeat GFR today and continue as long as <30% increase in Creatinine

## 2015-04-09 DIAGNOSIS — D1801 Hemangioma of skin and subcutaneous tissue: Secondary | ICD-10-CM | POA: Diagnosis not present

## 2015-04-09 DIAGNOSIS — D225 Melanocytic nevi of trunk: Secondary | ICD-10-CM | POA: Diagnosis not present

## 2015-04-09 DIAGNOSIS — L738 Other specified follicular disorders: Secondary | ICD-10-CM | POA: Diagnosis not present

## 2015-04-09 DIAGNOSIS — L814 Other melanin hyperpigmentation: Secondary | ICD-10-CM | POA: Diagnosis not present

## 2015-04-09 DIAGNOSIS — L821 Other seborrheic keratosis: Secondary | ICD-10-CM | POA: Diagnosis not present

## 2015-05-08 DIAGNOSIS — H401131 Primary open-angle glaucoma, bilateral, mild stage: Secondary | ICD-10-CM | POA: Diagnosis not present

## 2015-06-20 ENCOUNTER — Other Ambulatory Visit: Payer: Self-pay | Admitting: Family Medicine

## 2015-07-17 ENCOUNTER — Other Ambulatory Visit: Payer: Self-pay | Admitting: Family Medicine

## 2015-07-17 NOTE — Telephone Encounter (Signed)
Ok to refill Ambien.  

## 2015-07-17 NOTE — Telephone Encounter (Signed)
Yes thanks 

## 2015-07-18 ENCOUNTER — Other Ambulatory Visit: Payer: Self-pay

## 2015-07-18 MED ORDER — ZOLPIDEM TARTRATE 5 MG PO TABS: 5.0000 mg | ORAL_TABLET | Freq: Every evening | ORAL | Status: DC | PRN

## 2015-07-18 NOTE — Telephone Encounter (Signed)
Prescription printed and faxed to pharmacy 

## 2015-08-13 ENCOUNTER — Other Ambulatory Visit: Payer: Self-pay | Admitting: Obstetrics and Gynecology

## 2015-08-13 DIAGNOSIS — Z1231 Encounter for screening mammogram for malignant neoplasm of breast: Secondary | ICD-10-CM

## 2015-09-05 DIAGNOSIS — H401111 Primary open-angle glaucoma, right eye, mild stage: Secondary | ICD-10-CM | POA: Diagnosis not present

## 2015-09-05 DIAGNOSIS — H2513 Age-related nuclear cataract, bilateral: Secondary | ICD-10-CM | POA: Diagnosis not present

## 2015-09-05 DIAGNOSIS — H524 Presbyopia: Secondary | ICD-10-CM | POA: Diagnosis not present

## 2015-09-05 DIAGNOSIS — H401122 Primary open-angle glaucoma, left eye, moderate stage: Secondary | ICD-10-CM | POA: Diagnosis not present

## 2015-10-02 ENCOUNTER — Ambulatory Visit (INDEPENDENT_AMBULATORY_CARE_PROVIDER_SITE_OTHER): Payer: Medicare Other | Admitting: Family Medicine

## 2015-10-02 ENCOUNTER — Encounter: Payer: Self-pay | Admitting: Family Medicine

## 2015-10-02 DIAGNOSIS — E785 Hyperlipidemia, unspecified: Secondary | ICD-10-CM

## 2015-10-02 DIAGNOSIS — I10 Essential (primary) hypertension: Secondary | ICD-10-CM | POA: Diagnosis not present

## 2015-10-02 DIAGNOSIS — N183 Chronic kidney disease, stage 3 unspecified: Secondary | ICD-10-CM

## 2015-10-02 DIAGNOSIS — R739 Hyperglycemia, unspecified: Secondary | ICD-10-CM

## 2015-10-02 LAB — COMPREHENSIVE METABOLIC PANEL
ALT: 14 U/L (ref 0–35)
AST: 17 U/L (ref 0–37)
Albumin: 4.3 g/dL (ref 3.5–5.2)
Alkaline Phosphatase: 59 U/L (ref 39–117)
BUN: 19 mg/dL (ref 6–23)
CALCIUM: 9.3 mg/dL (ref 8.4–10.5)
CO2: 28 meq/L (ref 19–32)
Chloride: 106 mEq/L (ref 96–112)
Creatinine, Ser: 0.73 mg/dL (ref 0.40–1.20)
GFR: 84.58 mL/min (ref >60.00)
GLUCOSE: 97 mg/dL (ref 70–99)
POTASSIUM: 4.2 meq/L (ref 3.5–5.1)
Sodium: 139 mEq/L (ref 135–145)
Total Bilirubin: 0.7 mg/dL (ref 0.2–1.2)
Total Protein: 7.3 g/dL (ref 6.0–8.3)

## 2015-10-02 LAB — CBC
HEMATOCRIT: 38.9 % (ref 36.0–46.0)
HEMOGLOBIN: 13.2 g/dL (ref 12.0–15.0)
MCHC: 33.8 g/dL (ref 30.0–36.0)
MCV: 90.1 fl (ref 78.0–100.0)
PLATELETS: 263 10*3/uL (ref 150.0–400.0)
RBC: 4.32 Mil/uL (ref 3.87–5.11)
RDW: 13.3 % (ref 11.5–15.5)
WBC: 7.9 10*3/uL (ref 4.0–10.5)

## 2015-10-02 LAB — LDL CHOLESTEROL, DIRECT: Direct LDL: 140 mg/dL

## 2015-10-02 LAB — HEMOGLOBIN A1C: HEMOGLOBIN A1C: 6 % (ref 4.6–6.5)

## 2015-10-02 MED ORDER — TETANUS-DIPHTH-ACELL PERTUSSIS 5-2.5-18.5 LF-MCG/0.5 IM SUSP: 0.5000 mL | Freq: Once | INTRAMUSCULAR | 0 refills | Status: AC

## 2015-10-02 NOTE — Progress Notes (Signed)
Subjective:  Ann Washington is a 65 y.o. year old very pleasant female patient who presents for/with See problem oriented charting ROS- No chest pain or shortness of breath. No headache or blurry vision. .see any ROS included in HPI as well.   Past Medical History-  Patient Active Problem List   Diagnosis Date Noted  . CKD (chronic kidney disease), stage III 03/14/2014    Priority: Medium  . Insomnia 03/08/2014    Priority: Medium  . Hyperglycemia 12/09/2009    Priority: Medium  . Hyperlipidemia 10/13/2006    Priority: Medium  . Depression 10/13/2006    Priority: Medium  . Essential hypertension 10/10/2006    Priority: Medium  . Normocytic anemia 09/12/2014    Priority: Low  . Glaucoma 03/08/2014    Priority: Low  . Stress incontinence, female 10/12/2011    Priority: Low  . Vaginal atrophy 05/27/2011    Priority: Low  . Obesity 10/11/2008    Priority: Low  . GERD 08/25/2007    Priority: Low    Medications- reviewed and updated Current Outpatient Prescriptions  Medication Sig Dispense Refill  . amLODipine-valsartan (EXFORGE) 5-320 MG tablet Take 1 tablet by mouth daily. 90 tablet 3  . Calcium Carbonate-Vit D-Min 600-400 MG-UNIT TABS Take by mouth daily.      Marland Kitchen escitalopram (LEXAPRO) 20 MG tablet TAKE 1/2 TABLET BY MOUTH EVERY DAY 15 tablet 3  . fish oil-omega-3 fatty acids 1000 MG capsule Take 2 g by mouth daily.      . Multiple Vitamin (MULTIVITAMIN) tablet Take 1 tablet by mouth daily.      Marland Kitchen omeprazole (PRILOSEC) 20 MG capsule Take 20 mg by mouth daily.    . pravastatin (PRAVACHOL) 20 MG tablet Take 1 tablet (20 mg total) by mouth daily. 90 tablet 3  . travoprost, benzalkonium, (TRAVATAN) 0.004 % ophthalmic solution Place 1 drop into both eyes at bedtime.      Marland Kitchen zolpidem (AMBIEN) 5 MG tablet Take 1 tablet (5 mg total) by mouth at bedtime as needed. for sleep 30 tablet 5   No current facility-administered medications for this visit.     Objective: BP (!) 146/82    Pulse 63   Temp 97.9 F (36.6 C) (Oral)   Wt 174 lb (78.9 kg)   SpO2 96%   BMI 30.82 kg/m  Gen: NAD, resting comfortably CV: RRR no murmurs rubs or gallops Lungs: CTAB no crackles, wheeze, rhonchi Abdomen: soft/nontender/nondistended/normal bowel sounds. obese  Ext: no edema Skin: warm, dry Neuro: grossly normal, moves all extremities  Assessment/Plan:  Hyperlipidemia S: poorly controlled on fenofibrate with plan to switch from this to pravastatin at last visit. TOday, states she has tolerated pravastatin 20mg  but has had  Some increased muscle soreness- usually able to get in new position.  Lab Results  Component Value Date   CHOL 182 03/13/2015   HDL 61.10 03/13/2015   LDLCALC 107 (H) 03/13/2015   LDLDIRECT 144.5 02/29/2008   TRIG 69.0 03/13/2015   CHOLHDL 3 03/13/2015   A/P: update ldl today- if far under 100- may be able to go down to 10mg  which may reduce diabetes risk as well  Essential hypertension S: controlled on exforge (amlodipine 5mg  and valsartan 320mg ) per [facility] for age but does have CKD III so desire tighter control- better control at home in 130s over 70s BP Readings from Last 3 Encounters:  10/02/15 (!) 146/82  03/27/15 138/82  03/13/15 (!) 150/90  A/P:Continue current meds:  We discussed checking BP  at home at least once a week and if above 140/90 at home or above 150 in office- consider titration of meds up  CKD (chronic kidney disease), stage III S: ckd stable on last check- actually was just above 60 on last 2 checks A/P: update gfr today, avoid nsaids-  May need to tighten BP control if any progression  Hyperglycemia S: has continued3x a week, drinking more water, not tea Wt Readings from Last 3 Encounters:  10/02/15 174 lb (78.9 kg)  03/27/15 178 lb (80.7 kg)  03/13/15 180 lb (81.6 kg)   Lab Results  Component Value Date   HGBA1C 6.1 03/13/2015   A/P: update a1c today- congratulated weight loss efforts- needs to ocntinue- wants to remain off  metformin for now     No Follow-up on file.  No orders of the defined types were placed in this encounter.   No orders of the defined types were placed in this encounter.   Return precautions advised.  Garret Reddish, MD

## 2015-10-02 NOTE — Assessment & Plan Note (Signed)
S: ckd stable on last check- actually was just above 60 on last 2 checks A/P: update gfr today, avoid nsaids-  May need to tighten BP control if any progression

## 2015-10-02 NOTE — Assessment & Plan Note (Signed)
S: poorly controlled on fenofibrate with plan to switch from this to pravastatin at last visit. TOday, states she has tolerated pravastatin 20mg  but has had  Some increased muscle soreness- usually able to get in new position.  Lab Results  Component Value Date   CHOL 182 03/13/2015   HDL 61.10 03/13/2015   LDLCALC 107 (H) 03/13/2015   LDLDIRECT 144.5 02/29/2008   TRIG 69.0 03/13/2015   CHOLHDL 3 03/13/2015   A/P: update ldl today- if far under 100- may be able to go down to 10mg  which may reduce diabetes risk as well

## 2015-10-02 NOTE — Assessment & Plan Note (Signed)
S: has continued3x a week, drinking more water, not tea Wt Readings from Last 3 Encounters:  10/02/15 174 lb (78.9 kg)  03/27/15 178 lb (80.7 kg)  03/13/15 180 lb (81.6 kg)   Lab Results  Component Value Date   HGBA1C 6.1 03/13/2015   A/P: update a1c today- congratulated weight loss efforts- needs to ocntinue- wants to remain off metformin for now

## 2015-10-02 NOTE — Patient Instructions (Addendum)
Call your insurace about the Tetanus shot to see if it is covered or how much it would cost and where is cheaper (here or pharmacy).  If you want to receive here, call for nurse visit. Otherwise- I have printed for you and you can take to pharmacy (another option would be just to take to pharmacy and if reasonable cost- go ahead and get it).   Great job with [employer]- continue the good work! Hopefully this has reduced diabetes risk  Consider cutting pravastatin at future visit depending on lab findings  Really blood pressure goal <140/90 for you with very mild kidney disease. Also avoid medicines like ibuprofen/aleve/motrin- tylenol/acetaminophen is ok. Check at least once a week at home

## 2015-10-02 NOTE — Assessment & Plan Note (Addendum)
S: controlled on exforge (amlodipine 5mg  and valsartan 320mg ) per [facility] for age but does have CKD III so desire tighter control- better control at home in 130s over 70s BP Readings from Last 3 Encounters:  10/02/15 (!) 146/82  03/27/15 138/82  03/13/15 (!) 150/90  A/P:Continue current meds:  We discussed checking BP at home at least once a week and if above 140/90 at home or above 150 in office- consider titration of meds up

## 2015-10-02 NOTE — Progress Notes (Signed)
Pre visit review using our clinic review tool, if applicable. No additional management support is needed unless otherwise documented below in the visit note. 

## 2015-10-16 ENCOUNTER — Ambulatory Visit (INDEPENDENT_AMBULATORY_CARE_PROVIDER_SITE_OTHER): Payer: Medicare Other | Admitting: Family Medicine

## 2015-10-16 ENCOUNTER — Ambulatory Visit
Admission: RE | Admit: 2015-10-16 | Discharge: 2015-10-16 | Disposition: A | Discharge status: Home / Self Care | Payer: Medicare Other | Source: Ambulatory Visit | Attending: Obstetrics and Gynecology | Admitting: Obstetrics and Gynecology

## 2015-10-16 DIAGNOSIS — Z23 Encounter for immunization: Secondary | ICD-10-CM | POA: Diagnosis not present

## 2015-10-16 DIAGNOSIS — Z1231 Encounter for screening mammogram for malignant neoplasm of breast: Secondary | ICD-10-CM | POA: Diagnosis not present

## 2015-10-16 LAB — HM MAMMOGRAPHY

## 2015-10-21 ENCOUNTER — Other Ambulatory Visit: Payer: Self-pay | Admitting: Family Medicine

## 2015-10-22 ENCOUNTER — Ambulatory Visit (INDEPENDENT_AMBULATORY_CARE_PROVIDER_SITE_OTHER): Payer: Medicare Other

## 2015-10-22 ENCOUNTER — Ambulatory Visit (INDEPENDENT_AMBULATORY_CARE_PROVIDER_SITE_OTHER): Payer: Medicare Other | Admitting: Podiatry

## 2015-10-22 DIAGNOSIS — M779 Enthesopathy, unspecified: Secondary | ICD-10-CM | POA: Diagnosis not present

## 2015-10-22 DIAGNOSIS — M65872 Other synovitis and tenosynovitis, left ankle and foot: Secondary | ICD-10-CM | POA: Diagnosis not present

## 2015-10-22 DIAGNOSIS — M7752 Other enthesopathy of left foot: Secondary | ICD-10-CM

## 2015-10-22 DIAGNOSIS — M25572 Pain in left ankle and joints of left foot: Secondary | ICD-10-CM

## 2015-10-22 DIAGNOSIS — R52 Pain, unspecified: Secondary | ICD-10-CM | POA: Diagnosis not present

## 2015-10-22 DIAGNOSIS — M659 Synovitis and tenosynovitis, unspecified: Secondary | ICD-10-CM

## 2015-10-22 MED ORDER — NONFORMULARY OR COMPOUNDED ITEM: 1.0000 g | Freq: Four times a day (QID) | 2 refills | Status: DC

## 2015-10-25 MED ORDER — BETAMETHASONE SOD PHOS & ACET 6 (3-3) MG/ML IJ SUSP: 12.0000 mg | Freq: Once | INTRAMUSCULAR | Status: DC

## 2015-10-25 NOTE — Progress Notes (Signed)
Subjective:  Patient presents today for pain and tenderness to the left ankle which has been ongoing now for for proximal he 6 weeks.. Patient denies trauma.   Patient presents for further treatment and evaluation.    Objective / Physical Exam:  General:  The patient is alert and oriented x3 in no acute distress.  Dermatology:  Skin is warm, dry and supple bilateral lower extremities. Negative for open lesions or macerations.  Vascular:  Palpable pedal pulses bilaterally. No edema or erythema noted. Capillary refill within normal limits.  Neurological:  Epicritic and protective threshold grossly intact bilaterally.   Musculoskeletal Exam:  Pain on palpation to the anterior lateral medial aspects of the patient's left ankle. Mild edema noted.  Range of motion within normal limits to all pedal and ankle joints bilateral. Muscle strength 5/5 in all groups bilateral.   Radiographic Exam:  Normal osseous mineralization. Joint spaces preserved. No fracture/dislocation/boney destruction.    It appears that there is evidence of an old medial malleolar ankle fracture.   Assessment: #1 pain in left ankle #2 synovitis of left ankle #3 capsulitis of left ankle   Plan of Care:  #1 Patient was evaluated. #2 injection of 0.5 mL Celestone Soluspan injected in the patient's left ankle joint. #3 ankle brace dispensed the patient #4 patient is to return to clinic in 4 weeks    Dr. Edrick Kins, [place]

## 2015-10-27 ENCOUNTER — Telehealth: Payer: Self-pay | Admitting: *Deleted

## 2015-10-27 NOTE — Telephone Encounter (Addendum)
Pt states she was to get Meloxicam. I reviewed medication orders and she is to receive [employer] Achilles tendonitis cream. Faxed to [facility]. 10/28/2015-Informed pt Dr. Amalia Hailey notes stated [employer] rx, pt states she thought she was to take both.  I told pt I would ask Dr. Amalia Hailey and call again. Dr. Amalia Hailey ordered Meloxicam. Left message informing pt the rx had been called to the [place].

## 2015-10-28 MED ORDER — MELOXICAM 15 MG PO TABS: 15.0000 mg | ORAL_TABLET | Freq: Every day | ORAL | 0 refills | Status: DC

## 2015-10-28 NOTE — Telephone Encounter (Signed)
Yes please, she is to take both Meloxicam 15mg  and [employer] Cream.   I'm sorry, I just looked at her chart and I must have missed sending in her Rx for meloxicam.  - Dr. Amalia Hailey

## 2015-10-30 DIAGNOSIS — I83813 Varicose veins of bilateral lower extremities with pain: Secondary | ICD-10-CM | POA: Diagnosis not present

## 2015-11-06 DIAGNOSIS — I83813 Varicose veins of bilateral lower extremities with pain: Secondary | ICD-10-CM | POA: Diagnosis not present

## 2015-11-26 ENCOUNTER — Ambulatory Visit: Payer: Medicare Other | Admitting: Podiatry

## 2015-11-27 DIAGNOSIS — I83893 Varicose veins of bilateral lower extremities with other complications: Secondary | ICD-10-CM | POA: Diagnosis not present

## 2015-11-27 DIAGNOSIS — I83813 Varicose veins of bilateral lower extremities with pain: Secondary | ICD-10-CM | POA: Diagnosis not present

## 2015-11-27 DIAGNOSIS — Z23 Encounter for immunization: Secondary | ICD-10-CM | POA: Diagnosis not present

## 2015-12-16 DIAGNOSIS — I83813 Varicose veins of bilateral lower extremities with pain: Secondary | ICD-10-CM | POA: Diagnosis not present

## 2015-12-26 DIAGNOSIS — I8312 Varicose veins of left lower extremity with inflammation: Secondary | ICD-10-CM | POA: Diagnosis not present

## 2015-12-26 DIAGNOSIS — I83892 Varicose veins of left lower extremities with other complications: Secondary | ICD-10-CM | POA: Diagnosis not present

## 2016-01-02 DIAGNOSIS — I83892 Varicose veins of left lower extremities with other complications: Secondary | ICD-10-CM | POA: Diagnosis not present

## 2016-01-02 DIAGNOSIS — I8312 Varicose veins of left lower extremity with inflammation: Secondary | ICD-10-CM | POA: Diagnosis not present

## 2016-01-09 DIAGNOSIS — H401111 Primary open-angle glaucoma, right eye, mild stage: Secondary | ICD-10-CM | POA: Diagnosis not present

## 2016-01-09 DIAGNOSIS — H401122 Primary open-angle glaucoma, left eye, moderate stage: Secondary | ICD-10-CM | POA: Diagnosis not present

## 2016-01-09 DIAGNOSIS — I83892 Varicose veins of left lower extremities with other complications: Secondary | ICD-10-CM | POA: Diagnosis not present

## 2016-01-09 DIAGNOSIS — I8312 Varicose veins of left lower extremity with inflammation: Secondary | ICD-10-CM | POA: Diagnosis not present

## 2016-01-22 DIAGNOSIS — Z1389 Encounter for screening for other disorder: Secondary | ICD-10-CM | POA: Diagnosis not present

## 2016-01-22 DIAGNOSIS — Z01419 Encounter for gynecological examination (general) (routine) without abnormal findings: Secondary | ICD-10-CM | POA: Diagnosis not present

## 2016-01-22 DIAGNOSIS — Z6833 Body mass index (BMI) 33.0-33.9, adult: Secondary | ICD-10-CM | POA: Diagnosis not present

## 2016-01-22 DIAGNOSIS — Z124 Encounter for screening for malignant neoplasm of cervix: Secondary | ICD-10-CM | POA: Diagnosis not present

## 2016-01-22 LAB — HM PAP SMEAR: HM PAP: NEGATIVE

## 2016-02-06 ENCOUNTER — Other Ambulatory Visit: Payer: Self-pay | Admitting: Family Medicine

## 2016-02-09 ENCOUNTER — Telehealth: Payer: Self-pay | Admitting: Family Medicine

## 2016-02-09 NOTE — Telephone Encounter (Signed)
Pt states [facility] has not received the zolpidem (AMBIEN) 5 MG tablet  Printed on Friday 1/12.  Can you resend?  [facility]

## 2016-02-10 NOTE — Telephone Encounter (Signed)
Faxed to pharmacy

## 2016-02-19 DIAGNOSIS — I83892 Varicose veins of left lower extremities with other complications: Secondary | ICD-10-CM | POA: Diagnosis not present

## 2016-02-19 DIAGNOSIS — I8312 Varicose veins of left lower extremity with inflammation: Secondary | ICD-10-CM | POA: Diagnosis not present

## 2016-03-18 DIAGNOSIS — I8312 Varicose veins of left lower extremity with inflammation: Secondary | ICD-10-CM | POA: Diagnosis not present

## 2016-03-18 DIAGNOSIS — M7981 Nontraumatic hematoma of soft tissue: Secondary | ICD-10-CM | POA: Diagnosis not present

## 2016-03-18 DIAGNOSIS — I83812 Varicose veins of left lower extremities with pain: Secondary | ICD-10-CM | POA: Diagnosis not present

## 2016-03-31 NOTE — Progress Notes (Signed)
Subjective:   Ann Washington is a 66 y.o. female who presents for Medicare Annual (Subsequent) preventive examination.  The Patient was informed that the wellness visit is to identify future health risk and educate and initiate measures that can reduce risk for increased disease through the lifespan.    NO ROS; Medicare Wellness Visit Last ov 09/2015 Reva Bores same day  Describes health as good, fair or great? Good   Preventive Screening -Counseling & Management  Colonosocpy; 07/2012 - repeat in 10 years 07/2022  Mammogram 09/2015 - 12/2016 at [facility] Dr. Paula Compton  Dexa 08/2013  -0.1  PAP 2013; had one 2017  Smoking history neg  ETOH - no   Medication adherence or issues?  Pravastatin causing concerns; muscle fatigue   RISK FACTORS Diet;  Eats peanut butter crackers  Left overs;  Gardens in the summer  What are you doing well? 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  Long term goal is to "age in place" or undecided   Mobility of Functional changes this year? No   Mental  Health:  Any emotional problems? Anxious, depressed, irritable, sad or blue? No  Denies feeling depressed or hopeless; voices pleasure in daily life How many social activities have you been engaged in within the last 2 weeks? no Issues x 10 years ago    Activities of Daily Living - See functional screen   Cognitive testing; Ad8 score; 0 or less than 2  MMSE deferred or completed if AD8 + 2 issues  Advanced Directives reviewed the cone form and she declined   Patient Care Team: Marin Olp, MD as PCP - General (Family Medicine) Paula Compton, MD as Consulting Physician (Obstetrics and Gynecology) Rolm Bookbinder, MD as Consulting Physician (Dermatology)  Dr. Aleda Grana; with Ch[address] Apt 3/22  Immunization History  Administered Date(s) Administered  . Influenza,inj,Quad PF,36+ Mos 11/15/2012, 10/30/2015  . Influenza-Unspecified 09/25/2013, 11/10/2014  . Pneumococcal Conjugate-13 03/08/2014  . Pneumococcal Polysaccharide-23 03/13/2015  . Td 01/26/2003  . Tdap 10/16/2015  . Zoster 12/12/2009   Required Immunizations needed today  Screening test up to date or reviewed for plan of completion Health Maintenance Due  Topic Date Due  . INFLUENZA VACCINE  08/26/2015   Has this at medical records office in 10 of 2017   Cardiac Risk Factors include: advanced age (>50men, >41 women);obesity (BMI >30kg/m2);family history of premature cardiovascular disease;hypertension     Objective:     Vitals: BP Marland Kitchen)  148/84   Pulse 84   Ht 5' 2.5" (1.588 m)   Wt 180 lb (81.6 kg)   SpO2 91%   BMI 32.40 kg/m   Body mass index is 32.4 kg/m.  2nd BP 160/ 84 Again, noted sodium and fast food eating.  Tobacco History  Smoking Status  . Never Smoker  Smokeless Tobacco  . Never Used     Counseling given: Yes   Past Medical History:  Diagnosis Date  . CIN I (cervical intraepithelial neoplasia I)    around age 22  . Depression   . 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 GERD (gastroesophageal reflux disease)   . Glaucoma   . Hyperlipidemia   . Hypertension   . Snoring 03/08/2008   Tested 2007 per patient-unrevealing for sleep apnea     Past Surgical History:  Procedure Laterality Date  . COLONOSCOPY    . COLPOSCOPY     age 59  . GYNECOLOGIC CRYOSURGERY     age 62  . HAMMER TOE SURGERY    . TUBAL LIGATION     Family History  Problem Relation Age of Onset  . Diabetes Sister   . Breast cancer Sister     Age 31  . Hypertension Sister   . Alzheimer's disease Mother   . Hip fracture Mother   . Osteoporosis Mother   . Deep vein thrombosis Father   . Heart disease Father     MI 63s, nonsmoker  . Hypertension Brother   . Colon cancer Neg Hx    History  Sexual Activity  . Sexual activity: Yes  . Birth control/ protection: Surgical    Outpatient Encounter Prescriptions as of 04/01/2016  Medication Sig  . amLODipine-valsartan (EXFORGE) 5-320 MG tablet Take 1 tablet by mouth daily.  . Calcium Carbonate-Vit D-Min 600-400 MG-UNIT TABS Take by mouth daily.    Marland Kitchen escitalopram (LEXAPRO) 20 MG tablet TAKE 1/2 TABLET BY MOUTH EVERY DAY  . fish oil-omega-3 fatty acids 1000 MG capsule Take 2 g by mouth daily.    . Multiple Vitamin (MULTIVITAMIN) tablet Take 1 tablet by mouth daily.    Marland Kitchen omeprazole (PRILOSEC) 20 MG capsule Take 20 mg by mouth daily.  . pravastatin (PRAVACHOL) 20 MG tablet Take 1 tablet (20 mg total) by mouth daily.  . travoprost, benzalkonium, (TRAVATAN) 0.004 % ophthalmic solution Place 1 drop into both eyes at bedtime.    Marland Kitchen zolpidem (AMBIEN) 5 MG tablet TAKE 1 TABLET BY MOUTH EVERY DAY AT BEDTIME  . meloxicam (MOBIC) 15 MG tablet Take 1 tablet (15 mg total) by mouth daily. (Patient not taking: Reported on 04/01/2016)  . NONFORMULARY OR COMPOUNDED ITEM Apply 1-2 g topically 4 (four) times daily.   Facility-Administered Encounter Medications as of 04/01/2016  Medication  . betamethasone  acetate-betamethasone sodium phosphate (CELESTONE) injection 12 mg    Activities of Daily Living In your present state of health, do you have any difficulty performing the following activities: 04/01/2016  Hearing? N  Vision? N  Difficulty concentrating or making decisions? N  Walking or climbing stairs? N  Dressing or bathing? N  Doing errands, shopping? N  Preparing Food and eating ? N  Using the Toilet? N  In the past six months, have you accidently leaked urine? N  Do you have problems with loss of bowel control? N  Managing your Medications? N  Managing your Finances? N  Housekeeping or managing your Housekeeping?  N  Some recent data might be hidden    Patient Care Team: Marin Olp, MD as PCP - General (Family Medicine) Paula Compton, MD as Consulting Physician (Obstetrics and Gynecology) Rolm Bookbinder, MD as Consulting Physician (Dermatology)    Assessment:      Exercise Activities and Dietary recommendations Current Exercise Habits: Structured exercise class, Time (Minutes): 30, Frequency (Times/Week): 5, Weekly Exercise (Minutes/Week): 150, Intensity: Moderate  Goals    . Exercise 150 minutes per week (moderate activity)          Will bump up to 5 days at [employer] Will bike or walk in between the arch trainer     . Weight (lb) < 170 lb (77.1 kg)          Check out  online nutrition programs as GumSearch.nl and http://vang.com/;  Calorie king.com  Look for foods with "whole" wheat; bran; oatmeal etc Shot at the farmer's markets in season for fresher choices  Watch for "hydrogenated" on the label of oils which are trans-fats.  Watch for "high fructose corn syrup" in snacks, yogurt or ketchup  Meats have less marbling; bright colored fruits and vegetables;  Canned; dump out liquid and wash vegetables. Be mindful of what we are eating  Portion control is essential to a health weight! Sit down; take a break and enjoy your meal; take smaller bites;  put the fork down between bites;  It takes 20 minutes to get full; so check in with your fullness cues and stop eating when you start to fill full             Fall Risk Fall Risk  04/01/2016 03/13/2015 03/08/2014  Falls in the past year? No No No   Depression Screen PHQ 2/9 Scores 04/01/2016 03/13/2015 03/08/2014  PHQ - 2 Score 0 0 0     Cognitive Function        Immunization History  Administered Date(s) Administered  . Influenza,inj,Quad PF,36+ Mos 11/15/2012, 10/30/2015  . Influenza-Unspecified 09/25/2013, 11/10/2014  . Pneumococcal Conjugate-13 03/08/2014  . Pneumococcal Polysaccharide-23 03/13/2015  . Td 01/26/2003  . Tdap 10/16/2015  . Zoster 12/12/2009   Screening Tests Health Maintenance  Topic Date Due  . INFLUENZA VACCINE  08/26/2015  . MAMMOGRAM  10/15/2017  . COLONOSCOPY  08/10/2022  . TETANUS/TDAP  10/15/2025  . DEXA SCAN  Completed  . Hepatitis C Screening  Completed  . PNA vac Low Risk Adult  Completed      Plan:      PCP Notes  Health Maintenance Documented flu vaccine in Oct 2017  Came this am NPO, so did put labs in as directed by Dr. Yong Channel   Abnormal Screens  A1c pre-diabetic and educated   Referrals none  Patient concerns; Pravastatin is causing her muscles to ache She is still on this med   Nurse Concerns; BP but coming in next week for check Agreed to increase exercise; try to make better food choices   Next PCP apt 04/08/2016   During the course of the visit the patient was educated and counseled about the following appropriate screening and preventive services:   Vaccines to include Pneumoccal, Influenza, Hepatitis B, Td, Zostavax, HCV  Electrocardiogram  Cardiovascular Disease  Colorectal cancer screening   Bone density screening  Diabetes screening  Glaucoma screening  Mammography/PAP  Nutrition counseling   Patient Instructions (the written plan) was given to the patient.   Wynetta Fines,  RN  04/01/2016

## 2016-04-01 ENCOUNTER — Ambulatory Visit (INDEPENDENT_AMBULATORY_CARE_PROVIDER_SITE_OTHER): Payer: Medicare Other

## 2016-04-01 VITALS — BP 148/84 | HR 84 | Ht 62.5 in | Wt 180.0 lb

## 2016-04-01 DIAGNOSIS — E782 Mixed hyperlipidemia: Secondary | ICD-10-CM | POA: Diagnosis not present

## 2016-04-01 DIAGNOSIS — Z0000 Encounter for general adult medical examination without abnormal findings: Secondary | ICD-10-CM | POA: Diagnosis not present

## 2016-04-01 DIAGNOSIS — R739 Hyperglycemia, unspecified: Secondary | ICD-10-CM

## 2016-04-01 LAB — CBC WITH DIFFERENTIAL/PLATELET
BASOS PCT: 0.7 % (ref 0.0–3.0)
Basophils Absolute: 0.1 10*3/uL (ref 0.0–0.1)
EOS PCT: 4.4 % (ref 0.0–5.0)
Eosinophils Absolute: 0.4 10*3/uL (ref 0.0–0.7)
HEMATOCRIT: 40 % (ref 36.0–46.0)
HEMOGLOBIN: 13.2 g/dL (ref 12.0–15.0)
LYMPHS PCT: 31.2 % (ref 12.0–46.0)
Lymphs Abs: 2.6 10*3/uL (ref 0.7–4.0)
MCHC: 33 g/dL (ref 30.0–36.0)
MCV: 91.2 fl (ref 78.0–100.0)
Monocytes Absolute: 0.8 10*3/uL (ref 0.1–1.0)
Monocytes Relative: 9.3 % (ref 3.0–12.0)
Neutro Abs: 4.5 10*3/uL (ref 1.4–7.7)
Neutrophils Relative %: 54.4 % (ref 43.0–77.0)
Platelets: 273 10*3/uL (ref 150.0–400.0)
RBC: 4.39 Mil/uL (ref 3.87–5.11)
RDW: 13.2 % (ref 11.5–15.5)
WBC: 8.3 10*3/uL (ref 4.0–10.5)

## 2016-04-01 LAB — COMPREHENSIVE METABOLIC PANEL
ALT: 16 U/L (ref 0–35)
AST: 19 U/L (ref 0–37)
Albumin: 4.6 g/dL (ref 3.5–5.2)
Alkaline Phosphatase: 60 U/L (ref 39–117)
BUN: 16 mg/dL (ref 6–23)
CALCIUM: 9.9 mg/dL (ref 8.4–10.5)
CO2: 26 meq/L (ref 19–32)
CREATININE: 0.82 mg/dL (ref 0.40–1.20)
Chloride: 105 mEq/L (ref 96–112)
GFR: 73.85 mL/min (ref >60.00)
Glucose, Bld: 99 mg/dL (ref 70–99)
Potassium: 4.1 mEq/L (ref 3.5–5.1)
SODIUM: 139 meq/L (ref 135–145)
Total Bilirubin: 0.7 mg/dL (ref 0.2–1.2)
Total Protein: 7.7 g/dL (ref 6.0–8.3)

## 2016-04-01 LAB — LIPID PANEL
CHOL/HDL RATIO: 4
Cholesterol: 204 mg/dL — ABNORMAL HIGH (ref 0–200)
HDL: 56.7 mg/dL (ref >39.00)
LDL CALC: 126 mg/dL — AB (ref 0–99)
NONHDL: 147.44
Triglycerides: 106 mg/dL (ref 0.0–149.0)
VLDL: 21.2 mg/dL (ref 0.0–40.0)

## 2016-04-01 LAB — HEMOGLOBIN A1C: Hgb A1c MFr Bld: 6.1 % (ref 4.6–6.5)

## 2016-04-01 NOTE — Addendum Note (Signed)
Addended by: Elmer Picker on: 04/01/2016 09:59 AM   Modules accepted: Orders

## 2016-04-01 NOTE — Addendum Note (Signed)
Addended by: Elmer Picker on: 04/01/2016 09:58 AM   Modules accepted: Orders

## 2016-04-01 NOTE — Patient Instructions (Addendum)
[name] , Thank you for taking time to come for your Medicare Wellness Visit. I appreciate your ongoing commitment to your health goals. Please review the following plan we discussed and let me know if I can assist you in the future.   These are the goals we discussed: Goals    . Exercise 150 minutes per week (moderate activity)          Will bump up to 5 days at [employer] Will bike or walk in between the arch trainer     . Weight (lb) < 170 lb (77.1 kg)          Check out  online nutrition programs as GumSearch.nl and http://vang.com/;  Calorie king.com  Look for foods with "whole" wheat; bran; oatmeal etc Shot at the farmer's markets in season for fresher choices  Watch for "hydrogenated" on the label of oils which are trans-fats.  Watch for "high fructose corn syrup" in snacks, yogurt or ketchup  Meats have less marbling; bright colored fruits and vegetables;  Canned; dump out liquid and wash vegetables. Be mindful of what we are eating  Portion control is essential to a health weight! Sit down; take a break and enjoy your meal; take smaller bites; put the fork down between bites;  It takes 20 minutes to get full; so check in with your fullness cues and stop eating when you start to fill full              This is a list of the screening recommended for you and due dates:  Health Maintenance  Topic Date Due  . Flu Shot  08/26/2015  . Mammogram  10/15/2017  . Colon Cancer Screening  08/10/2022  . Tetanus Vaccine  10/15/2025  . DEXA scan (bone density measurement)  Completed  .  Hepatitis C: One time screening is recommended by Center for Disease Control  (CDC) for  adults born from [birth year] through 1967.   Completed  . Pneumonia vaccines  Completed    DASH Eating Plan DASH stands for "Dietary Approaches to Stop Hypertension." The DASH eating plan is a healthy eating plan that has been shown to reduce high blood pressure (hypertension). It may also reduce your  risk for type 2 diabetes, heart disease, and stroke. The DASH eating plan may also help with weight loss. What are tips for following this plan? General guidelines   Avoid eating more than 2,300 mg (milligrams) of salt (sodium) a day. If you have hypertension, you may need to reduce your sodium intake to 1,500 mg a day.  Limit alcohol intake to no more than 1 drink a day for nonpregnant women and 2 drinks a day for men. One drink equals 12 oz of beer, 5 oz of wine, or 1 oz of hard liquor.  Work with your health care provider to maintain a healthy body weight or to lose weight. Ask what an ideal weight is for you.  Get at least 30 minutes of exercise that causes your heart to beat faster (aerobic exercise) most days of the week. Activities may include walking, swimming, or biking.  Work with your health care provider or diet and nutrition specialist (dietitian) to adjust your eating plan to your individual calorie needs. Reading food labels   Check food labels for the amount of sodium per serving. Choose foods with less than 5 percent of the Daily Value of sodium. Generally, foods with less than 300 mg of sodium per serving fit into this  eating plan.  To find whole grains, look for the word "whole" as the first word in the ingredient list. Shopping   Buy products labeled as "low-sodium" or "no salt added."  Buy fresh foods. Avoid canned foods and premade or frozen meals. Cooking   Avoid adding salt when cooking. Use salt-free seasonings or herbs instead of table salt or sea salt. Check with your health care provider or pharmacist before using salt substitutes.  Do not fry foods. Cook foods using healthy methods such as baking, boiling, grilling, and broiling instead.  Cook with heart-healthy oils, such as olive, canola, soybean, or sunflower oil. Meal planning    Eat a balanced diet that includes:  5 or more servings of fruits and vegetables each day. At each meal, try to fill half  of your plate with fruits and vegetables.  Up to 6-8 servings of whole grains each day.  Less than 6 oz of lean meat, poultry, or fish each day. A 3-oz serving of meat is about the same size as a deck of cards. One egg equals 1 oz.  2 servings of low-fat dairy each day.  A serving of nuts, seeds, or beans 5 times each week.  Heart-healthy fats. Healthy fats called Omega-3 fatty acids are found in foods such as flaxseeds and coldwater fish, like sardines, salmon, and mackerel.  Limit how much you eat of the following:  Canned or prepackaged foods.  Food that is high in trans fat, such as fried foods.  Food that is high in saturated fat, such as fatty meat.  Sweets, desserts, sugary drinks, and other foods with added sugar.  Full-fat dairy products.  Do not salt foods before eating.  Try to eat at least 2 vegetarian meals each week.  Eat more home-cooked food and less restaurant, buffet, and fast food.  When eating at a restaurant, ask that your food be prepared with less salt or no salt, if possible. What foods are recommended? The items listed may not be a complete list. Talk with your dietitian about what dietary choices are best for you. Grains  Whole-grain or whole-wheat bread. Whole-grain or whole-wheat pasta. Brown rice. Modena Morrow. Bulgur. Whole-grain and low-sodium cereals. Pita bread. Low-fat, low-sodium crackers. Whole-wheat flour tortillas. Vegetables  Fresh or frozen vegetables (raw, steamed, roasted, or grilled). Low-sodium or reduced-sodium tomato and vegetable juice. Low-sodium or reduced-sodium tomato sauce and tomato paste. Low-sodium or reduced-sodium canned vegetables. Fruits  All fresh, dried, or frozen fruit. Canned fruit in natural juice (without added sugar). Meat and other protein foods  Skinless chicken or [place]. Ground chicken or [place]. Pork with fat trimmed off. Fish and seafood. Egg whites. Dried beans, peas, or lentils. Unsalted nuts, nut  butters, and seeds. Unsalted canned beans. Lean cuts of beef with fat trimmed off. Low-sodium, lean deli meat. Dairy  Low-fat (1%) or fat-free (skim) milk. Fat-free, low-fat, or reduced-fat cheeses. Nonfat, low-sodium ricotta or cottage cheese. Low-fat or nonfat yogurt. Low-fat, low-sodium cheese. Fats and oils  Soft margarine without trans fats. Vegetable oil. Low-fat, reduced-fat, or light mayonnaise and salad dressings (reduced-sodium). Canola, safflower, olive, soybean, and sunflower oils. Avocado. Seasoning and other foods  Herbs. Spices. Seasoning mixes without salt. Unsalted popcorn and pretzels. Fat-free sweets. What foods are not recommended? The items listed may not be a complete list. Talk with your dietitian about what dietary choices are best for you. Grains  Baked goods made with fat, such as croissants, muffins, or some breads. Dry pasta or rice meal  packs. Vegetables  Creamed or fried vegetables. Vegetables in a cheese sauce. Regular canned vegetables (not low-sodium or reduced-sodium). Regular canned tomato sauce and paste (not low-sodium or reduced-sodium). Regular tomato and vegetable juice (not low-sodium or reduced-sodium). Rosita Fire. Olives. Fruits  Canned fruit in a light or heavy syrup. Fried fruit. Fruit in cream or butter sauce. Meat and other protein foods  Fatty cuts of meat. Ribs. Fried meat. Tomasa Blase. Sausage. Bologna and other processed lunch meats. Salami. Fatback. Hotdogs. Bratwurst. Salted nuts and seeds. Canned beans with added salt. Canned or smoked fish. Whole eggs or egg yolks. Chicken or [place] with skin. Dairy  Whole or 2% milk, cream, and half-and-half. Whole or full-fat cream cheese. Whole-fat or sweetened yogurt. Full-fat cheese. Nondairy creamers. Whipped toppings. Processed cheese and cheese spreads. Fats and oils  Butter. Stick margarine. Lard. Shortening. Ghee. Bacon fat. Tropical oils, such as coconut, palm kernel, or palm oil. Seasoning and other foods   Salted popcorn and pretzels. Onion salt, garlic salt, seasoned salt, table salt, and sea salt. Worcestershire sauce. Tartar sauce. Barbecue sauce. Teriyaki sauce. Soy sauce, including reduced-sodium. Steak sauce. Canned and packaged gravies. Fish sauce. Oyster sauce. Cocktail sauce. Horseradish that you find on the shelf. Ketchup. Mustard. Meat flavorings and tenderizers. Bouillon cubes. Hot sauce and Tabasco sauce. Premade or packaged marinades. Premade or packaged taco seasonings. Relishes. Regular salad dressings. Where to find more information:  National Heart, Lung, and Blood Institute: PopSteam.is  American Heart Association: www.heart.org Summary  The DASH eating plan is a healthy eating plan that has been shown to reduce high blood pressure (hypertension). It may also reduce your risk for type 2 diabetes, heart disease, and stroke.  With the DASH eating plan, you should limit salt (sodium) intake to 2,300 mg a day. If you have hypertension, you may need to reduce your sodium intake to 1,500 mg a day.  When on the DASH eating plan, aim to eat more fresh fruits and vegetables, whole grains, lean proteins, low-fat dairy, and heart-healthy fats.  Work with your health care provider or diet and nutrition specialist (dietitian) to adjust your eating plan to your individual calorie needs. This information is not intended to replace advice given to you by your health care provider. Make sure you discuss any questions you have with your health care provider. Document Released: 12/31/2010 Document Revised: 01/05/2016 Document Reviewed: 01/05/2016 Elsevier Interactive Patient Education  2017 Elsevier Inc.      Fat and Cholesterol Restricted Diet Getting too much fat and cholesterol in your diet may cause health problems. Following this diet helps keep your fat and cholesterol at normal levels. This can keep you from getting sick. What types of fat should I choose?  Choose  monosaturated and polyunsaturated fats. These are found in foods such as olive oil, canola oil, flaxseeds, walnuts, almonds, and seeds.  Eat more omega-3 fats. Good choices include salmon, mackerel, sardines, tuna, flaxseed oil, and ground flaxseeds.  Limit saturated fats. These are in animal products such as meats, butter, and cream. They can also be in plant products such as palm oil, palm kernel oil, and coconut oil.  Avoid foods with partially hydrogenated oils in them. These contain trans fats. Examples of foods that have trans fats are stick margarine, some tub margarines, cookies, crackers, and other baked goods. What general guidelines do I need to follow?  Check food labels. Look for the words "trans fat" and "saturated fat."  When preparing a meal:  Fill half of your plate  with vegetables and green salads.  Fill one fourth of your plate with whole grains. Look for the word "whole" as the first word in the ingredient list.  Fill one fourth of your plate with lean protein foods.  Eat more foods that have fiber, like apples, carrots, beans, peas, and barley.  Eat more home-cooked foods. Eat less at restaurants and buffets.  Limit or avoid alcohol.  Limit foods high in starch and sugar.  Limit fried foods.  Cook foods without frying them. Baking, boiling, grilling, and broiling are all great options.  Lose weight if you are overweight. Losing even a small amount of weight can help your overall health. It can also help prevent diseases such as diabetes and heart disease. What foods can I eat? Grains  Whole grains, such as whole wheat or whole grain breads, crackers, cereals, and pasta. Unsweetened oatmeal, bulgur, barley, quinoa, or brown rice. Corn or whole wheat flour tortillas. Vegetables  Fresh or frozen vegetables (raw, steamed, roasted, or grilled). Green salads. Fruits  All fresh, canned (in natural juice), or frozen fruits. Meat and Other Protein Products  Ground  beef (85% or leaner), grass-fed beef, or beef trimmed of fat. Skinless chicken or [place]. Ground chicken or [place]. Pork trimmed of fat. All fish and seafood. Eggs. Dried beans, peas, or lentils. Unsalted nuts or seeds. Unsalted canned or dry beans. Dairy  Low-fat dairy products, such as skim or 1% milk, 2% or reduced-fat cheeses, low-fat ricotta or cottage cheese, or plain low-fat yogurt. Fats and Oils  Tub margarines without trans fats. Light or reduced-fat mayonnaise and salad dressings. Avocado. Olive, canola, sesame, or safflower oils. Natural peanut or almond butter (choose ones without added sugar and oil). The items listed above may not be a complete list of recommended foods or beverages. Contact your dietitian for more options.  What foods are not recommended? Grains  White bread. White pasta. White rice. Cornbread. Bagels, pastries, and croissants. Crackers that contain trans fat. Vegetables  White potatoes. Corn. Creamed or fried vegetables. Vegetables in a cheese sauce. Fruits  Dried fruits. Canned fruit in light or heavy syrup. Fruit juice. Meat and Other Protein Products  Fatty cuts of meat. Ribs, chicken wings, bacon, sausage, bologna, salami, chitterlings, fatback, hot dogs, bratwurst, and packaged luncheon meats. Liver and organ meats. Dairy  Whole or 2% milk, cream, half-and-half, and cream cheese. Whole milk cheeses. Whole-fat or sweetened yogurt. Full-fat cheeses. Nondairy creamers and whipped toppings. Processed cheese, cheese spreads, or cheese curds. Sweets and Desserts  Corn syrup, sugars, honey, and molasses. Candy. Jam and jelly. Syrup. Sweetened cereals. Cookies, pies, cakes, donuts, muffins, and ice cream. Fats and Oils  Butter, stick margarine, lard, shortening, ghee, or bacon fat. Coconut, palm kernel, or palm oils. Beverages  Alcohol. Sweetened drinks (such as sodas, lemonade, and fruit drinks or punches). The items listed above may not be a complete list of  foods and beverages to avoid. Contact your dietitian for more information.  This information is not intended to replace advice given to you by your health care provider. Make sure you discuss any questions you have with your health care provider. Document Released: 07/13/2011 Document Revised: 09/18/2015 Document Reviewed: 04/12/2013 Elsevier Interactive Patient Education  2017 [employer].   Fall Prevention in the Home Falls can cause injuries. They can happen to people of all ages. There are many things you can do to make your home safe and to help prevent falls. What can I do on the outside  of my home?  Regularly fix the edges of walkways and driveways and fix any cracks.  Remove anything that might make you trip as you walk through a door, such as a raised step or threshold.  Trim any bushes or trees on the path to your home.  Use bright outdoor lighting.  Clear any walking paths of anything that might make someone trip, such as rocks or tools.  Regularly check to see if handrails are loose or broken. Make sure that both sides of any steps have handrails.  Any raised decks and porches should have guardrails on the edges.  Have any leaves, snow, or ice cleared regularly.  Use sand or salt on walking paths during winter.  Clean up any spills in your garage right away. This includes oil or grease spills. What can I do in the bathroom?  Use night lights.  Install grab bars by the toilet and in the tub and shower. Do not use towel bars as grab bars.  Use non-skid mats or decals in the tub or shower.  If you need to sit down in the shower, use a plastic, non-slip stool.  Keep the floor dry. Clean up any water that spills on the floor as soon as it happens.  Remove soap buildup in the tub or shower regularly.  Attach bath mats securely with double-sided non-slip rug tape.  Do not have throw rugs and other things on the floor that can make you trip. What can I do in the  bedroom?  Use night lights.  Make sure that you have a light by your bed that is easy to reach.  Do not use any sheets or blankets that are too big for your bed. They should not hang down onto the floor.  Have a firm chair that has side arms. You can use this for support while you get dressed.  Do not have throw rugs and other things on the floor that can make you trip. What can I do in the kitchen?  Clean up any spills right away.  Avoid walking on wet floors.  Keep items that you use a lot in easy-to-reach places.  If you need to reach something above you, use a strong step stool that has a grab bar.  Keep electrical cords out of the way.  Do not use floor polish or wax that makes floors slippery. If you must use wax, use non-skid floor wax.  Do not have throw rugs and other things on the floor that can make you trip. What can I do with my stairs?  Do not leave any items on the stairs.  Make sure that there are handrails on both sides of the stairs and use them. Fix handrails that are broken or loose. Make sure that handrails are as long as the stairways.  Check any carpeting to make sure that it is firmly attached to the stairs. Fix any carpet that is loose or worn.  Avoid having throw rugs at the top or bottom of the stairs. If you do have throw rugs, attach them to the floor with carpet tape.  Make sure that you have a light switch at the top of the stairs and the bottom of the stairs. If you do not have them, ask someone to add them for you. What else can I do to help prevent falls?  Wear shoes that:  Do not have high heels.  Have rubber bottoms.  Are comfortable and fit you well.  Are closed at the toe. Do not wear sandals.  If you use a stepladder:  Make sure that it is fully opened. Do not climb a closed stepladder.  Make sure that both sides of the stepladder are locked into place.  Ask someone to hold it for you, if possible.  Clearly mark and make  sure that you can see:  Any grab bars or handrails.  First and last steps.  Where the edge of each step is.  Use tools that help you move around (mobility aids) if they are needed. These include:  Canes.  Walkers.  Scooters.  Crutches.  Turn on the lights when you go into a dark area. Replace any light bulbs as soon as they burn out.  Set up your furniture so you have a clear path. Avoid moving your furniture around.  If any of your floors are uneven, fix them.  If there are any pets around you, be aware of where they are.  Review your medicines with your doctor. Some medicines can make you feel dizzy. This can increase your chance of falling. Ask your doctor what other things that you can do to help prevent falls. This information is not intended to replace advice given to you by your health care provider. Make sure you discuss any questions you have with your health care provider. Document Released: 11/07/2008 Document Revised: 06/19/2015 Document Reviewed: 02/15/2014 Elsevier Interactive Patient Education  2017 [place] Maintenance, Female Adopting a healthy lifestyle and getting preventive care can go a long way to promote health and wellness. Talk with your health care provider about what schedule of regular examinations is right for you. This is a good chance for you to check in with your provider about disease prevention and staying healthy. In between checkups, there are plenty of things you can do on your own. Experts have done a lot of research about which lifestyle changes and preventive measures are most likely to keep you healthy. Ask your health care provider for more information. Weight and diet Eat a healthy diet  Be sure to include plenty of vegetables, fruits, low-fat dairy products, and lean protein.  Do not eat a lot of foods high in solid fats, added sugars, or salt.  Get regular exercise. This is one of the most important things you can do  for your health.  Most adults should exercise for at least 150 minutes each week. The exercise should increase your heart rate and make you sweat (moderate-intensity exercise).  Most adults should also do strengthening exercises at least twice a week. This is in addition to the moderate-intensity exercise. Maintain a healthy weight  Body mass index (BMI) is a measurement that can be used to identify possible weight problems. It estimates body fat based on height and weight. Your health care provider can help determine your BMI and help you achieve or maintain a healthy weight.  For females 89 years of age and older:  A BMI below 18.5 is considered underweight.  A BMI of 18.5 to 24.9 is normal.  A BMI of 25 to 29.9 is considered overweight.  A BMI of 30 and above is considered obese. Watch levels of cholesterol and blood lipids  You should start having your blood tested for lipids and cholesterol at 68 years of age, then have this test every 5 years.  You may need to have your cholesterol levels checked more often if:  Your lipid or cholesterol levels are high.  You  are older than 68 years of age.  You are at high risk for heart disease. Cancer screening Lung Cancer  Lung cancer screening is recommended for adults 10-54 years old who are at high risk for lung cancer because of a history of smoking.  A yearly low-dose CT scan of the lungs is recommended for people who:  Currently smoke.  Have quit within the past 15 years.  Have at least a 30-pack-year history of smoking. A pack year is smoking an average of one pack of cigarettes a day for 1 year.  Yearly screening should continue until it has been 15 years since you quit.  Yearly screening should stop if you develop a health problem that would prevent you from having lung cancer treatment. Breast Cancer  Practice breast self-awareness. This means understanding how your breasts normally appear and feel.  It also means  doing regular breast self-exams. Let your health care provider know about any changes, no matter how small.  If you are in your 20s or 30s, you should have a clinical breast exam (CBE) by a health care provider every 1-3 years as part of a regular health exam.  If you are 79 or older, have a CBE every year. Also consider having a breast X-ray (mammogram) every year.  If you have a family history of breast cancer, talk to your health care provider about genetic screening.  If you are at high risk for breast cancer, talk to your health care provider about having an MRI and a mammogram every year.  Breast cancer gene (BRCA) assessment is recommended for women who have family members with BRCA-related cancers. BRCA-related cancers include:  Breast.  Ovarian.  Tubal.  Peritoneal cancers.  Results of the assessment will determine the need for genetic counseling and BRCA1 and BRCA2 testing. Cervical Cancer  Your health care provider may recommend that you be screened regularly for cancer of the pelvic organs (ovaries, uterus, and vagina). This screening involves a pelvic examination, including checking for microscopic changes to the surface of your cervix (Pap test). You may be encouraged to have this screening done every 3 years, beginning at age 42.  For women ages 27-65, health care providers may recommend pelvic exams and Pap testing every 3 years, or they may recommend the Pap and pelvic exam, combined with testing for human papilloma virus (HPV), every 5 years. Some types of HPV increase your risk of cervical cancer. Testing for HPV may also be done on women of any age with unclear Pap test results.  Other health care providers may not recommend any screening for nonpregnant women who are considered low risk for pelvic cancer and who do not have symptoms. Ask your health care provider if a screening pelvic exam is right for you.  If you have had past treatment for cervical cancer or a  condition that could lead to cancer, you need Pap tests and screening for cancer for at least 20 years after your treatment. If Pap tests have been discontinued, your risk factors (such as having a new sexual partner) need to be reassessed to determine if screening should resume. Some women have medical problems that increase the chance of getting cervical cancer. In these cases, your health care provider may recommend more frequent screening and Pap tests. Colorectal Cancer  This type of cancer can be detected and often prevented.  Routine colorectal cancer screening usually begins at 68 years of age and continues through 68 years of age.  Your health  care provider may recommend screening at an earlier age if you have risk factors for colon cancer.  Your health care provider may also recommend using home test kits to check for hidden blood in the stool.  A small camera at the end of a tube can be used to examine your colon directly (sigmoidoscopy or colonoscopy). This is done to check for the earliest forms of colorectal cancer.  Routine screening usually begins at age 22.  Direct examination of the colon should be repeated every 5-10 years through 68 years of age. However, you may need to be screened more often if early forms of precancerous polyps or small growths are found. Skin Cancer  Check your skin from head to toe regularly.  Tell your health care provider about any new moles or changes in moles, especially if there is a change in a mole's shape or color.  Also tell your health care provider if you have a mole that is larger than the size of a pencil eraser.  Always use sunscreen. Apply sunscreen liberally and repeatedly throughout the day.  Protect yourself by wearing long sleeves, pants, a wide-brimmed hat, and sunglasses whenever you are outside. Heart disease, diabetes, and high blood pressure  High blood pressure causes heart disease and increases the risk of stroke. High  blood pressure is more likely to develop in:  People who have blood pressure in the high end of the normal range (130-139/85-89 mm Hg).  People who are overweight or obese.  People who are African American.  If you are 63-56 years of age, have your blood pressure checked every 3-5 years. If you are 72 years of age or older, have your blood pressure checked every year. You should have your blood pressure measured twice-once when you are at a hospital or clinic, and once when you are not at a hospital or clinic. Record the average of the two measurements. To check your blood pressure when you are not at a hospital or clinic, you can use:  An automated blood pressure machine at a pharmacy.  A home blood pressure monitor.  If you are between 37 years and 42 years old, ask your health care provider if you should take aspirin to prevent strokes.  Have regular diabetes screenings. This involves taking a blood sample to check your fasting blood sugar level.  If you are at a normal weight and have a low risk for diabetes, have this test once every three years after 68 years of age.  If you are overweight and have a high risk for diabetes, consider being tested at a younger age or more often. Preventing infection Hepatitis B  If you have a higher risk for hepatitis B, you should be screened for this virus. You are considered at high risk for hepatitis B if:  You were born in a country where hepatitis B is common. Ask your health care provider which countries are considered high risk.  Your parents were born in a high-risk country, and you have not been immunized against hepatitis B (hepatitis B vaccine).  You have HIV or AIDS.  You use needles to inject street drugs.  You live with someone who has hepatitis B.  You have had sex with someone who has hepatitis B.  You get hemodialysis treatment.  You take certain medicines for conditions, including cancer, organ transplantation, and  autoimmune conditions. Hepatitis C  Blood testing is recommended for:  Everyone born from [birth year] through 1967.  Anyone with known  risk factors for hepatitis C. Sexually transmitted infections (STIs)  You should be screened for sexually transmitted infections (STIs) including gonorrhea and chlamydia if:  You are sexually active and are younger than 68 years of age.  You are older than 68 years of age and your health care provider tells you that you are at risk for this type of infection.  Your sexual activity has changed since you were last screened and you are at an increased risk for chlamydia or gonorrhea. Ask your health care provider if you are at risk.  If you do not have HIV, but are at risk, it may be recommended that you take a prescription medicine daily to prevent HIV infection. This is called pre-exposure prophylaxis (PrEP). You are considered at risk if:  You are sexually active and do not regularly use condoms or know the HIV status of your partner(s).  You take drugs by injection.  You are sexually active with a partner who has HIV. Talk with your health care provider about whether you are at high risk of being infected with HIV. If you choose to begin PrEP, you should first be tested for HIV. You should then be tested every 3 months for as long as you are taking PrEP. Pregnancy  If you are premenopausal and you may become pregnant, ask your health care provider about preconception counseling.  If you may become pregnant, take 400 to 800 micrograms (mcg) of folic acid every day.  If you want to prevent pregnancy, talk to your health care provider about birth control (contraception). Osteoporosis and menopause  Osteoporosis is a disease in which the bones lose minerals and strength with aging. This can result in serious bone fractures. Your risk for osteoporosis can be identified using a bone density scan.  If you are 12 years of age or older, or if you are at risk for  osteoporosis and fractures, ask your health care provider if you should be screened.  Ask your health care provider whether you should take a calcium or vitamin D supplement to lower your risk for osteoporosis.  Menopause may have certain physical symptoms and risks.  Hormone replacement therapy may reduce some of these symptoms and risks. Talk to your health care provider about whether hormone replacement therapy is right for you. Follow these instructions at home:  Schedule regular health, dental, and eye exams.  Stay current with your immunizations.  Do not use any tobacco products including cigarettes, chewing tobacco, or electronic cigarettes.  If you are pregnant, do not drink alcohol.  If you are breastfeeding, limit how much and how often you drink alcohol.  Limit alcohol intake to no more than 1 drink per day for nonpregnant women. One drink equals 12 ounces of beer, 5 ounces of wine, or 1 ounces of hard liquor.  Do not use street drugs.  Do not share needles.  Ask your health care provider for help if you need support or information about quitting drugs.  Tell your health care provider if you often feel depressed.  Tell your health care provider if you have ever been abused or do not feel safe at home. This information is not intended to replace advice given to you by your health care provider. Make sure you discuss any questions you have with your health care provider. Document Released: 07/27/2010 Document Revised: 06/19/2015 Document Reviewed: 10/15/2014 Elsevier Interactive Patient Education  2017 [employer].

## 2016-04-01 NOTE — Progress Notes (Signed)
I have reviewed and agree with note, evaluation, plan.   Lab Results  Component Value Date   HGBA1C 6.1 04/01/2016  needs weight loss- has visit scheduled with me within a week but apparently may need to reschedule  Garret Reddish, MD

## 2016-04-08 ENCOUNTER — Ambulatory Visit (INDEPENDENT_AMBULATORY_CARE_PROVIDER_SITE_OTHER): Payer: Medicare Other | Admitting: Family Medicine

## 2016-04-08 ENCOUNTER — Encounter: Payer: Self-pay | Admitting: Family Medicine

## 2016-04-08 DIAGNOSIS — E782 Mixed hyperlipidemia: Secondary | ICD-10-CM | POA: Diagnosis not present

## 2016-04-08 DIAGNOSIS — R739 Hyperglycemia, unspecified: Secondary | ICD-10-CM | POA: Diagnosis not present

## 2016-04-08 DIAGNOSIS — I10 Essential (primary) hypertension: Secondary | ICD-10-CM | POA: Diagnosis not present

## 2016-04-08 MED ORDER — AMLODIPINE BESYLATE-VALSARTAN 5-320 MG PO TABS: 1.0000 | ORAL_TABLET | Freq: Every day | ORAL | 3 refills | Status: DC

## 2016-04-08 NOTE — Progress Notes (Signed)
Pre visit review using our clinic review tool, if applicable. No additional management support is needed unless otherwise documented below in the visit note. 

## 2016-04-08 NOTE — Assessment & Plan Note (Signed)
S: a1c has ranged from 6-6.3 over last 2 years.  Lab Results  Component Value Date   HGBA1C 6.1 04/01/2016  A/P: Encouraged need for healthy eating, regular exercise, weight loss.  10-20 lb goal in 6 months. Update a1c in 6 months

## 2016-04-08 NOTE — Assessment & Plan Note (Signed)
S: well controlled on pravastatin but does have myalgias. Has been eating out more- vacation around October and then the holidays. Does pb crackers for breakfast. Lab Results  Component Value Date   CHOL 204 (H) 04/01/2016   HDL 56.70 04/01/2016   LDLCALC 126 (H) 04/01/2016   LDLDIRECT 140.0 10/02/2015   TRIG 106.0 04/01/2016   CHOLHDL 4 04/01/2016   A/P: cholesterol has gone up despite pravastatin- likely weight related. With myalgias- she wants to trial off statin. We will recheck in 6 months and focus on 10-20 lbs weight loss in this time- to try dash eating plan and myfitness pal

## 2016-04-08 NOTE — Assessment & Plan Note (Signed)
S: controlled on exforge (amlodipine 5mg  and valsartan 320mg ).  BP Readings from Last 3 Encounters:  04/08/16 132/76  04/01/16 (!) 148/84  10/02/15 (!) 146/82  A/P:Continue current medications

## 2016-04-08 NOTE — Progress Notes (Signed)
Subjective:  Ann Washington is a 66 y.o. year old very pleasant female patient who presents for/with See problem oriented charting ROS- admits to myalgias. No chest pain or shortness of breath. No headache or blurry vision.  Feels mild moisture in ears at times and cleans out  Past Medical History-  Patient Active Problem List   Diagnosis Date Noted  . CKD (chronic kidney disease), stage III 03/14/2014    Priority: Medium  . Insomnia 03/08/2014    Priority: Medium  . Hyperglycemia 12/09/2009    Priority: Medium  . Hyperlipidemia 10/13/2006    Priority: Medium  . Depression 10/13/2006    Priority: Medium  . Essential hypertension 10/10/2006    Priority: Medium  . Normocytic anemia 09/12/2014    Priority: Low  . Glaucoma 03/08/2014    Priority: Low  . Stress incontinence, female 10/12/2011    Priority: Low  . Vaginal atrophy 05/27/2011    Priority: Low  . Obesity 10/11/2008    Priority: Low  . GERD 08/25/2007    Priority: Low    Medications- reviewed and updated Current Outpatient Prescriptions  Medication Sig Dispense Refill  . amLODipine-valsartan (EXFORGE) 5-320 MG tablet Take 1 tablet by mouth daily. 90 tablet 3  . Calcium Carbonate-Vit D-Min 600-400 MG-UNIT TABS Take by mouth daily.      Marland Kitchen escitalopram (LEXAPRO) 20 MG tablet TAKE 1/2 TABLET BY MOUTH EVERY DAY 15 tablet 5  . fish oil-omega-3 fatty acids 1000 MG capsule Take 2 g by mouth daily.      . meloxicam (MOBIC) 15 MG tablet Take 1 tablet (15 mg total) by mouth daily. 30 tablet 0  . Multiple Vitamin (MULTIVITAMIN) tablet Take 1 tablet by mouth daily.      . NONFORMULARY OR COMPOUNDED ITEM Apply 1-2 g topically 4 (four) times daily. 120 each 2  . omeprazole (PRILOSEC) 20 MG capsule Take 20 mg by mouth daily.    . travoprost, benzalkonium, (TRAVATAN) 0.004 % ophthalmic solution Place 1 drop into both eyes at bedtime.      Marland Kitchen zolpidem (AMBIEN) 5 MG tablet TAKE 1 TABLET BY MOUTH EVERY DAY AT BEDTIME 30 tablet 5    Current Facility-Administered Medications  Medication Dose Route Frequency Provider Last Rate Last Dose  . betamethasone acetate-betamethasone sodium phosphate (CELESTONE) injection 12 mg  12 mg Intramuscular Once Edrick Kins, DPM        Objective: BP 132/76 (BP Location: Left Arm, Patient Position: Sitting, Cuff Size: Normal)   Pulse 72   Temp 98.4 F (36.9 C) (Oral)   Ht 5' 2.5" (1.588 m)   Wt 181 lb 3.2 oz (82.2 kg)   SpO2 92%   BMI 32.61 kg/m  Gen: NAD, resting comfortably TM normal bilaterally  CV: RRR no murmurs rubs or gallops Lungs: CTAB no crackles, wheeze, rhonchi Ext: no edema Skin: warm, dry, no rash  Assessment/Plan:  Essential hypertension S: controlled on exforge (amlodipine 5mg  and valsartan 320mg ).  BP Readings from Last 3 Encounters:  04/08/16 132/76  04/01/16 (!) 148/84  10/02/15 (!) 146/82  A/P:Continue current medications  Hyperlipidemia S: well controlled on pravastatin but does have myalgias. Has been eating out more- vacation around October and then the holidays. Does pb crackers for breakfast. Lab Results  Component Value Date   CHOL 204 (H) 04/01/2016   HDL 56.70 04/01/2016   LDLCALC 126 (H) 04/01/2016   LDLDIRECT 140.0 10/02/2015   TRIG 106.0 04/01/2016   CHOLHDL 4 04/01/2016   A/P: cholesterol has  gone up despite pravastatin- likely weight related. With myalgias- she wants to trial off statin. We will recheck in 6 months and focus on 10-20 lbs weight loss in this time- to try dash eating plan and myfitness pal  Hyperglycemia S: a1c has ranged from 6-6.3 over last 2 years.  Lab Results  Component Value Date   HGBA1C 6.1 04/01/2016  A/P: Encouraged need for healthy eating, regular exercise, weight loss.  10-20 lb goal in 6 months. Update a1c in 6 months   Return in about 6 months (around 10/09/2016). Labs a few days prior Orders Placed This Encounter  Procedures  . Cholesterol, Total    Standing Status:   Future    Standing  Expiration Date:   04/08/2017  . LDL cholesterol, direct    [place]    Standing Status:   Future    Standing Expiration Date:   04/08/2017  . HDL cholesterol    Standing Status:   Future    Standing Expiration Date:   04/08/2017  . Hemoglobin A1c    [place]    Standing Status:   Future    Standing Expiration Date:   04/08/2017    Meds ordered this encounter  Medications  . amLODipine-valsartan (EXFORGE) 5-320 MG tablet    Sig: Take 1 tablet by mouth daily.    Dispense:  90 tablet    Refill:  3    Return precautions advised.  Garret Reddish, MD

## 2016-04-08 NOTE — Patient Instructions (Addendum)
Lets trial dash eating plan  Check out  online nutrition programs as GumSearch.nl and http://vang.com/; Calorie king.com  Schedule a lab visit at the check out desk for a few days before your 6 month visit. Return for future fasting labs meaning nothing but water after midnight please. Ok to take your medications with water.    DASH Eating Plan DASH stands for "Dietary Approaches to Stop Hypertension." The DASH eating plan is a healthy eating plan that has been shown to reduce high blood pressure (hypertension). It may also reduce your risk for type 2 diabetes, heart disease, and stroke. The DASH eating plan may also help with weight loss. What are tips for following this plan? General guidelines   Avoid eating more than 2,300 mg (milligrams) of salt (sodium) a day. If you have hypertension, you may need to reduce your sodium intake to 1,500 mg a day.  Limit alcohol intake to no more than 1 drink a day for nonpregnant women and 2 drinks a day for men. One drink equals 12 oz of beer, 5 oz of wine, or 1 oz of hard liquor.  Work with your health care provider to maintain a healthy body weight or to lose weight. Ask what an ideal weight is for you.  Get at least 30 minutes of exercise that causes your heart to beat faster (aerobic exercise) most days of the week. Activities may include walking, swimming, or biking.  Work with your health care provider or diet and nutrition specialist (dietitian) to adjust your eating plan to your individual calorie needs. Reading food labels   Check food labels for the amount of sodium per serving. Choose foods with less than 5 percent of the Daily Value of sodium. Generally, foods with less than 300 mg of sodium per serving fit into this eating plan.  To find whole grains, look for the word "whole" as the first word in the ingredient list. Shopping   Buy products labeled as "low-sodium" or "no salt added."  Buy fresh foods. Avoid canned foods and  premade or frozen meals. Cooking   Avoid adding salt when cooking. Use salt-free seasonings or herbs instead of table salt or sea salt. Check with your health care provider or pharmacist before using salt substitutes.  Do not fry foods. Cook foods using healthy methods such as baking, boiling, grilling, and broiling instead.  Cook with heart-healthy oils, such as olive, canola, soybean, or sunflower oil. Meal planning    Eat a balanced diet that includes:  5 or more servings of fruits and vegetables each day. At each meal, try to fill half of your plate with fruits and vegetables.  Up to 6-8 servings of whole grains each day.  Less than 6 oz of lean meat, poultry, or fish each day. A 3-oz serving of meat is about the same size as a deck of cards. One egg equals 1 oz.  2 servings of low-fat dairy each day.  A serving of nuts, seeds, or beans 5 times each week.  Heart-healthy fats. Healthy fats called Omega-3 fatty acids are found in foods such as flaxseeds and coldwater fish, like sardines, salmon, and mackerel.  Limit how much you eat of the following:  Canned or prepackaged foods.  Food that is high in trans fat, such as fried foods.  Food that is high in saturated fat, such as fatty meat.  Sweets, desserts, sugary drinks, and other foods with added sugar.  Full-fat dairy products.  Do not salt foods  before eating.  Try to eat at least 2 vegetarian meals each week.  Eat more home-cooked food and less restaurant, buffet, and fast food.  When eating at a restaurant, ask that your food be prepared with less salt or no salt, if possible. What foods are recommended? The items listed may not be a complete list. Talk with your dietitian about what dietary choices are best for you. Grains  Whole-grain or whole-wheat bread. Whole-grain or whole-wheat pasta. Brown rice. [place]. Bulgur. Whole-grain and low-sodium cereals. Pita bread. Low-fat, low-sodium crackers.  Whole-wheat flour tortillas. Vegetables  Fresh or frozen vegetables (raw, steamed, roasted, or grilled). Low-sodium or reduced-sodium tomato and vegetable juice. Low-sodium or reduced-sodium tomato sauce and tomato paste. Low-sodium or reduced-sodium canned vegetables. Fruits  All fresh, dried, or frozen fruit. Canned fruit in natural juice (without added sugar). Meat and other protein foods  Skinless chicken or [place]. Ground chicken or [place]. Pork with fat trimmed off. Fish and seafood. Egg whites. Dried beans, peas, or lentils. Unsalted nuts, nut butters, and seeds. Unsalted canned beans. Lean cuts of beef with fat trimmed off. Low-sodium, lean deli meat. Dairy  Low-fat (1%) or fat-free (skim) milk. Fat-free, low-fat, or reduced-fat cheeses. Nonfat, low-sodium ricotta or cottage cheese. Low-fat or nonfat yogurt. Low-fat, low-sodium cheese. Fats and oils  Soft margarine without trans fats. Vegetable oil. Low-fat, reduced-fat, or light mayonnaise and salad dressings (reduced-sodium). Canola, safflower, olive, soybean, and sunflower oils. Avocado. Seasoning and other foods  Herbs. Spices. Seasoning mixes without salt. Unsalted popcorn and pretzels. Fat-free sweets. What foods are not recommended? The items listed may not be a complete list. Talk with your dietitian about what dietary choices are best for you. Grains  Baked goods made with fat, such as croissants, muffins, or some breads. Dry pasta or rice meal packs. Vegetables  Creamed or fried vegetables. Vegetables in a cheese sauce. Regular canned vegetables (not low-sodium or reduced-sodium). Regular canned tomato sauce and paste (not low-sodium or reduced-sodium). Regular tomato and vegetable juice (not low-sodium or reduced-sodium). Angie Fava. Olives. Fruits  Canned fruit in a light or heavy syrup. Fried fruit. Fruit in cream or butter sauce. Meat and other protein foods  Fatty cuts of meat. Ribs. Fried meat. Berniece Salines. Sausage. Bologna and  other processed lunch meats. Salami. Fatback. Hotdogs. Bratwurst. Salted nuts and seeds. Canned beans with added salt. Canned or smoked fish. Whole eggs or egg yolks. Chicken or [place] with skin. Dairy  Whole or 2% milk, cream, and half-and-half. Whole or full-fat cream cheese. Whole-fat or sweetened yogurt. Full-fat cheese. Nondairy creamers. Whipped toppings. Processed cheese and cheese spreads. Fats and oils  Butter. Stick margarine. Lard. Shortening. Ghee. Bacon fat. Tropical oils, such as coconut, palm kernel, or palm oil. Seasoning and other foods  Salted popcorn and pretzels. Onion salt, garlic salt, seasoned salt, table salt, and sea salt. Worcestershire sauce. Tartar sauce. Barbecue sauce. Teriyaki sauce. Soy sauce, including reduced-sodium. Steak sauce. Canned and packaged gravies. Fish sauce. Oyster sauce. Cocktail sauce. Horseradish that you find on the shelf. Ketchup. Mustard. Meat flavorings and tenderizers. Bouillon cubes. Hot sauce and Tabasco sauce. Premade or packaged marinades. Premade or packaged taco seasonings. Relishes. Regular salad dressings. Where to find more information:  National Heart, Lung, and [place]: https://wilson-eaton.com/  American Heart Association: www.heart.org Summary  The DASH eating plan is a healthy eating plan that has been shown to reduce high blood pressure (hypertension). It may also reduce your risk for type 2 diabetes, heart disease, and stroke.  With the DASH eating plan, you should limit salt (sodium) intake to 2,300 mg a day. If you have hypertension, you may need to reduce your sodium intake to 1,500 mg a day.  When on the DASH eating plan, aim to eat more fresh fruits and vegetables, whole grains, lean proteins, low-fat dairy, and heart-healthy fats.  Work with your health care provider or diet and nutrition specialist (dietitian) to adjust your eating plan to your individual calorie needs. This information is not intended to replace advice  given to you by your health care provider. Make sure you discuss any questions you have with your health care provider. Document Released: 12/31/2010 Document Revised: 01/05/2016 Document Reviewed: 01/05/2016 Elsevier Interactive Patient Education  2017 [employer].

## 2016-04-09 DIAGNOSIS — H401131 Primary open-angle glaucoma, bilateral, mild stage: Secondary | ICD-10-CM | POA: Diagnosis not present

## 2016-04-26 ENCOUNTER — Other Ambulatory Visit: Payer: Self-pay | Admitting: Family Medicine

## 2016-05-13 DIAGNOSIS — I8312 Varicose veins of left lower extremity with inflammation: Secondary | ICD-10-CM | POA: Diagnosis not present

## 2016-05-13 DIAGNOSIS — M79605 Pain in left leg: Secondary | ICD-10-CM | POA: Diagnosis not present

## 2016-05-13 DIAGNOSIS — M7981 Nontraumatic hematoma of soft tissue: Secondary | ICD-10-CM | POA: Diagnosis not present

## 2016-05-14 DIAGNOSIS — H401132 Primary open-angle glaucoma, bilateral, moderate stage: Secondary | ICD-10-CM | POA: Diagnosis not present

## 2016-07-08 DIAGNOSIS — L821 Other seborrheic keratosis: Secondary | ICD-10-CM | POA: Diagnosis not present

## 2016-07-08 DIAGNOSIS — L918 Other hypertrophic disorders of the skin: Secondary | ICD-10-CM | POA: Diagnosis not present

## 2016-07-08 DIAGNOSIS — B078 Other viral warts: Secondary | ICD-10-CM | POA: Diagnosis not present

## 2016-07-08 DIAGNOSIS — L814 Other melanin hyperpigmentation: Secondary | ICD-10-CM | POA: Diagnosis not present

## 2016-07-08 DIAGNOSIS — D225 Melanocytic nevi of trunk: Secondary | ICD-10-CM | POA: Diagnosis not present

## 2016-07-08 DIAGNOSIS — D224 Melanocytic nevi of scalp and neck: Secondary | ICD-10-CM | POA: Diagnosis not present

## 2016-08-08 ENCOUNTER — Other Ambulatory Visit: Payer: Self-pay | Admitting: Family Medicine

## 2016-08-10 ENCOUNTER — Other Ambulatory Visit: Payer: Self-pay | Admitting: Family Medicine

## 2016-08-30 ENCOUNTER — Ambulatory Visit: Payer: Medicare Other | Admitting: Family Medicine

## 2016-10-01 ENCOUNTER — Other Ambulatory Visit (INDEPENDENT_AMBULATORY_CARE_PROVIDER_SITE_OTHER): Payer: Medicare Other

## 2016-10-01 DIAGNOSIS — E782 Mixed hyperlipidemia: Secondary | ICD-10-CM | POA: Diagnosis not present

## 2016-10-01 DIAGNOSIS — R739 Hyperglycemia, unspecified: Secondary | ICD-10-CM

## 2016-10-01 LAB — HEMOGLOBIN A1C: Hgb A1c MFr Bld: 6 % (ref 4.6–6.5)

## 2016-10-01 LAB — HDL CHOLESTEROL: HDL: 49.1 mg/dL (ref >39.00)

## 2016-10-01 LAB — LDL CHOLESTEROL, DIRECT: LDL DIRECT: 175 mg/dL

## 2016-10-01 LAB — CHOLESTEROL, TOTAL: Cholesterol: 256 mg/dL — ABNORMAL HIGH (ref 0–200)

## 2016-10-07 ENCOUNTER — Ambulatory Visit (INDEPENDENT_AMBULATORY_CARE_PROVIDER_SITE_OTHER): Payer: Medicare Other | Admitting: Family Medicine

## 2016-10-07 ENCOUNTER — Encounter: Payer: Self-pay | Admitting: Family Medicine

## 2016-10-07 DIAGNOSIS — R739 Hyperglycemia, unspecified: Secondary | ICD-10-CM | POA: Diagnosis not present

## 2016-10-07 DIAGNOSIS — I10 Essential (primary) hypertension: Secondary | ICD-10-CM

## 2016-10-07 DIAGNOSIS — E785 Hyperlipidemia, unspecified: Secondary | ICD-10-CM

## 2016-10-07 NOTE — Patient Instructions (Addendum)
re download myftinesspal or calorieking- target 1600-1800 calories or less per day. May have to reduce further later.   Keep up the exercise - see if you can get to 150 minutes a week.   No changes in meds today. Can continue red yeast rice.

## 2016-10-07 NOTE — Progress Notes (Signed)
Subjective:  Ann Washington is a 66 y.o. year old very pleasant female patient who presents for/with See problem oriented charting ROS- No chest pain or shortness of breath. No headache or blurry vision.  Slight weight loss but intentional.    Past Medical History-  Patient Active Problem List   Diagnosis Date Noted  . CKD (chronic kidney disease), stage III 03/14/2014    Priority: Medium  . Insomnia 03/08/2014    Priority: Medium  . Hyperglycemia 12/09/2009    Priority: Medium  . Hyperlipidemia 10/13/2006    Priority: Medium  . Depression 10/13/2006    Priority: Medium  . Essential hypertension 10/10/2006    Priority: Medium  . Normocytic anemia 09/12/2014    Priority: Low  . Glaucoma 03/08/2014    Priority: Low  . Stress incontinence, female 10/12/2011    Priority: Low  . Vaginal atrophy 05/27/2011    Priority: Low  . Obesity 10/11/2008    Priority: Low  . GERD 08/25/2007    Priority: Low    Medications- reviewed and updated Current Outpatient Prescriptions  Medication Sig Dispense Refill  . amLODipine-valsartan (EXFORGE) 5-320 MG tablet Take 1 tablet by mouth daily. 90 tablet 3  . Calcium Carbonate-Vit D-Min 600-400 MG-UNIT TABS Take by mouth daily.      Marland Kitchen escitalopram (LEXAPRO) 20 MG tablet TAKE 1/2 TABLET BY MOUTH EVERY DAY 15 tablet 5  . meloxicam (MOBIC) 15 MG tablet Take 1 tablet (15 mg total) by mouth daily. 30 tablet 0  . Multiple Vitamin (MULTIVITAMIN) tablet Take 1 tablet by mouth daily.      Marland Kitchen omeprazole (PRILOSEC) 20 MG capsule Take 20 mg by mouth daily.    . travoprost, benzalkonium, (TRAVATAN) 0.004 % ophthalmic solution Place 1 drop into both eyes at bedtime.      Marland Kitchen zolpidem (AMBIEN) 5 MG tablet TAKE 1 TABLET BY MOUTH EVERY DAY AT BEDTIME 30 tablet 5   No current facility-administered medications for this visit.     Objective: BP 138/80 (BP Location: Left Arm, Patient Position: Sitting, Cuff Size: Large)   Pulse (!) 53   Temp 98.4 F (36.9 C)  (Oral)   Ht 5' 2.5" (1.588 m)   Wt 176 lb 6.4 oz (80 kg)   SpO2 98%   BMI 31.75 kg/m  Gen: NAD, resting comfortably CV: RRR no murmurs rubs or gallops Lungs: CTAB no crackles, wheeze, rhonchi Abdomen: soft/nontender/nondistended/normal bowel sounds. obese Ext: no edema Skin: warm, dry  Assessment/Plan:  Essential hypertension S: controlled on exforge (amlodipine 5, valsartan 320mg ) though high normal systolic. No recall was reported to her by pharmacy BP Readings from Last 3 Encounters:  10/07/16 138/80  04/08/16 132/76  04/01/16 (!) 148/84  A/P: We discussed blood pressure goal of <140/90. Continue current meds  Hyperlipidemia S:controlled poorly on no statin (though is Taking 2 red yeast rice once a day) last visit lipids went up despite statin as she had weight gain as well and she wanted to come off as she had myalgias. Goal weight loss was 10- 20 lbs but she has lost 5 lbs- dash eating plan and myfitness pal (states she lost it)  Exercising 75 minutes a week- separates into 3 days a week  Just within 3-4 weeks her myalgias have resolved- discussed most likely not statin related at this point.  Lab Results  Component Value Date   CHOL 256 (H) 10/01/2016   HDL 49.10 10/01/2016   LDLCALC 126 (H) 04/01/2016   LDLDIRECT 175.0 10/01/2016  TRIG 106.0 04/01/2016   CHOLHDL 4 04/01/2016  A/P: ASCVD risk at 12.2%. Discussed statin option- with myalgias she wants to hold off and continue to work on weight loss at least another 10-15 lbs before deciding. If she does not reach her goal within the next year or two or lipid risk remains above 12% will plan on starting statin back- perhaps once weekly burst dosing  Hyperglycemia S: weight loss goal 10-20 lbs goal but did achieve 5. a1c is down very slightly.  Lab Results  Component Value Date   HGBA1C 6.0 10/01/2016  A/P: set goal 10-15 lbs off by follow up. Set up goal for myfintess pal or calorie king with 1600-1800 calories a day-  may need to lower by 100-200 calories a day. Up exercise though is getting half of target 150 minutes a week.    Future Appointments Date Time Provider [place]  04/07/2017 8:15 AM Marin Olp, MD [facility] None   Return in about 6 months (around 04/06/2017) for annual wellness visit with nurse. see me same day or a week later. .  Return precautions advised.  Garret Reddish, MD

## 2016-10-08 ENCOUNTER — Ambulatory Visit: Payer: Medicare Other | Admitting: Family Medicine

## 2016-10-08 NOTE — Assessment & Plan Note (Signed)
S:controlled poorly on no statin (though is Taking 2 red yeast rice once a day) last visit lipids went up despite statin as she had weight gain as well and she wanted to come off as she had myalgias. Goal weight loss was 10- 20 lbs but she has lost 5 lbs- dash eating plan and myfitness pal (states she lost it)  Exercising 75 minutes a week- separates into 3 days a week  Just within 3-4 weeks her myalgias have resolved- discussed most likely not statin related at this point.  Lab Results  Component Value Date   CHOL 256 (H) 10/01/2016   HDL 49.10 10/01/2016   LDLCALC 126 (H) 04/01/2016   LDLDIRECT 175.0 10/01/2016   TRIG 106.0 04/01/2016   CHOLHDL 4 04/01/2016  A/P: ASCVD risk at 12.2%. Discussed statin option- with myalgias she wants to hold off and continue to work on weight loss at least another 10-15 lbs before deciding. If she does not reach her goal within the next year or two or lipid risk remains above 12% will plan on starting statin back- perhaps once weekly burst dosing

## 2016-10-08 NOTE — Assessment & Plan Note (Signed)
S: controlled on exforge (amlodipine 5, valsartan 320mg ) though high normal systolic. No recall was reported to her by pharmacy BP Readings from Last 3 Encounters:  10/07/16 138/80  04/08/16 132/76  04/01/16 (!) 148/84  A/P: We discussed blood pressure goal of <140/90. Continue current meds

## 2016-10-08 NOTE — Assessment & Plan Note (Signed)
S: weight loss goal 10-20 lbs goal but did achieve 5. a1c is down very slightly.  Lab Results  Component Value Date   HGBA1C 6.0 10/01/2016  A/P: set goal 10-15 lbs off by follow up. Set up goal for myfintess pal or calorie king with 1600-1800 calories a day- may need to lower by 100-200 calories a day. Up exercise though is getting half of target 150 minutes a week.

## 2016-10-22 DIAGNOSIS — H43813 Vitreous degeneration, bilateral: Secondary | ICD-10-CM | POA: Diagnosis not present

## 2016-10-22 DIAGNOSIS — H401132 Primary open-angle glaucoma, bilateral, moderate stage: Secondary | ICD-10-CM | POA: Diagnosis not present

## 2016-10-22 DIAGNOSIS — H2513 Age-related nuclear cataract, bilateral: Secondary | ICD-10-CM | POA: Diagnosis not present

## 2016-10-22 DIAGNOSIS — H524 Presbyopia: Secondary | ICD-10-CM | POA: Diagnosis not present

## 2016-11-01 DIAGNOSIS — Z23 Encounter for immunization: Secondary | ICD-10-CM | POA: Diagnosis not present

## 2016-11-18 ENCOUNTER — Ambulatory Visit (INDEPENDENT_AMBULATORY_CARE_PROVIDER_SITE_OTHER): Payer: Medicare Other | Admitting: Podiatry

## 2016-11-18 ENCOUNTER — Encounter: Payer: Self-pay | Admitting: Podiatry

## 2016-11-18 DIAGNOSIS — M76822 Posterior tibial tendinitis, left leg: Secondary | ICD-10-CM | POA: Diagnosis not present

## 2016-11-20 NOTE — Progress Notes (Signed)
She presents today after having not been here to see me in over 2 years. She states that the ankle release never stopped hurting but has really been bad for the last 3 weeks last time she was in the injection seemed to help.  Objective: Vital signs are stable she is alert and oriented 3. Pulses are palpable. She has pain on palpation of the posterior tibial tendon as it courses beneath the medial malleolus extending to the navicular tuberosity of the left foot.  Assessment: Posterior tibial tendinitis.  Plan: Injected the area today with Kenalog and local anesthetic follow-up with her on an as-needed basis. Discussed appropriate shoe gear stretching exercises ice therapy as she modifications.

## 2016-12-07 ENCOUNTER — Ambulatory Visit (INDEPENDENT_AMBULATORY_CARE_PROVIDER_SITE_OTHER): Payer: Medicare Other | Admitting: Podiatry

## 2016-12-07 ENCOUNTER — Encounter: Payer: Self-pay | Admitting: Podiatry

## 2016-12-07 DIAGNOSIS — S96912D Strain of unspecified muscle and tendon at ankle and foot level, left foot, subsequent encounter: Secondary | ICD-10-CM

## 2016-12-07 DIAGNOSIS — M76822 Posterior tibial tendinitis, left leg: Secondary | ICD-10-CM

## 2016-12-08 ENCOUNTER — Telehealth: Payer: Self-pay | Admitting: *Deleted

## 2016-12-08 DIAGNOSIS — M76822 Posterior tibial tendinitis, left leg: Secondary | ICD-10-CM

## 2016-12-08 DIAGNOSIS — S96912D Strain of unspecified muscle and tendon at ankle and foot level, left foot, subsequent encounter: Secondary | ICD-10-CM

## 2016-12-08 NOTE — Telephone Encounter (Signed)
Faxed orders to [facility]. 

## 2016-12-08 NOTE — Progress Notes (Signed)
She presents today for follow-up of her posterior tibial tendinitis of the left foot. She states that I feel that we did last time really didn't help. She states that she had her family about to go medication.  Objective: Vital signs are stable she is alert and oriented 3. Pulses are palpable. She still has an area of fluctuance just beneath the medial malleolus near the posterior tibial tendon. She has pain on inversion against resistance.  Assessment: Posterior tibial tendinitis more than likely there is a tear in the area as it courses beneath the medial malleolus extending to the navicular.  Plan: Since this area has undergone extensive treatment with failure to alleviate the patient's symptoms until MRI is necessary to evaluate tendon for surgical correction. Since she is about to leave for holiday, I will small amount of dexamethasone 30-gauge needle to the point of maximal tenderness with a fluctuance is. She will more than likely be scheduled for MRI when she returns from her trip. I also put her in a angle stabilizer to help offload posterior tibial tendon while she is ambulating at [employer]. Follow up with her once the MRI results are in

## 2016-12-08 NOTE — Telephone Encounter (Signed)
-----   Message from Rip Harbour, [facility] sent at 12/07/2016  5:24 PM EST ----- Regarding: MRI MRI rearfoot left - evaluate posterior tibial tendon tear left - surgical consideration

## 2016-12-22 ENCOUNTER — Ambulatory Visit
Admission: RE | Admit: 2016-12-22 | Discharge: 2016-12-22 | Disposition: A | Discharge status: Home / Self Care | Payer: Medicare Other | Source: Ambulatory Visit | Attending: Podiatry | Admitting: Podiatry

## 2016-12-22 DIAGNOSIS — M76822 Posterior tibial tendinitis, left leg: Secondary | ICD-10-CM | POA: Diagnosis not present

## 2017-01-04 ENCOUNTER — Ambulatory Visit: Payer: Medicare Other | Admitting: Podiatry

## 2017-01-06 ENCOUNTER — Encounter: Payer: Self-pay | Admitting: Podiatry

## 2017-01-06 ENCOUNTER — Ambulatory Visit (INDEPENDENT_AMBULATORY_CARE_PROVIDER_SITE_OTHER): Payer: Medicare Other | Admitting: Podiatry

## 2017-01-06 DIAGNOSIS — S96912D Strain of unspecified muscle and tendon at ankle and foot level, left foot, subsequent encounter: Secondary | ICD-10-CM | POA: Diagnosis not present

## 2017-01-06 NOTE — Progress Notes (Signed)
She presents today for follow-up of her left foot.  She states that it actually did very well while she was in [employer].  States that it seems to be doing better at this point.  Objective: Vital signs are stable alert and oriented x3.  Pulses are palpable.  She still has pain and swelling along palpation of the posterior tibial tendon from posterior superior medial malleolus distally to the navicular tuberosity.  MRI does demonstrate a 3.5 cm split tear in the posterior tibial tendon as well as a tear extending to the level of the neck of the talus.  Assessment: Posterior tibial tendon tear left.  Plan: Discussed with her in great detail today surgical intervention however she would like to hold off until his absolute necessary.  I did discuss the possible consequences of not having surgery done immediately she understands that and is amenable to it and will follow up with me on an as-needed basis.

## 2017-01-11 ENCOUNTER — Other Ambulatory Visit: Payer: Self-pay | Admitting: Family Medicine

## 2017-02-08 ENCOUNTER — Telehealth: Payer: Self-pay | Admitting: Family Medicine

## 2017-02-10 NOTE — Telephone Encounter (Signed)
Called and spoke with patient. I informed her that we had faxed over the prescription refill both on 02/08/17 & 02/10/17. She states the pharmacy told her she needed an appointment. She was just seen in September. I asked her to let me know if they still continued to give her difficulty about getting her prescription filled. She verbalized understanding

## 2017-02-10 NOTE — Telephone Encounter (Signed)
Please contact patient to update about denial of medication.

## 2017-02-10 NOTE — Telephone Encounter (Signed)
Pt was told by pharmacy that her zolpidem was denied. Please call cell phone [phone]

## 2017-03-08 ENCOUNTER — Telehealth: Payer: Self-pay | Admitting: *Deleted

## 2017-03-08 NOTE — Telephone Encounter (Signed)
"  I know I need to have surgery by Dr. Milinda Pointer.  What's my next step?"  You need to schedule an appointment with Dr. Milinda Pointer for a consultation.  "Can you do that for me?"  I can transfer you to a scheduler.  "Please do that."

## 2017-03-08 NOTE — Telephone Encounter (Signed)
Pt states she would call again.

## 2017-03-10 ENCOUNTER — Other Ambulatory Visit: Payer: Self-pay | Admitting: Family Medicine

## 2017-03-10 DIAGNOSIS — Z01419 Encounter for gynecological examination (general) (routine) without abnormal findings: Secondary | ICD-10-CM | POA: Diagnosis not present

## 2017-03-10 DIAGNOSIS — Z1231 Encounter for screening mammogram for malignant neoplasm of breast: Secondary | ICD-10-CM | POA: Diagnosis not present

## 2017-03-11 ENCOUNTER — Other Ambulatory Visit: Payer: Self-pay

## 2017-03-11 ENCOUNTER — Telehealth: Payer: Self-pay | Admitting: *Deleted

## 2017-03-11 MED ORDER — ZOLPIDEM TARTRATE 5 MG PO TABS: 5.0000 mg | ORAL_TABLET | Freq: Every day | ORAL | 5 refills | Status: DC

## 2017-03-11 NOTE — Telephone Encounter (Signed)
Refill request received via fax from [facility] on [address]  Requesting Zolpidem 5 mg tablets, #30.

## 2017-03-11 NOTE — Telephone Encounter (Signed)
Prescription sent to pharmacy as requested.

## 2017-03-18 ENCOUNTER — Encounter: Payer: Self-pay | Admitting: Family Medicine

## 2017-03-18 ENCOUNTER — Ambulatory Visit (INDEPENDENT_AMBULATORY_CARE_PROVIDER_SITE_OTHER): Payer: Medicare Other | Admitting: Family Medicine

## 2017-03-18 DIAGNOSIS — N183 Chronic kidney disease, stage 3 unspecified: Secondary | ICD-10-CM

## 2017-03-18 DIAGNOSIS — I10 Essential (primary) hypertension: Secondary | ICD-10-CM

## 2017-03-18 DIAGNOSIS — E785 Hyperlipidemia, unspecified: Secondary | ICD-10-CM | POA: Diagnosis not present

## 2017-03-18 DIAGNOSIS — F3342 Major depressive disorder, recurrent, in full remission: Secondary | ICD-10-CM

## 2017-03-18 DIAGNOSIS — R739 Hyperglycemia, unspecified: Secondary | ICD-10-CM | POA: Diagnosis not present

## 2017-03-18 LAB — HEMOGLOBIN A1C: HEMOGLOBIN A1C: 5.9 % (ref 4.6–6.5)

## 2017-03-18 LAB — BASIC METABOLIC PANEL
BUN: 22 mg/dL (ref 6–23)
CHLORIDE: 105 meq/L (ref 96–112)
CO2: 27 meq/L (ref 19–32)
CREATININE: 0.73 mg/dL (ref 0.40–1.20)
Calcium: 9.7 mg/dL (ref 8.4–10.5)
GFR: 84.21 mL/min (ref >60.00)
Glucose, Bld: 98 mg/dL (ref 70–99)
POTASSIUM: 4.4 meq/L (ref 3.5–5.1)
Sodium: 139 mEq/L (ref 135–145)

## 2017-03-18 LAB — LDL CHOLESTEROL, DIRECT: Direct LDL: 166 mg/dL

## 2017-03-18 NOTE — Patient Instructions (Addendum)
I would also like for you to sign up for an annual wellness visit with one of our nurses, Cassie or Manuela Schwartz, who both specialize in the annual wellness visit. This is a free benefit under medicare that may help [place] find additional ways to help you. Some highlights are reviewing medications, lifestyle, and doing a dementia screen.   You an do this visit at same time you see me in 4-6 months or do that visit a few days before. Come fasting to next visit with me.   If a1c is 6.1 or above I will recommend nutrition visit- otherwise cut back on portions. Would love for you to exercise but I understand your limitations right now  Lets set a goal of 10 lbs off in next 4-6 months.

## 2017-03-18 NOTE — Assessment & Plan Note (Signed)
S: controlled on amlodipine-valsartan 5-320mg  BP Readings from Last 3 Encounters:  03/18/17 132/82  10/07/16 138/80  04/08/16 132/76  A/P: We discussed blood pressure goal of <140/90. Continue current meds:  We discussed trying to get <130/80 with weight loss

## 2017-03-18 NOTE — Assessment & Plan Note (Signed)
Weight trending up- needs to reverse this as this was the goal- lose weight over starting statin (had myalgias in past on pravastatin). Can get full lipids next visit and check in on weight. Continue red yeast rice

## 2017-03-18 NOTE — Progress Notes (Addendum)
Subjective:  Ann Washington is a 67 y.o. year old very pleasant female patient who presents for/with See problem oriented charting ROS- No chest pain or shortness of breath. No headache or blurry vision. Has had slight weight gain   Past Medical History-  Patient Active Problem List   Diagnosis Date Noted  . CKD (chronic kidney disease), stage III ([place]) 03/14/2014    Priority: Medium  . Insomnia 03/08/2014    Priority: Medium  . Hyperglycemia 12/09/2009    Priority: Medium  . Hyperlipidemia 10/13/2006    Priority: Medium  . Major depression, recurrent, full remission ([place]) 10/13/2006    Priority: Medium  . Hypertension, essential 10/10/2006    Priority: Medium  . Normocytic anemia 09/12/2014    Priority: Low  . Glaucoma 03/08/2014    Priority: Low  . Stress incontinence, female 10/12/2011    Priority: Low  . Vaginal atrophy 05/27/2011    Priority: Low  . Obesity 10/11/2008    Priority: Low  . GERD 08/25/2007    Priority: Low    Medications- reviewed and updated Current Outpatient Medications  Medication Sig Dispense Refill  . amLODipine-valsartan (EXFORGE) 5-320 MG tablet Take 1 tablet by mouth daily. 90 tablet 3  . Calcium Carbonate-Vit D-Min 600-400 MG-UNIT TABS Take by mouth daily.      Marland Kitchen escitalopram (LEXAPRO) 20 MG tablet TAKE 1/2 TABLET BY MOUTH EVERY DAY 15 tablet 5  . escitalopram (LEXAPRO) 20 MG tablet TAKE 1/2 TABLET BY MOUTH EVERY DAY 15 tablet 5  . Multiple Vitamin (MULTIVITAMIN) tablet Take 1 tablet by mouth daily.      Marland Kitchen omeprazole (PRILOSEC) 20 MG capsule Take 20 mg by mouth daily.    . Red Yeast Rice Extract (RED YEAST RICE PO) Take 2 tablets by mouth at bedtime.    . timolol (TIMOPTIC) 0.5 % ophthalmic solution Place 1 drop into both eyes 2 (two) times daily.  0  . travoprost, benzalkonium, (TRAVATAN) 0.004 % ophthalmic solution Place 1 drop into both eyes at bedtime.      Marland Kitchen zolpidem (AMBIEN) 5 MG tablet Take 1 tablet (5 mg total) by mouth at bedtime.  30 tablet 5   No current facility-administered medications for this visit.     Objective: BP 132/82   Pulse 60   Temp 98.1 F (36.7 C) (Oral)   Ht 5' 2.5" (1.588 m)   Wt 179 lb 12.8 oz (81.6 kg)   SpO2 96%   BMI 32.36 kg/m  Gen: NAD, resting comfortably CV: RRR no murmurs rubs or gallops Lungs: CTAB no crackles, wheeze, rhonchi Abdomen: soft/nontender/nondistended/normal bowel sounds.obese Ext: no edema Skin: warm, dry Neuro: grossly normal, moves all extremities  Assessment/Plan:  Regarding potential surgery- Able to do 4 mets without chest pain or shortness of breath  Hypertension, essential S: controlled on amlodipine-valsartan 5-320mg  BP Readings from Last 3 Encounters:  03/18/17 132/82  10/07/16 138/80  04/08/16 132/76  A/P: We discussed blood pressure goal of <140/90. Continue current meds:  We discussed trying to get <130/80 with weight loss  Hyperglycemia S: a1c as high as 6.1 in past. Weight up with holidays, going to disney, foot issues- Dr. Milinda Pointer podiatry- tendon tear and needs surgery setting her back.  Lab Results  Component Value Date   HGBA1C 6.0 10/01/2016  A/P: Encouraged need for healthy eating (she feels portion control is her big need), regular exercise (may be limited with foot), weight loss.  She would not want to try metformin- discussed possible nutrition referral  if a1c above 6- she will consider.    CKD (chronic kidney disease), stage III S: discussed diagnosis again today- she seemed surprised so tried to educate better. has CKD III with GFR in 50-60 range. Knows to avoid nsaids- but didn't realize advil was one- discussed tylenol only.  A/P: push for risk factor modification with reducing risk of diabetes, controlling bp and lipids and avoiding nsaids  Major depression, recurrent, full remission ([place]) S: Patient is compliant with lexapro 10mg  (half of 20mg  dose). Takes ambien 5mg  as well to help with sleep A/P: phq9 of 0- Jamie to input.  Well controlled. No change in rx  Hyperlipidemia Weight trending up- needs to reverse this as this was the goal- lose weight over starting statin (had myalgias in past on pravastatin). Can get full lipids next visit and check in on weight. Continue red yeast rice  Future Appointments  Date Time Provider [place]  03/24/2017 10:15 AM Garrel Ridgel, DPM [facility]   Follow up "I would also like for you to sign up for an annual wellness visit with one of our nurses, Cassie or Manuela Schwartz, who both specialize in the annual wellness visit. This is a free benefit under medicare that may help [place] find additional ways to help you. Some highlights are reviewing medications, lifestyle, and doing a dementia screen.   You an do this visit at same time you see me in 4-6 months or do that visit a few days before. "  Lab/Order associations: Hypertension, essential - Plan: Basic metabolic panel, LDL cholesterol, direct  Hyperglycemia - Plan: Hemoglobin A1c  Hyperlipidemia, unspecified hyperlipidemia type - Plan: LDL cholesterol, direct  Return precautions advised.  Garret Reddish, MD

## 2017-03-18 NOTE — Assessment & Plan Note (Signed)
S: Patient is compliant with lexapro 10mg  (half of 20mg  dose). Takes ambien 5mg  as well to help with sleep A/P: phq9 of 0- Jamie to input. Well controlled. No change in rx

## 2017-03-18 NOTE — Assessment & Plan Note (Signed)
S: discussed diagnosis again today- she seemed surprised so tried to educate better. has CKD III with GFR in 50-60 range. Knows to avoid nsaids- but didn't realize advil was one- discussed tylenol only.  A/P: push for risk factor modification with reducing risk of diabetes, controlling bp and lipids and avoiding nsaids

## 2017-03-18 NOTE — Assessment & Plan Note (Addendum)
S: a1c as high as 6.1 in past. Weight up with holidays, going to [place], foot issues- Dr. Milinda Pointer podiatry- tendon tear and needs surgery setting her back.  Lab Results  Component Value Date   HGBA1C 6.0 10/01/2016  A/P: Encouraged need for healthy eating (she feels portion control is her big need), regular exercise (may be limited with foot), weight loss.  She would not want to try metformin- discussed possible nutrition referral if a1c above 6- she will consider.

## 2017-03-24 ENCOUNTER — Encounter: Payer: Self-pay | Admitting: Podiatry

## 2017-03-24 ENCOUNTER — Encounter: Payer: Self-pay | Admitting: Family Medicine

## 2017-03-24 ENCOUNTER — Ambulatory Visit (INDEPENDENT_AMBULATORY_CARE_PROVIDER_SITE_OTHER): Payer: Medicare Other | Admitting: Podiatry

## 2017-03-24 DIAGNOSIS — M76822 Posterior tibial tendinitis, left leg: Secondary | ICD-10-CM | POA: Diagnosis not present

## 2017-03-24 DIAGNOSIS — S96912D Strain of unspecified muscle and tendon at ankle and foot level, left foot, subsequent encounter: Secondary | ICD-10-CM | POA: Diagnosis not present

## 2017-03-24 NOTE — Patient Instructions (Signed)
Pre-Operative Instructions  Congratulations, you have decided to take an important step towards improving your quality of life.  You can be assured that the doctors and staff at [facility] will be with you every step of the way.  Here are some important things you should know:  1. Plan to be at the surgery center/hospital at least 1 (one) hour prior to your scheduled time, unless otherwise directed by the surgical center/hospital staff.  You must have a responsible adult accompany you, remain during the surgery and drive you home.  Make sure you have directions to the surgical center/hospital to ensure you arrive on time. 2. If you are having surgery at [facility] or [facility], you will need a copy of your medical history and physical form from your family physician within one month prior to the date of surgery. We will give you a form for your primary physician to complete.  3. We make every effort to accommodate the date you request for surgery.  However, there are times where surgery dates or times have to be moved.  We will contact you as soon as possible if a change in schedule is required.   4. No aspirin/ibuprofen for one week before surgery.  If you are on aspirin, any non-steroidal anti-inflammatory medications (Mobic, Aleve, Ibuprofen) should not be taken seven (7) days prior to your surgery.  You make take Tylenol for pain prior to surgery.  5. Medications - If you are taking daily heart and blood pressure medications, seizure, reflux, allergy, asthma, anxiety, pain or diabetes medications, make sure you notify the surgery center/hospital before the day of surgery so they can tell you which medications you should take or avoid the day of surgery. 6. No food or drink after midnight the night before surgery unless directed otherwise by surgical center/hospital staff. 7. No alcoholic beverages 24-hours prior to surgery.  No smoking 24-hours prior or 24-hours after  surgery. 8. Wear loose pants or shorts. They should be loose enough to fit over bandages, boots, and casts. 9. Don't wear slip-on shoes. Sneakers are preferred. 10. Bring your boot with you to the surgery center/hospital.  Also bring crutches or a walker if your physician has prescribed it for you.  If you do not have this equipment, it will be provided for you after surgery. 11. If you have not been contacted by the surgery center/hospital by the day before your surgery, call to confirm the date and time of your surgery. 12. Leave-time from work may vary depending on the type of surgery you have.  Appropriate arrangements should be made prior to surgery with your employer. 13. Prescriptions will be provided immediately following surgery by your doctor.  Fill these as soon as possible after surgery and take the medication as directed. Pain medications will not be refilled on weekends and must be approved by the doctor. 14. Remove nail polish on the operative foot and avoid getting pedicures prior to surgery. 15. Wash the night before surgery.  The night before surgery wash the foot and leg well with water and the antibacterial soap provided. Be sure to pay special attention to beneath the toenails and in between the toes.  Wash for at least three (3) minutes. Rinse thoroughly with water and dry well with a towel.  Perform this wash unless told not to do so by your physician.  Enclosed: 1 Ice pack (please put in freezer the night before surgery)   1 Hibiclens skin cleaner     Pre-op instructions  If you have any questions regarding the instructions, please do not hesitate to call our office.  Center: [address], [place], [place] [address] -- [phone]  [place]: [address] [place], [place] [address] -- [phone]  [place]: [address]  [place], [place] [address] -- [phone]  [place]: [address], [address], [place], [place] [address] -- [phone]  Website:  [email or web address] 

## 2017-03-24 NOTE — Progress Notes (Signed)
She presents today for surgical consult regarding the medial aspect of her left ankle.  She states that it has not improved over the past several months.  Objective: Vital signs are stable she is alert and oriented x3.  I have reviewed her past medical history medications allergies surgery social history.  Pulses are strongly palpable neurologic sensorium is intact.  Degenerative flexors are intact.  He has pain on palpation of the posterior tibial tendon as it courses posterior inferior to and distal to the medial malleolus.  She has pain on palpation of the navicular tuberosity.  I reviewed the MRI results which do demonstrate posterior tibial tendon tear.  Assessment posterior tibial tendon tear left.  Plan: Discussed etiology pathology conservative versus surgical therapies at this point time I consented her for a primary repair of the posterior tibial tendon with a Kidner procedure and a cast application.  We discussed in great detail today that this would be in the need to be a nonweightbearing cast and she would need to be out of work for no less than 4 weeks.  She understands this and is amenable to it.  We did discuss possible postop complications which may include but are not limited to postop pain bleeding swelling infection recurrence need further surgery overcorrection under correction.  We provided her with written information regarding the surgery center and anesthesia group I will follow-up with her in the near future for surgery.

## 2017-03-25 ENCOUNTER — Telehealth: Payer: Self-pay | Admitting: *Deleted

## 2017-03-25 NOTE — Telephone Encounter (Signed)
I'm calling in regards to your surgery.  You were given a date of April 5, that date is not available.  Dr. Milinda Pointer can do it on April 12.  Will that date be okay for you?  "I guess so but what are the chances that someone may cancel?  I'd like to do it on the fifth if possible."  I'll put you on the waiting list, if there's any cancellations, I'll give you a call.  Thank you.

## 2017-03-28 ENCOUNTER — Telehealth: Payer: Self-pay | Admitting: *Deleted

## 2017-03-28 NOTE — Telephone Encounter (Signed)
"  I'm scheduled for surgery on 05/06/2017.  I'd like to reschedule it to April 26."  Dr. Milinda Pointer is not doing surgery on April 26.  "Well, what's the next available date after that date?"  He can do it on May 3.  "Okay, put me down for that date.  I really wanted it done on April 5th but you didn't have anything available."  I will get it rescheduled to 05/27/17.    I called Caren Griffins at [facility] and rescheduled her surgery from 05/06/2017 to 05/27/2017.

## 2017-04-07 ENCOUNTER — Ambulatory Visit: Payer: Medicare Other | Admitting: Family Medicine

## 2017-04-14 ENCOUNTER — Other Ambulatory Visit: Payer: Self-pay

## 2017-04-14 MED ORDER — AMLODIPINE BESYLATE-VALSARTAN 5-320 MG PO TABS: 1.0000 | ORAL_TABLET | Freq: Every day | ORAL | 3 refills | Status: DC

## 2017-04-18 ENCOUNTER — Other Ambulatory Visit: Payer: Self-pay

## 2017-04-18 MED ORDER — ZOLPIDEM TARTRATE 5 MG PO TABS: 5.0000 mg | ORAL_TABLET | Freq: Every day | ORAL | 5 refills | Status: DC

## 2017-04-22 DIAGNOSIS — H401132 Primary open-angle glaucoma, bilateral, moderate stage: Secondary | ICD-10-CM | POA: Diagnosis not present

## 2017-04-26 ENCOUNTER — Ambulatory Visit: Payer: Self-pay | Admitting: *Deleted

## 2017-04-26 NOTE — Telephone Encounter (Signed)
Called and spoke with patient who verbalized understanding.  

## 2017-04-26 NOTE — Telephone Encounter (Signed)
Patient was up all night with vomiting and diarrhea. Would like to know what she can take or can something be called in? [facility] [address]     Patient is calling to report that she started diarrhea and vomiting last night- that has continued into the morning. Patient reports no other symptoms. She denies stomach pain or Gi symptoms. She has not taken anything for her symptoms ay this time. She has managed to keep a small amount of fluid down at this point. Offered appointment- but patient feels she can not leave her home now due to her symptoms. Advised to keep trying to add fluids slowly and then cracker,toast, etc. Also discussed after her stomach settles she could try using an antidiarrheal medication if appropriate.  Will send message to Dr Yong Channel for review and advisement. Best contact for patient is [phone]  Reason for Disposition . [1] SEVERE diarrhea (e.g., 7 or more times / day more than normal) AND [2]  age > 60 years  Answer Assessment - Initial Assessment Questions 1. DIARRHEA SEVERITY: "How bad is the diarrhea?" "How many extra stools have you had in the past 24 hours than normal?"    - MILD: Few loose or mushy BMs; increase of 1-3 stools over normal daily number of stools; mild increase in ostomy output.   - MODERATE: Increase of 4-6 stools daily over normal; moderate increase in ostomy output.   - SEVERE (or Worst Possible): Increase of 7 or more stools daily over normal; moderate increase in ostomy output; incontinence.     Pure water- severe 2. ONSET: "When did the diarrhea begin?"      Last night- 10:00pm 3. BM CONSISTENCY: "How loose or watery is the diarrhea?"      water 4. VOMITING: "Are you also vomiting?" If so, ask: "How many times in the past 24 hours?"      Vomiting- 11:00   5 times 5. ABDOMINAL PAIN: "Are you having any abdominal pain?" If yes: "What does it feel like?" (e.g., crampy, dull, intermittent, constant)      No abdominal pain 6. ABDOMINAL PAIN  SEVERITY: If present, ask: "How bad is the pain?"  (e.g., Scale 1-10; mild, moderate, or severe)    - MILD (1-3): doesn't interfere with normal activities, abdomen soft and not tender to touch     - MODERATE (4-7): interferes with normal activities or awakens from sleep, tender to touch     - SEVERE (8-10): excruciating pain, doubled over, unable to do any normal activities       No pian 7. ORAL INTAKE: If vomiting, "Have you been able to drink liquids?" "How much fluids have you had in the past 24 hours?"     Couple tablespoons of lemon lime so far 8. HYDRATION: "Any signs of dehydration?" (e.g., dry mouth [not just dry lips], too weak to stand, dizziness, new weight loss) "When did you last urinate?"     Some fatigue- 3:00am 9. EXPOSURE: "Have you traveled to a foreign country recently?" "Have you been exposed to anyone with diarrhea?" "Could you have eaten any food that was spoiled?"     no 10. OTHER SYMPTOMS: "Do you have any other symptoms?" (e.g., fever, blood in stool)       Chills- can't get warm 11. PREGNANCY: "Is there any chance you are pregnant?" "When was your last menstrual period?"       n/a  Protocols used: DIARRHEA-A-AH

## 2017-04-26 NOTE — Telephone Encounter (Signed)
Most of these illnesses are lasting 24-48 hours. As long as she doesn't have blood in her stool- she can take imodium over the counter up to 3 doses in 24 hours. I would have her hold her amlodipine-valsartan until her intake is better. New or worsening symptoms or ones that last over 72 hours she should be seen. Strongly encourage her to push fluids as much as possible. May want to alternate water and gatorade.

## 2017-05-24 ENCOUNTER — Telehealth: Payer: Self-pay | Admitting: *Deleted

## 2017-05-24 ENCOUNTER — Encounter: Payer: Self-pay | Admitting: Podiatry

## 2017-05-24 DIAGNOSIS — Z9889 Other specified postprocedural states: Secondary | ICD-10-CM

## 2017-05-24 NOTE — Telephone Encounter (Signed)
Pt request Knee scooter to use after surgery but would like it ordered before surgery. Faxed order to [place] and Community Message to [facility] [place].

## 2017-05-26 ENCOUNTER — Other Ambulatory Visit: Payer: Self-pay | Admitting: Podiatry

## 2017-05-26 MED ORDER — OXYCODONE-ACETAMINOPHEN 10-325 MG PO TABS: 1.0000 | ORAL_TABLET | ORAL | 0 refills | Status: AC | PRN

## 2017-05-26 MED ORDER — CEPHALEXIN 500 MG PO CAPS
500.0000 mg | ORAL_CAPSULE | Freq: Three times a day (TID) | ORAL | 0 refills | Status: DC
Start: 2017-05-26 — End: 2017-08-11

## 2017-05-26 MED ORDER — ONDANSETRON HCL 4 MG PO TABS: 4.0000 mg | ORAL_TABLET | Freq: Three times a day (TID) | ORAL | 0 refills | Status: DC | PRN

## 2017-05-27 ENCOUNTER — Encounter: Payer: Self-pay | Admitting: Podiatry

## 2017-05-27 ENCOUNTER — Telehealth: Payer: Self-pay | Admitting: *Deleted

## 2017-05-27 DIAGNOSIS — Y939 Activity, unspecified: Secondary | ICD-10-CM | POA: Diagnosis not present

## 2017-05-27 DIAGNOSIS — X58XXXA Exposure to other specified factors, initial encounter: Secondary | ICD-10-CM | POA: Diagnosis not present

## 2017-05-27 DIAGNOSIS — Y929 Unspecified place or not applicable: Secondary | ICD-10-CM | POA: Diagnosis not present

## 2017-05-27 DIAGNOSIS — M6702 Short Achilles tendon (acquired), left ankle: Secondary | ICD-10-CM | POA: Diagnosis not present

## 2017-05-27 DIAGNOSIS — S96912A Strain of unspecified muscle and tendon at ankle and foot level, left foot, initial encounter: Secondary | ICD-10-CM | POA: Diagnosis not present

## 2017-05-27 DIAGNOSIS — M25572 Pain in left ankle and joints of left foot: Secondary | ICD-10-CM | POA: Diagnosis not present

## 2017-05-27 DIAGNOSIS — E7800 Pure hypercholesterolemia, unspecified: Secondary | ICD-10-CM | POA: Diagnosis not present

## 2017-05-27 DIAGNOSIS — Y999 Unspecified external cause status: Secondary | ICD-10-CM | POA: Diagnosis not present

## 2017-05-27 DIAGNOSIS — S86112A Strain of other muscle(s) and tendon(s) of posterior muscle group at lower leg level, left leg, initial encounter: Secondary | ICD-10-CM | POA: Diagnosis not present

## 2017-05-27 DIAGNOSIS — M66362 Spontaneous rupture of flexor tendons, left lower leg: Secondary | ICD-10-CM | POA: Diagnosis not present

## 2017-05-27 NOTE — Telephone Encounter (Signed)
Faxed orders to [place] again.

## 2017-05-30 ENCOUNTER — Telehealth: Payer: Self-pay | Admitting: *Deleted

## 2017-05-30 NOTE — Telephone Encounter (Signed)
CALLED PT-LEFT MESSAGE TO CALL IF SHE HAD ANY CONCERNS   POST OP CALL-    1) General condition stated by the patient:   2) Is the pt having pain?   3) Pain score:   4) Has the pt taken Rx'd medication?   5) Is the pain medication giving relief?  6) Any fever, chills, nausea, or vomiting?  7) Any shortness of breath or tightness in the calf?  8) Is the bandages clean, dry and intact?  9) Is the bandage excessively tight?  10) Is there excessive bleeding or drainage coming through the bandage?  11) Did you understand all of the post op instruction sheet given?  12) Any questions or concerns regarding post op care/recovery?    Confirmed POV appointment with patient

## 2017-06-02 ENCOUNTER — Encounter: Payer: Self-pay | Admitting: Podiatry

## 2017-06-02 ENCOUNTER — Ambulatory Visit (INDEPENDENT_AMBULATORY_CARE_PROVIDER_SITE_OTHER): Payer: Medicare Other | Admitting: Podiatry

## 2017-06-02 ENCOUNTER — Ambulatory Visit (INDEPENDENT_AMBULATORY_CARE_PROVIDER_SITE_OTHER): Payer: Medicare Other

## 2017-06-02 VITALS — BP 144/78 | HR 69 | Temp 99.4°F | Resp 16

## 2017-06-02 DIAGNOSIS — S96912D Strain of unspecified muscle and tendon at ankle and foot level, left foot, subsequent encounter: Secondary | ICD-10-CM

## 2017-06-02 DIAGNOSIS — M76822 Posterior tibial tendinitis, left leg: Secondary | ICD-10-CM

## 2017-06-02 NOTE — Progress Notes (Signed)
She presents today for her first postop visit she is status post posterior tibial tendon repair with cast left foot.  She states that she seems to be doing fine the numbness wore off about Monday.  Denies fever chills nausea vomiting muscle aches pain calf pain chest pain shortness of breath.  States that she cannot use the crutches so she is ordered a knee scooter and is using a wheelchair.  She also states that she has been standing on the tip of the cast balancing herself.  Objective: Vital signs are stable she is alert and oriented x3.  Pulses are palpable behind the knee.  The cast is loose proximally she has good sensation to her toes.  She has good range of motion of her toes.  Radiographs taken today demonstrate an anchor intact to the navicular tuberosity and staples in the skin.  Does not appear to be a lot of edema present.  Assessment: Well-healing surgical foot left.  Plan: Follow-up with [place] in 1 week for cast change.

## 2017-06-08 ENCOUNTER — Other Ambulatory Visit: Payer: Self-pay

## 2017-06-08 MED ORDER — ESCITALOPRAM OXALATE 20 MG PO TABS: 10.0000 mg | ORAL_TABLET | Freq: Every day | ORAL | 5 refills | Status: DC

## 2017-06-09 ENCOUNTER — Ambulatory Visit (INDEPENDENT_AMBULATORY_CARE_PROVIDER_SITE_OTHER): Payer: Medicare Other | Admitting: Podiatry

## 2017-06-09 DIAGNOSIS — M76822 Posterior tibial tendinitis, left leg: Secondary | ICD-10-CM

## 2017-06-09 DIAGNOSIS — S96912D Strain of unspecified muscle and tendon at ankle and foot level, left foot, subsequent encounter: Secondary | ICD-10-CM

## 2017-06-23 ENCOUNTER — Encounter: Payer: Self-pay | Admitting: Podiatry

## 2017-06-23 ENCOUNTER — Ambulatory Visit (INDEPENDENT_AMBULATORY_CARE_PROVIDER_SITE_OTHER): Payer: Medicare Other | Admitting: Podiatry

## 2017-06-23 VITALS — BP 122/78 | HR 63 | Temp 98.4°F

## 2017-06-23 DIAGNOSIS — M76822 Posterior tibial tendinitis, left leg: Secondary | ICD-10-CM

## 2017-06-26 NOTE — Progress Notes (Signed)
She presents today nearly 4 weeks status post repair of posterior tibial tendon left.  She states that I am just ready to get this cast off.  She denies calf pain chest pain shortness of breath.  Denies fever chills nausea vomiting muscle aches and pains.  Objective: Vital signs are stable alert and oriented x3.  Pulses are palpable.  Cast was removed pulses to the foot are palpable.  Staples are intact margins well coapted there is minimal edema no erythema cellulitis drainage or odor staples were removed today margins remain well coapted she has good inversion against resistance mild tenderness on palpation.  Assessment: Well-healing surgical foot left.  Status post Kidner procedure date of surgery 05/27/2017.  Plan: Redressed the foot today with compression placed her in a Cam walker request that she stay in the cam walker for the next 2 weeks nonambulatory.  She understands this and is amenable to it we will follow-up with me in 2 weeks at which time we will start partial weightbearing.

## 2017-07-07 ENCOUNTER — Ambulatory Visit (INDEPENDENT_AMBULATORY_CARE_PROVIDER_SITE_OTHER): Payer: Medicare Other | Admitting: Podiatry

## 2017-07-07 ENCOUNTER — Encounter: Payer: Self-pay | Admitting: Podiatry

## 2017-07-07 DIAGNOSIS — M76822 Posterior tibial tendinitis, left leg: Secondary | ICD-10-CM

## 2017-07-07 DIAGNOSIS — Z9889 Other specified postprocedural states: Secondary | ICD-10-CM

## 2017-07-07 NOTE — Progress Notes (Signed)
She presents today with a follow-up for posterior tibial tendon repair and Kidner advance tendon date of surgery 05/27/2017 states that my foot feels fine but swells at times.  Objective: Vital signs are stable she is alert and oriented x3.  Pulses are palpable moderate edema to the left foot but minimal pain on palpation she has good range of motion though there is some tenderness on forced inversion.  She has good range of motion of the toes of the ankle joint just subtalar joint is a little bit limited.  Assessment: Slowly healing posterior tibial tendon  left.  Plan: I will let her start partial weightbearing today in her Cam walker.  Demonstrated this utilizing her crutches for her.  Though when she is leaving the office it does appear that she can just simply walk without any pain in the cam walker.  I want her to continue to wear the cam walker for the next 3 weeks and in 3 weeks she is to bring a shoe with her.  I placed her in a compression anklet today she is to wear that over the next 3 weeks as well.

## 2017-07-14 ENCOUNTER — Ambulatory Visit: Payer: Medicare Other | Admitting: Family Medicine

## 2017-07-14 ENCOUNTER — Ambulatory Visit: Payer: Medicare Other | Admitting: *Deleted

## 2017-08-11 ENCOUNTER — Encounter: Payer: Self-pay | Admitting: Podiatry

## 2017-08-11 ENCOUNTER — Ambulatory Visit (INDEPENDENT_AMBULATORY_CARE_PROVIDER_SITE_OTHER): Payer: Medicare Other | Admitting: Podiatry

## 2017-08-11 DIAGNOSIS — M76822 Posterior tibial tendinitis, left leg: Secondary | ICD-10-CM

## 2017-08-11 DIAGNOSIS — Z9889 Other specified postprocedural states: Secondary | ICD-10-CM

## 2017-08-13 NOTE — Progress Notes (Signed)
She presents today date of surgery May 27, 2017.  She is status post repair of the posterior tibial tendon with Kidner advance tendon repair states that I am having pain in the opposite side of the foot over here with swelling as she points to the lateral aspect of the left ankle.  Objective: Vital signs are stable alert and oriented x3 has no pain on palpation and range of motion of the posterior tibial tendon.  She does have tenderness on palpation and swelling along the lateral aspect of the foot does not appear to be any acute trauma.  Assessment: Well-healing posterior tibial tendon repair.  Plan: We will allow her to get back into her regular tennis shoe I will put her in a Tri-Lock brace however I will follow-up with her in about a month to 2 months.

## 2017-08-14 NOTE — Progress Notes (Signed)
  Subjective:  Patient ID: Ann Washington, female    DOB: January 15, 1951,  MRN: [id number]  Chief Complaint  Patient presents with  . Routine Post Op    dos 05.03.2019 Repair Posterior Tibial Tendon Lt; Kidner Advanced Tendon Lt; Cast Lt " doing well, my foot does not hurt"     DOS: 05/27/17 Procedure: Repair posterior tibial tendon left with Kidner procedure.  67 y.o. female returns for post-op check. Denies N/V/F/Ch. Pain is controlled with current medications.  Objective:   General AA&O x3. Normal mood and affect.  Vascular Foot warm and well perfused.  Neurologic Gross sensation intact.  Dermatologic Skin healing well without signs of infection. Skin edges well coapted without signs of infection.  Orthopedic: Tenderness to palpation noted about the surgical site.    Assessment & Plan:  Patient was evaluated and treated and all questions answered.  S/p foot surgery left -Progressing as expected post-operatively. -Sutures: intact. -Medications refilled: non -Cast changed and reapplied. -Foot redressed.  Return in about 2 weeks (around 06/23/2017) for hyatt patient, cast removal.

## 2017-08-19 ENCOUNTER — Ambulatory Visit: Payer: Medicare Other

## 2017-08-19 ENCOUNTER — Ambulatory Visit: Payer: Medicare Other | Admitting: Family Medicine

## 2017-08-25 DIAGNOSIS — D2261 Melanocytic nevi of right upper limb, including shoulder: Secondary | ICD-10-CM | POA: Diagnosis not present

## 2017-08-25 DIAGNOSIS — D225 Melanocytic nevi of trunk: Secondary | ICD-10-CM | POA: Diagnosis not present

## 2017-08-25 DIAGNOSIS — L814 Other melanin hyperpigmentation: Secondary | ICD-10-CM | POA: Diagnosis not present

## 2017-08-25 DIAGNOSIS — L821 Other seborrheic keratosis: Secondary | ICD-10-CM | POA: Diagnosis not present

## 2017-08-25 DIAGNOSIS — D2262 Melanocytic nevi of left upper limb, including shoulder: Secondary | ICD-10-CM | POA: Diagnosis not present

## 2017-08-30 ENCOUNTER — Ambulatory Visit (INDEPENDENT_AMBULATORY_CARE_PROVIDER_SITE_OTHER): Payer: Medicare Other

## 2017-08-30 ENCOUNTER — Ambulatory Visit (INDEPENDENT_AMBULATORY_CARE_PROVIDER_SITE_OTHER): Payer: Medicare Other | Admitting: Podiatry

## 2017-08-30 ENCOUNTER — Encounter: Payer: Self-pay | Admitting: Podiatry

## 2017-08-30 DIAGNOSIS — M76822 Posterior tibial tendinitis, left leg: Secondary | ICD-10-CM

## 2017-08-30 DIAGNOSIS — Z9889 Other specified postprocedural states: Secondary | ICD-10-CM

## 2017-08-30 MED ORDER — CYCLOBENZAPRINE HCL 10 MG PO TABS: 10.0000 mg | ORAL_TABLET | Freq: Three times a day (TID) | ORAL | 0 refills | Status: DC | PRN

## 2017-08-30 MED ORDER — CELECOXIB 200 MG PO CAPS: 200.0000 mg | ORAL_CAPSULE | Freq: Every day | ORAL | 2 refills | Status: DC

## 2017-08-31 ENCOUNTER — Telehealth: Payer: Self-pay | Admitting: *Deleted

## 2017-08-31 DIAGNOSIS — Z9889 Other specified postprocedural states: Secondary | ICD-10-CM

## 2017-08-31 DIAGNOSIS — M76822 Posterior tibial tendinitis, left leg: Secondary | ICD-10-CM

## 2017-08-31 NOTE — Progress Notes (Signed)
She presents today date of surgery May 27, 2017.  She is status post posterior tibial tendon repair with Kidner advance tendon and a cast.  She states that is hurting on the outside of my foot at the surgical site hurts occasionally but not nearly as bad as it did before surgery but the left side feels like a nerve pain is just like a shooting pain is up into my leg and is tight and sore and it hurts.  Objective: Vital signs are stable she is alert and oriented x3.  Pulses are palpable.  Her peroneal tendons are in spasm posterior tibial tendon is functional radiographs do not demonstrate any type of osseous abnormalities.  Assessment peroneal tendon spasm more than likely due to compensation.  Plan: Told her to get back into her cam walker until she can set up an appointment for physical therapy and I started her on cyclobenzaprine and will continue Celebrex.  I will follow-up with her once physical therapy is complete

## 2017-08-31 NOTE — Telephone Encounter (Signed)
Faxed required form, and demographics to [facility] as recommended by their sister facility on [address]. Left message informing pt of their change of address.

## 2017-08-31 NOTE — Telephone Encounter (Signed)
-----   Message from Rip Har[place], [facility] sent at 08/30/2017  4:52 PM EDT ----- Regarding: Referral to [place] Patient request therapist Mikki Santee  Duration: 3 x week x 4 weeks  S/p posterior tibial tendonitis left

## 2017-09-07 DIAGNOSIS — R531 Weakness: Secondary | ICD-10-CM | POA: Diagnosis not present

## 2017-09-07 DIAGNOSIS — M76822 Posterior tibial tendinitis, left leg: Secondary | ICD-10-CM | POA: Diagnosis not present

## 2017-09-07 DIAGNOSIS — R2689 Other abnormalities of gait and mobility: Secondary | ICD-10-CM | POA: Diagnosis not present

## 2017-09-07 DIAGNOSIS — M25572 Pain in left ankle and joints of left foot: Secondary | ICD-10-CM | POA: Diagnosis not present

## 2017-09-07 DIAGNOSIS — Z9889 Other specified postprocedural states: Secondary | ICD-10-CM | POA: Diagnosis not present

## 2017-09-08 ENCOUNTER — Encounter: Payer: Medicare Other | Admitting: Podiatry

## 2017-09-12 ENCOUNTER — Other Ambulatory Visit: Payer: Self-pay | Admitting: Family Medicine

## 2017-09-12 NOTE — Telephone Encounter (Signed)
Pt last seen 02/2017. Has an appt on 8/21 with [facility]. Last fill 04/18/2017 # 30 with 5 refills.

## 2017-09-13 DIAGNOSIS — Z9889 Other specified postprocedural states: Secondary | ICD-10-CM | POA: Diagnosis not present

## 2017-09-13 DIAGNOSIS — M25572 Pain in left ankle and joints of left foot: Secondary | ICD-10-CM | POA: Diagnosis not present

## 2017-09-13 DIAGNOSIS — M76822 Posterior tibial tendinitis, left leg: Secondary | ICD-10-CM | POA: Diagnosis not present

## 2017-09-13 DIAGNOSIS — R531 Weakness: Secondary | ICD-10-CM | POA: Diagnosis not present

## 2017-09-13 DIAGNOSIS — R2689 Other abnormalities of gait and mobility: Secondary | ICD-10-CM | POA: Diagnosis not present

## 2017-09-14 ENCOUNTER — Ambulatory Visit (INDEPENDENT_AMBULATORY_CARE_PROVIDER_SITE_OTHER): Payer: Medicare Other

## 2017-09-14 VITALS — BP 138/80 | HR 64 | Ht 62.5 in | Wt 179.2 lb

## 2017-09-14 DIAGNOSIS — Z0000 Encounter for general adult medical examination without abnormal findings: Secondary | ICD-10-CM | POA: Diagnosis not present

## 2017-09-14 NOTE — Progress Notes (Signed)
Subjective:   Ann Washington is a 67 y.o. female who presents for Medicare Annual (Subsequent) preventive examination.  Review of Systems:  No ROS.  Medicare Wellness Visit. Additional risk factors are reflected in the social history. Cardiac Risk Factors include: advanced age (>63men, >102 women) Patient lives with husband in a single story home. No pets. They manage all home care themselves. She still works 15 hours a week. Enjoys gardening, hanging out with grandkids, walking. Members at [facility] in [place].     Patient normally goes to bed around 10:30. Normally gets up around 6 am. Normally sleeps through the night but occasionally gets up to go to the bathroom. Uses Ambien to help with sleep. Does not use a CPAP Objective:     Vitals: BP 138/80 (BP Location: Left Arm, Patient Position: Sitting, Cuff Size: Large)   Pulse 64   Ht 5' 2.5" (1.588 m)   Wt 179 lb 3.2 oz (81.3 kg)   SpO2 94%   BMI 32.25 kg/m   Body mass index is 32.25 kg/m.  Advanced Directives 09/14/2017 04/01/2016  Does Patient Have a Medical Advance Directive? No No  Would patient like information on creating a medical advance directive? No - Patient declined -    Tobacco Social History   Tobacco Use  Smoking Status Never Smoker  Smokeless Tobacco Never Used     Counseling given: Not Answered   Past Medical History:  Diagnosis Date  . CIN I (cervical intraepithelial neoplasia I)    around age 67  . Depression   . 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 GERD (gastroesophageal reflux disease)   . Glaucoma   . Hyperlipidemia   . Hypertension   . Snoring 03/08/2008   Tested 2007 per patient-unrevealing for sleep apnea     Past Surgical History:  Procedure Laterality Date  . COLONOSCOPY    . COLPOSCOPY     age 1  . GYNECOLOGIC CRYOSURGERY     age 17  . HAMMER TOE SURGERY    . TUBAL LIGATION     Family History  Problem Relation Age of  Onset  . Diabetes Sister   . Hypertension Sister   . Alzheimer's disease Mother   . Hip fracture Mother   . Osteoporosis Mother   . Deep vein thrombosis Father   . Heart disease Father        MI 75s, nonsmoker  . Hypertension Brother   . Diabetes Maternal Grandmother   . Stroke Maternal Grandfather   . Depression Paternal Grandmother   . Suicidality Paternal Grandfather   . Depression Paternal Grandfather   . Breast cancer Sister   . Arthritis Sister   . Colon cancer Neg Hx    Social History   Socioeconomic History  . Marital status: Married    Spouse name: Not on file  . Number of children: Not on file  . Years of education: Not on file  . Highest education level: Not on file  Occupational History  . Not on file  Social Needs  . Financial resource strain: Not on file  . Food insecurity:    Worry: Not on file    Inability: Not on file  . Transportation needs:    Medical: Not on file    Non-medical: Not on file  Tobacco Use  . Smoking status: Never Smoker  . Smokeless tobacco: Never Used  Substance and Sexual Activity  .  Alcohol use: No  . Drug use: No  . Sexual activity: Yes    Birth control/protection: Surgical  Lifestyle  . Physical activity:    Days per week: Not on file    Minutes per session: Not on file  . Stress: Not on file  Relationships  . Social connections:    Talks on phone: Not on file    Gets together: Not on file    Attends religious service: Not on file    Active member of club or organization: Not on file    Attends meetings of clubs or organizations: Not on file    Relationship status: Not on file  Other Topics Concern  . Not on file  Social History Narrative   Married (husband patient at [facility]). 3 children. 7 grandkids.       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       Hobbies: time with grandkids, walk when weather nice, reading, tv    Outpatient Encounter Medications as of 09/14/2017  Medication Sig  .  amLODipine-valsartan (EXFORGE) 5-320 MG tablet Take 1 tablet by mouth daily.  . Calcium Carbonate-Vit D-Min 600-400 MG-UNIT TABS Take by mouth daily.    . celecoxib (CELEBREX) 200 MG capsule Take 1 capsule (200 mg total) by mouth daily.  Marland Kitchen escitalopram (LEXAPRO) 20 MG tablet Take 0.5 tablets (10 mg total) by mouth daily.  . Multiple Vitamin (MULTIVITAMIN) tablet Take 1 tablet by mouth daily.    Marland Kitchen omeprazole (PRILOSEC) 20 MG capsule Take 20 mg by mouth daily.  . Red Yeast Rice Extract (RED YEAST RICE PO) Take 2 tablets by mouth at bedtime.  . timolol (TIMOPTIC) 0.5 % ophthalmic solution Place 1 drop into both eyes 2 (two) times daily.  . travoprost, benzalkonium, (TRAVATAN) 0.004 % ophthalmic solution Place 1 drop into both eyes at bedtime.    Marland Kitchen zolpidem (AMBIEN) 5 MG tablet TAKE 1 TABLET BY MOUTH AT BEDTIME  . [DISCONTINUED] cyclobenzaprine (FLEXERIL) 10 MG tablet Take 1 tablet (10 mg total) by mouth 3 (three) times daily as needed for muscle spasms.   No facility-administered encounter medications on file as of 09/14/2017.     Activities of Daily Living In your present state of health, do you have any difficulty performing the following activities: 09/14/2017  Hearing? N  Vision? N  Difficulty concentrating or making decisions? N  Walking or climbing stairs? N  Dressing or bathing? N  Doing errands, shopping? N  Preparing Food and eating ? N  Using the Toilet? N  In the past six months, have you accidently leaked urine? N  Do you have problems with loss of bowel control? N  Managing your Medications? N  Managing your Finances? N  Housekeeping or managing your Housekeeping? N  Some recent data might be hidden    Patient Care Team: Marin Olp, MD as PCP - General (Family Medicine) Paula Compton, MD as Consulting Physician (Obstetrics and Gynecology) Rolm Bookbinder, MD as Consulting Physician (Dermatology)    Assessment:   This is a routine wellness examination for  Ann Washington.  Exercise Activities and Dietary recommendations Current Exercise Habits: Structured exercise class, Type of exercise: treadmill;walking, Time (Minutes): 25, Frequency (Times/Week): 3, Weekly Exercise (Minutes/Week): 75, Intensity: Mild, Exercise limited by: orthopedic condition(s)(Recently had foot surgery, still healing)  Breakfast: Peanut butter crackers with coffee (just creamer)  Lunch: Half a sandwich with water to drink  Dinner: [employer], drank water Goals    .  Exercise 150 minutes per week (moderate activity)     Will bump up to 5 days at [employer] Will bike or walk in between the arch trainer     . Weight (lb) < 170 lb (77.1 kg)     Check out  online nutrition programs as GumSearch.nl and http://vang.com/;  Calorie king.com  Look for foods with "whole" wheat; bran; oatmeal etc Shot at the farmer's markets in season for fresher choices  Watch for "hydrogenated" on the label of oils which are trans-fats.  Watch for "high fructose corn syrup" in snacks, yogurt or ketchup  Meats have less marbling; bright colored fruits and vegetables;  Canned; dump out liquid and wash vegetables. Be mindful of what we are eating  Portion control is essential to a health weight! Sit down; take a break and enjoy your meal; take smaller bites; put the fork down between bites;  It takes 20 minutes to get full; so check in with your fullness cues and stop eating when you start to fill full              Fall Risk Fall Risk  09/14/2017 04/01/2016 03/13/2015 03/08/2014  Falls in the past year? No No No No      Depression Screen PHQ 2/9 Scores 09/14/2017 03/18/2017 04/01/2016 03/13/2015  PHQ - 2 Score 0 0 0 0  PHQ- 9 Score 0 0 - -     Cognitive Function MMSE - Mini Mental State Exam 04/01/2016  Not completed: (No Data)     6CIT Screen 09/14/2017  What Year? 0 points  What month? 0 points  What time? 0 points  Count back from 20 0 points  Months in  reverse 0 points    Immunization History  Administered Date(s) Administered  . Influenza,inj,Quad PF,6+ Mos 11/15/2012, 10/30/2015  . Influenza-Unspecified 09/25/2013, 11/10/2014, 10/29/2016  . Pneumococcal Conjugate-13 03/08/2014  . Pneumococcal Polysaccharide-23 03/13/2015  . Td 01/26/2003  . Tdap 10/16/2015  . Zoster 12/12/2009      Screening Tests Health Maintenance  Topic Date Due  . INFLUENZA VACCINE  08/25/2017  . MAMMOGRAM  10/15/2017  . COLONOSCOPY  08/10/2022  . TETANUS/TDAP  10/15/2025  . DEXA SCAN  Completed  . Hepatitis C Screening  Completed  . PNA vac Low Risk Adult  Completed      Plan:    Follow Up with PCP as advised   I have personally reviewed and noted the following in the patient's chart:   . Medical and social history . Use of alcohol, tobacco or illicit drugs  . Current medications and supplements . Functional ability and status . Nutritional status . Physical activity . Advanced directives . List of other physicians . Vitals . Screenings to include cognitive, depression, and falls . Referrals and appointments  In addition, I have reviewed and discussed with patient certain preventive protocols, quality metrics, and best practice recommendations. A written personalized care plan for preventive services as well as general preventive health recommendations were provided to patient.     [place], [place]  4/40/1027

## 2017-09-14 NOTE — Patient Instructions (Addendum)
Ms. Driggs , Thank you for taking time to come for your Medicare Wellness Visit. I appreciate your ongoing commitment to your health goals. Please review the following plan we discussed and let me know if I can assist you in the future.   These are the goals we discussed: Goals    . Exercise 150 minutes per week (moderate activity)     Will bump up to 5 days at [employer] Will bike or walk in between the arch trainer     . Weight (lb) < 170 lb (77.1 kg)     Check out  online nutrition programs as GumSearch.nl and http://vang.com/;  Calorie king.com  Look for foods with "whole" wheat; bran; oatmeal etc Shot at the farmer's markets in season for fresher choices  Watch for "hydrogenated" on the label of oils which are trans-fats.  Watch for "high fructose corn syrup" in snacks, yogurt or ketchup  Meats have less marbling; bright colored fruits and vegetables;  Canned; dump out liquid and wash vegetables. Be mindful of what we are eating  Portion control is essential to a health weight! Sit down; take a break and enjoy your meal; take smaller bites; put the fork down between bites;  It takes 20 minutes to get full; so check in with your fullness cues and stop eating when you start to fill full              This is a list of the screening recommended for you and due dates:  Health Maintenance  Topic Date Due  . Flu Shot  08/25/2017  . Mammogram  10/15/2017  . Colon Cancer Screening  08/10/2022  . Tetanus Vaccine  10/15/2025  . DEXA scan (bone density measurement)  Completed  .  Hepatitis C: One time screening is recommended by Center for Disease Control  (CDC) for  adults born from [birth year] through 1967.   Completed  . Pneumonia vaccines  Completed    Preventive Care for Adults  A healthy lifestyle and preventive care can promote health and wellness. Preventive health guidelines for adults include the following key practices.  . A routine yearly physical is a good way  to check with your health care provider about your health and preventive screening. It is a chance to share any concerns and updates on your health and to receive a thorough exam.  . Visit your dentist for a routine exam and preventive care every 6 months. Brush your teeth twice a day and floss once a day. Good oral hygiene prevents tooth decay and gum disease.  . The frequency of eye exams is based on your age, health, family medical history, use  of contact lenses, and other factors. Follow your health care provider's recommendations for frequency of eye exams.  . Eat a healthy diet. Foods like vegetables, fruits, whole grains, low-fat dairy products, and lean protein foods contain the nutrients you need without too many calories. Decrease your intake of foods high in solid fats, added sugars, and salt. Eat the right amount of calories for you. Get information about a proper diet from your health care provider, if necessary.  . Regular physical exercise is one of the most important things you can do for your health. Most adults should get at least 150 minutes of moderate-intensity exercise (any activity that increases your heart rate and causes you to sweat) each week. In addition, most adults need muscle-strengthening exercises on 2 or more days a week.  Silver Sneakers may be  a benefit available to you. To determine eligibility, you may visit the website: www.silversneakers.com or contact program at 480-844-7885 Mon-Fri between 8AM-8PM.   . Maintain a healthy weight. The body mass index (BMI) is a screening tool to identify possible weight problems. It provides an estimate of body fat based on height and weight. Your health care provider can find your BMI and can help you achieve or maintain a healthy weight.   For adults 20 years and older: ? A BMI below 18.5 is considered underweight. ? A BMI of 18.5 to 24.9 is normal. ? A BMI of 25 to 29.9 is considered overweight. ? A BMI of 30 and above  is considered obese.   . Maintain normal blood lipids and cholesterol levels by exercising and minimizing your intake of saturated fat. Eat a balanced diet with plenty of fruit and vegetables. Blood tests for lipids and cholesterol should begin at age 66 and be repeated every 5 years. If your lipid or cholesterol levels are high, you are over 50, or you are at high risk for heart disease, you may need your cholesterol levels checked more frequently. Ongoing high lipid and cholesterol levels should be treated with medicines if diet and exercise are not working.  . If you smoke, find out from your health care provider how to quit. If you do not use tobacco, please do not start.  . If you choose to drink alcohol, please do not consume more than 2 drinks per day. One drink is considered to be 12 ounces (355 mL) of beer, 5 ounces (148 mL) of wine, or 1.5 ounces (44 mL) of liquor.  . If you are 55-81 years old, ask your health care provider if you should take aspirin to prevent strokes.  . Use sunscreen. Apply sunscreen liberally and repeatedly throughout the day. You should seek shade when your shadow is shorter than you. Protect yourself by wearing long sleeves, pants, a wide-brimmed hat, and sunglasses year round, whenever you are outdoors.  . Once a month, do a whole body skin exam, using a mirror to look at the skin on your back. Tell your health care provider of new moles, moles that have irregular borders, moles that are larger than a pencil eraser, or moles that have changed in shape or color.

## 2017-09-14 NOTE — Progress Notes (Signed)
PCP notes: From [facility] 03/18/17: see me in 4-6 months   Health maintenance: Flu shot scheduled for this Fall   Abnormal screenings: None   Patient concerns: None   Nurse concerns:None   Next PCP appt: 09/15/2017

## 2017-09-14 NOTE — Progress Notes (Signed)
I have reviewed and agree with note, evaluation, plan.   Stephen Hunter, MD  

## 2017-09-15 ENCOUNTER — Ambulatory Visit (INDEPENDENT_AMBULATORY_CARE_PROVIDER_SITE_OTHER): Payer: Medicare Other | Admitting: Family Medicine

## 2017-09-15 ENCOUNTER — Encounter: Payer: Self-pay | Admitting: Family Medicine

## 2017-09-15 VITALS — BP 122/80 | HR 66 | Temp 98.3°F | Ht 62.5 in | Wt 179.0 lb

## 2017-09-15 DIAGNOSIS — M25572 Pain in left ankle and joints of left foot: Secondary | ICD-10-CM | POA: Diagnosis not present

## 2017-09-15 DIAGNOSIS — I10 Essential (primary) hypertension: Secondary | ICD-10-CM | POA: Diagnosis not present

## 2017-09-15 DIAGNOSIS — F3342 Major depressive disorder, recurrent, in full remission: Secondary | ICD-10-CM | POA: Diagnosis not present

## 2017-09-15 DIAGNOSIS — Z9889 Other specified postprocedural states: Secondary | ICD-10-CM | POA: Diagnosis not present

## 2017-09-15 DIAGNOSIS — K219 Gastro-esophageal reflux disease without esophagitis: Secondary | ICD-10-CM

## 2017-09-15 DIAGNOSIS — R531 Weakness: Secondary | ICD-10-CM | POA: Diagnosis not present

## 2017-09-15 DIAGNOSIS — E785 Hyperlipidemia, unspecified: Secondary | ICD-10-CM

## 2017-09-15 DIAGNOSIS — Z79899 Other long term (current) drug therapy: Secondary | ICD-10-CM | POA: Diagnosis not present

## 2017-09-15 DIAGNOSIS — R739 Hyperglycemia, unspecified: Secondary | ICD-10-CM

## 2017-09-15 DIAGNOSIS — R2689 Other abnormalities of gait and mobility: Secondary | ICD-10-CM | POA: Diagnosis not present

## 2017-09-15 DIAGNOSIS — M76822 Posterior tibial tendinitis, left leg: Secondary | ICD-10-CM | POA: Diagnosis not present

## 2017-09-15 LAB — COMPREHENSIVE METABOLIC PANEL
ALK PHOS: 60 U/L (ref 39–117)
ALT: 16 U/L (ref 0–35)
AST: 18 U/L (ref 0–37)
Albumin: 4.5 g/dL (ref 3.5–5.2)
BILIRUBIN TOTAL: 0.8 mg/dL (ref 0.2–1.2)
BUN: 23 mg/dL (ref 6–23)
CALCIUM: 10.2 mg/dL (ref 8.4–10.5)
CO2: 27 mEq/L (ref 19–32)
CREATININE: 0.89 mg/dL (ref 0.40–1.20)
Chloride: 103 mEq/L (ref 96–112)
GFR: 66.9 mL/min (ref >60.00)
Glucose, Bld: 108 mg/dL — ABNORMAL HIGH (ref 70–99)
Potassium: 4 mEq/L (ref 3.5–5.1)
Sodium: 136 mEq/L (ref 135–145)
TOTAL PROTEIN: 8.1 g/dL (ref 6.0–8.3)

## 2017-09-15 LAB — CBC WITH DIFFERENTIAL/PLATELET
BASOS ABS: 0 10*3/uL (ref 0.0–0.1)
Basophils Relative: 0.6 % (ref 0.0–3.0)
EOS ABS: 0.4 10*3/uL (ref 0.0–0.7)
Eosinophils Relative: 5.8 % — ABNORMAL HIGH (ref 0.0–5.0)
HEMATOCRIT: 39.7 % (ref 36.0–46.0)
Hemoglobin: 13.4 g/dL (ref 12.0–15.0)
LYMPHS PCT: 31.1 % (ref 12.0–46.0)
Lymphs Abs: 2.3 10*3/uL (ref 0.7–4.0)
MCHC: 33.7 g/dL (ref 30.0–36.0)
MCV: 90.9 fl (ref 78.0–100.0)
MONOS PCT: 9.1 % (ref 3.0–12.0)
Monocytes Absolute: 0.7 10*3/uL (ref 0.1–1.0)
Neutro Abs: 3.9 10*3/uL (ref 1.4–7.7)
Neutrophils Relative %: 53.4 % (ref 43.0–77.0)
Platelets: 250 10*3/uL (ref 150.0–400.0)
RBC: 4.37 Mil/uL (ref 3.87–5.11)
RDW: 12.9 % (ref 11.5–15.5)
WBC: 7.3 10*3/uL (ref 4.0–10.5)

## 2017-09-15 LAB — VITAMIN B12: Vitamin B-12: 743 pg/mL (ref 211–911)

## 2017-09-15 LAB — LIPID PANEL
CHOL/HDL RATIO: 5
Cholesterol: 243 mg/dL — ABNORMAL HIGH (ref 0–200)
HDL: 50.5 mg/dL (ref >39.00)
LDL Cholesterol: 168 mg/dL — ABNORMAL HIGH (ref 0–99)
NONHDL: 192.83
TRIGLYCERIDES: 125 mg/dL (ref 0.0–149.0)
VLDL: 25 mg/dL (ref 0.0–40.0)

## 2017-09-15 LAB — HEMOGLOBIN A1C: Hgb A1c MFr Bld: 6.1 % (ref 4.6–6.5)

## 2017-09-15 NOTE — Progress Notes (Signed)
Your CBC was normal (blood counts, infection fighting cells, platelets). Your CMET was normal (kidney, liver, and electrolytes, blood sugar). If you were fasting which I believe you were- At risk for diabetes with blood sugar 108 (at risk from 100-125). At risk for diabetes with hemoglobin a1c of 6.1 up from 5.9  (at risk from 5.7-6.4). Healthy eating, regular exercise, weight loss advised.   Your cholesterol has really jumped up- bad cholesterol is at highest levels in at least 4 years. Goal under 100 and you were at 168. I would recommend [place] at least try atorvastatin 20mg  once a week if you are unable to get bad cholesterol down within 6 months to under 130. Lets consider a repeat LDL at that time.   Your B12 looks great.

## 2017-09-15 NOTE — Assessment & Plan Note (Signed)
Long term PPI use- no b12 on file- will update . Bad reflux when doesn't take PPI. We discussed trial of zantac 150mg  twice a day instead of prilosec to try to protect kidneys

## 2017-09-15 NOTE — Progress Notes (Signed)
Subjective:  Ann Washington is a 67 y.o. year old very pleasant female patient who presents for/with See problem oriented charting ROS- some edeman and ankle pain (seeing podiatry). No chest pain or shortness of breath. No headache or blurry vision.    Past Medical History-  Patient Active Problem List   Diagnosis Date Noted  . CKD (chronic kidney disease), stage III ([place]) 03/14/2014    Priority: Medium  . Insomnia 03/08/2014    Priority: Medium  . Hyperglycemia 12/09/2009    Priority: Medium  . Hyperlipidemia 10/13/2006    Priority: Medium  . Major depression, recurrent, full remission ([place]) 10/13/2006    Priority: Medium  . Hypertension, essential 10/10/2006    Priority: Medium  . Normocytic anemia 09/12/2014    Priority: Low  . Glaucoma 03/08/2014    Priority: Low  . Stress incontinence, female 10/12/2011    Priority: Low  . Vaginal atrophy 05/27/2011    Priority: Low  . Obesity 10/11/2008    Priority: Low  . GERD 08/25/2007    Priority: Low    Medications- reviewed and updated Current Outpatient Medications  Medication Sig Dispense Refill  . amLODipine-valsartan (EXFORGE) 5-320 MG tablet Take 1 tablet by mouth daily. 90 tablet 3  . Calcium Carbonate-Vit D-Min 600-400 MG-UNIT TABS Take by mouth daily.      . celecoxib (CELEBREX) 200 MG capsule Take 1 capsule (200 mg total) by mouth daily. 30 capsule 2  . escitalopram (LEXAPRO) 20 MG tablet Take 0.5 tablets (10 mg total) by mouth daily. 15 tablet 5  . Multiple Vitamin (MULTIVITAMIN) tablet Take 1 tablet by mouth daily.      Marland Kitchen omeprazole (PRILOSEC) 20 MG capsule Take 20 mg by mouth daily.    . Red Yeast Rice Extract (RED YEAST RICE PO) Take 2 tablets by mouth at bedtime.    . timolol (TIMOPTIC) 0.5 % ophthalmic solution Place 1 drop into both eyes 2 (two) times daily.  0  . travoprost, benzalkonium, (TRAVATAN) 0.004 % ophthalmic solution Place 1 drop into both eyes at bedtime.      Marland Kitchen zolpidem (AMBIEN) 5 MG tablet TAKE  1 TABLET BY MOUTH AT BEDTIME 30 tablet 4   No current facility-administered medications for this visit.     Objective: BP 122/80 (BP Location: Left Arm, Patient Position: Sitting, Cuff Size: Large)   Pulse 66   Temp 98.3 F (36.8 C) (Oral)   Ht 5' 2.5" (1.588 m)   Wt 179 lb (81.2 kg)   SpO2 97%   BMI 32.22 kg/m  Gen: NAD, resting comfortably CV: RRR no murmurs rubs or gallops Lungs: CTAB no crackles, wheeze, rhonchi Abdomen: soft/nontender/nondistended/normal bowel sounds. No rebound or guarding.  Ext: no edema on right, trace on left ankle Skin: warm, dry Neuro: grossly normal, moves all extremities  Assessment/Plan:  Other notes: 1.insomnia-   Ok to refill ambien 2. Long term PPI use- no b12 on file- will update . Bad reflux when doesn't take PPI. We discussed trial of zantac 150mg  twice a day instead of prilosec to try to protect kidneys 3. Has had bone density, mammogram, and pap through GYN- we will get records 4. Colonoscopy 2014 with 10 year repeat planned 5. Never smoker  Social History   Social History Narrative   Married (husband patient at [facility]). 3 children. 7 grandkids.       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 She had myalgias on pravastatin Lab Results  Component Value Date   CHOL 256 (H) 10/01/2016   HDL 49.10 10/01/2016   LDLCALC 126 (H) 04/01/2016   LDLDIRECT 166.0 03/18/2017   TRIG 106.0 04/01/2016   CHOLHDL 4 04/01/2016   A/P: update at least LDL today  GERD Long term PPI use- no b12 on file- will update .  Bad reflux when doesn't take PPI. We discussed trial of zantac 150mg  twice a day instead of prilosec to try to protect kidneys  Major depression, recurrent, full remission ([place]) S: well controlled with PHQ9 of 0 on lexapro 10mg  A/P: continue current rx  Hypertension, essential CKD III S: controlled on amlodipine- valsartan 5-320mg . Also has known CKD III- knows to avoid nsaids ( I warned her about the celebrex through Dr. Milinda Pointer and how that can affect kidneys). She states she is using the celebrex daily- advised against.  Valsartan in case proteinuric element.  BP Readings from Last 3 Encounters:  09/15/17 122/80  09/14/17 138/80  06/23/17 122/78  A/P: We discussed blood pressure goal of <140/90. Continue current meds:  But did advise to contact podiatry and suggested coming off celebrex    Future Appointments  Date Time Provider [place]  10/05/2018  9:00 AM [facility]   No follow-ups on file.  Lab/Order associations: Hyperlipidemia, unspecified hyperlipidemia type - Plan: CBC with Differential/Platelet, Comprehensive metabolic panel, Lipid panel  Hypertension, essential - Plan: CBC with Differential/Platelet, Comprehensive metabolic panel, Lipid panel  Hyperglycemia - Plan: Hemoglobin A1c  Gastroesophageal reflux disease without esophagitis  Major depression, recurrent, full remission ([facility])  High risk medication use - Plan: Vitamin B12  Return precautions advised.  Garret Reddish, MD

## 2017-09-15 NOTE — Assessment & Plan Note (Signed)
S: well controlled with PHQ9 of 0 on lexapro 10mg  A/P: continue current rx

## 2017-09-15 NOTE — Patient Instructions (Addendum)
We discussed trial of zantac 150mg  twice a day instead of prilosec to try to protect kidneys  contact podiatry to let them know you have stage III kidney disease- id love if the celebrex could be a short term plan  I like your plan of drinking more water- that's good for the kidneys and cutting down on tea is good for your weight efforts- great job staying stable- lets set our goal of at least 5 lbs down by next visit  Please stop by lab before you go

## 2017-09-15 NOTE — Assessment & Plan Note (Signed)
S: have encouraged weight loss due to elevated DM risk- weight stable from february Lab Results  Component Value Date   HGBA1C 5.9 03/18/2017   HGBA1C 6.0 10/01/2016   HGBA1C 6.1 04/01/2016  A/P: update a1c with labs

## 2017-09-15 NOTE — Assessment & Plan Note (Signed)
CKD III S: controlled on amlodipine- valsartan 5-320mg . Also has known CKD III- knows to avoid nsaids ( I warned Ann Washington about the celebrex through Dr. Milinda Pointer and how that can affect kidneys). She states she is using the celebrex daily- advised against.  Valsartan in case proteinuric element.  BP Readings from Last 3 Encounters:  09/15/17 122/80  09/14/17 138/80  06/23/17 122/78  A/P: We discussed blood pressure goal of <140/90. Continue current meds:  But did advise to contact podiatry and suggested coming off celebrex

## 2017-09-15 NOTE — Assessment & Plan Note (Signed)
S: poorly controlled on red yeast rice on check about a year ago. She had myalgias on pravastatin Lab Results  Component Value Date   CHOL 256 (H) 10/01/2016   HDL 49.10 10/01/2016   LDLCALC 126 (H) 04/01/2016   LDLDIRECT 166.0 03/18/2017   TRIG 106.0 04/01/2016   CHOLHDL 4 04/01/2016   A/P: update at least LDL today

## 2017-09-16 DIAGNOSIS — Z9889 Other specified postprocedural states: Secondary | ICD-10-CM | POA: Diagnosis not present

## 2017-09-16 DIAGNOSIS — M76822 Posterior tibial tendinitis, left leg: Secondary | ICD-10-CM | POA: Diagnosis not present

## 2017-09-16 DIAGNOSIS — R531 Weakness: Secondary | ICD-10-CM | POA: Diagnosis not present

## 2017-09-16 DIAGNOSIS — M25572 Pain in left ankle and joints of left foot: Secondary | ICD-10-CM | POA: Diagnosis not present

## 2017-09-16 DIAGNOSIS — R2689 Other abnormalities of gait and mobility: Secondary | ICD-10-CM | POA: Diagnosis not present

## 2017-09-20 DIAGNOSIS — R531 Weakness: Secondary | ICD-10-CM | POA: Diagnosis not present

## 2017-09-20 DIAGNOSIS — M76822 Posterior tibial tendinitis, left leg: Secondary | ICD-10-CM | POA: Diagnosis not present

## 2017-09-20 DIAGNOSIS — R2689 Other abnormalities of gait and mobility: Secondary | ICD-10-CM | POA: Diagnosis not present

## 2017-09-20 DIAGNOSIS — Z9889 Other specified postprocedural states: Secondary | ICD-10-CM | POA: Diagnosis not present

## 2017-09-20 DIAGNOSIS — M25572 Pain in left ankle and joints of left foot: Secondary | ICD-10-CM | POA: Diagnosis not present

## 2017-09-22 DIAGNOSIS — M76822 Posterior tibial tendinitis, left leg: Secondary | ICD-10-CM | POA: Diagnosis not present

## 2017-09-22 DIAGNOSIS — M25572 Pain in left ankle and joints of left foot: Secondary | ICD-10-CM | POA: Diagnosis not present

## 2017-09-22 DIAGNOSIS — R531 Weakness: Secondary | ICD-10-CM | POA: Diagnosis not present

## 2017-09-22 DIAGNOSIS — Z9889 Other specified postprocedural states: Secondary | ICD-10-CM | POA: Diagnosis not present

## 2017-09-22 DIAGNOSIS — R2689 Other abnormalities of gait and mobility: Secondary | ICD-10-CM | POA: Diagnosis not present

## 2017-09-23 DIAGNOSIS — R2689 Other abnormalities of gait and mobility: Secondary | ICD-10-CM | POA: Diagnosis not present

## 2017-09-23 DIAGNOSIS — Z9889 Other specified postprocedural states: Secondary | ICD-10-CM | POA: Diagnosis not present

## 2017-09-23 DIAGNOSIS — R531 Weakness: Secondary | ICD-10-CM | POA: Diagnosis not present

## 2017-09-23 DIAGNOSIS — M76822 Posterior tibial tendinitis, left leg: Secondary | ICD-10-CM | POA: Diagnosis not present

## 2017-09-23 DIAGNOSIS — M25572 Pain in left ankle and joints of left foot: Secondary | ICD-10-CM | POA: Diagnosis not present

## 2017-09-27 DIAGNOSIS — R531 Weakness: Secondary | ICD-10-CM | POA: Diagnosis not present

## 2017-09-27 DIAGNOSIS — M76822 Posterior tibial tendinitis, left leg: Secondary | ICD-10-CM | POA: Diagnosis not present

## 2017-09-27 DIAGNOSIS — Z9889 Other specified postprocedural states: Secondary | ICD-10-CM | POA: Diagnosis not present

## 2017-09-27 DIAGNOSIS — R2689 Other abnormalities of gait and mobility: Secondary | ICD-10-CM | POA: Diagnosis not present

## 2017-09-27 DIAGNOSIS — M25572 Pain in left ankle and joints of left foot: Secondary | ICD-10-CM | POA: Diagnosis not present

## 2017-09-29 DIAGNOSIS — R531 Weakness: Secondary | ICD-10-CM | POA: Diagnosis not present

## 2017-09-29 DIAGNOSIS — M25572 Pain in left ankle and joints of left foot: Secondary | ICD-10-CM | POA: Diagnosis not present

## 2017-09-29 DIAGNOSIS — R2689 Other abnormalities of gait and mobility: Secondary | ICD-10-CM | POA: Diagnosis not present

## 2017-09-29 DIAGNOSIS — Z9889 Other specified postprocedural states: Secondary | ICD-10-CM | POA: Diagnosis not present

## 2017-09-29 DIAGNOSIS — M76822 Posterior tibial tendinitis, left leg: Secondary | ICD-10-CM | POA: Diagnosis not present

## 2017-09-30 DIAGNOSIS — Z9889 Other specified postprocedural states: Secondary | ICD-10-CM | POA: Diagnosis not present

## 2017-09-30 DIAGNOSIS — M25572 Pain in left ankle and joints of left foot: Secondary | ICD-10-CM | POA: Diagnosis not present

## 2017-09-30 DIAGNOSIS — M76822 Posterior tibial tendinitis, left leg: Secondary | ICD-10-CM | POA: Diagnosis not present

## 2017-09-30 DIAGNOSIS — R2689 Other abnormalities of gait and mobility: Secondary | ICD-10-CM | POA: Diagnosis not present

## 2017-09-30 DIAGNOSIS — R531 Weakness: Secondary | ICD-10-CM | POA: Diagnosis not present

## 2017-10-04 DIAGNOSIS — Z9889 Other specified postprocedural states: Secondary | ICD-10-CM | POA: Diagnosis not present

## 2017-10-04 DIAGNOSIS — M76822 Posterior tibial tendinitis, left leg: Secondary | ICD-10-CM | POA: Diagnosis not present

## 2017-10-04 DIAGNOSIS — R531 Weakness: Secondary | ICD-10-CM | POA: Diagnosis not present

## 2017-10-04 DIAGNOSIS — R2689 Other abnormalities of gait and mobility: Secondary | ICD-10-CM | POA: Diagnosis not present

## 2017-10-04 DIAGNOSIS — M25572 Pain in left ankle and joints of left foot: Secondary | ICD-10-CM | POA: Diagnosis not present

## 2017-10-06 ENCOUNTER — Encounter: Payer: Self-pay | Admitting: Podiatry

## 2017-10-06 ENCOUNTER — Ambulatory Visit (INDEPENDENT_AMBULATORY_CARE_PROVIDER_SITE_OTHER): Payer: Medicare Other | Admitting: Podiatry

## 2017-10-06 DIAGNOSIS — Z9889 Other specified postprocedural states: Secondary | ICD-10-CM

## 2017-10-06 DIAGNOSIS — M76822 Posterior tibial tendinitis, left leg: Secondary | ICD-10-CM | POA: Diagnosis not present

## 2017-10-06 NOTE — Progress Notes (Signed)
She presents today for follow-up of her peroneal spasm to her left lower extremity.  She is currently in physical therapy and states that seems to be doing better.  She states that is approximately 50% improved.  She is status post posterior tibial tendon repair May 27, 2017.  She states that is slowly getting better.  Denies fever chills nausea vomiting muscle aches pains calf pain back pain chest pain shortness of breath.  Objective: Vital signs are stable she is alert and oriented x3.  There is minimal edema no erythema cellulitis drainage or odor she still has some spasms in the peroneal tendons and muscles but appears to be reducing considerably.  Assessment: Well-healing surgical foot left peroneal spasms left.  Plan: Instructed her to continue physical therapy they are going to try dry needling.  I also recommended utilizing cyclobenzaprine to help with the spasms.

## 2017-10-07 DIAGNOSIS — R2689 Other abnormalities of gait and mobility: Secondary | ICD-10-CM | POA: Diagnosis not present

## 2017-10-07 DIAGNOSIS — M25572 Pain in left ankle and joints of left foot: Secondary | ICD-10-CM | POA: Diagnosis not present

## 2017-10-07 DIAGNOSIS — R531 Weakness: Secondary | ICD-10-CM | POA: Diagnosis not present

## 2017-10-07 DIAGNOSIS — Z9889 Other specified postprocedural states: Secondary | ICD-10-CM | POA: Diagnosis not present

## 2017-10-07 DIAGNOSIS — M76822 Posterior tibial tendinitis, left leg: Secondary | ICD-10-CM | POA: Diagnosis not present

## 2017-10-13 DIAGNOSIS — R531 Weakness: Secondary | ICD-10-CM | POA: Diagnosis not present

## 2017-10-13 DIAGNOSIS — R2689 Other abnormalities of gait and mobility: Secondary | ICD-10-CM | POA: Diagnosis not present

## 2017-10-13 DIAGNOSIS — M76822 Posterior tibial tendinitis, left leg: Secondary | ICD-10-CM | POA: Diagnosis not present

## 2017-10-13 DIAGNOSIS — Z9889 Other specified postprocedural states: Secondary | ICD-10-CM | POA: Diagnosis not present

## 2017-10-13 DIAGNOSIS — M25572 Pain in left ankle and joints of left foot: Secondary | ICD-10-CM | POA: Diagnosis not present

## 2017-10-14 DIAGNOSIS — Z9889 Other specified postprocedural states: Secondary | ICD-10-CM | POA: Diagnosis not present

## 2017-10-14 DIAGNOSIS — M76822 Posterior tibial tendinitis, left leg: Secondary | ICD-10-CM | POA: Diagnosis not present

## 2017-10-14 DIAGNOSIS — R531 Weakness: Secondary | ICD-10-CM | POA: Diagnosis not present

## 2017-10-14 DIAGNOSIS — R2689 Other abnormalities of gait and mobility: Secondary | ICD-10-CM | POA: Diagnosis not present

## 2017-10-14 DIAGNOSIS — M25572 Pain in left ankle and joints of left foot: Secondary | ICD-10-CM | POA: Diagnosis not present

## 2017-10-18 DIAGNOSIS — M25572 Pain in left ankle and joints of left foot: Secondary | ICD-10-CM | POA: Diagnosis not present

## 2017-10-18 DIAGNOSIS — Z9889 Other specified postprocedural states: Secondary | ICD-10-CM | POA: Diagnosis not present

## 2017-10-18 DIAGNOSIS — R2689 Other abnormalities of gait and mobility: Secondary | ICD-10-CM | POA: Diagnosis not present

## 2017-10-18 DIAGNOSIS — M76822 Posterior tibial tendinitis, left leg: Secondary | ICD-10-CM | POA: Diagnosis not present

## 2017-10-18 DIAGNOSIS — R531 Weakness: Secondary | ICD-10-CM | POA: Diagnosis not present

## 2017-10-21 DIAGNOSIS — R531 Weakness: Secondary | ICD-10-CM | POA: Diagnosis not present

## 2017-10-21 DIAGNOSIS — H2513 Age-related nuclear cataract, bilateral: Secondary | ICD-10-CM | POA: Diagnosis not present

## 2017-10-21 DIAGNOSIS — Z9889 Other specified postprocedural states: Secondary | ICD-10-CM | POA: Diagnosis not present

## 2017-10-21 DIAGNOSIS — H25043 Posterior subcapsular polar age-related cataract, bilateral: Secondary | ICD-10-CM | POA: Diagnosis not present

## 2017-10-21 DIAGNOSIS — R2689 Other abnormalities of gait and mobility: Secondary | ICD-10-CM | POA: Diagnosis not present

## 2017-10-21 DIAGNOSIS — H524 Presbyopia: Secondary | ICD-10-CM | POA: Diagnosis not present

## 2017-10-21 DIAGNOSIS — M25572 Pain in left ankle and joints of left foot: Secondary | ICD-10-CM | POA: Diagnosis not present

## 2017-10-21 DIAGNOSIS — M76822 Posterior tibial tendinitis, left leg: Secondary | ICD-10-CM | POA: Diagnosis not present

## 2017-10-21 DIAGNOSIS — H401132 Primary open-angle glaucoma, bilateral, moderate stage: Secondary | ICD-10-CM | POA: Diagnosis not present

## 2017-10-27 DIAGNOSIS — Z9889 Other specified postprocedural states: Secondary | ICD-10-CM | POA: Diagnosis not present

## 2017-10-27 DIAGNOSIS — R2689 Other abnormalities of gait and mobility: Secondary | ICD-10-CM | POA: Diagnosis not present

## 2017-10-27 DIAGNOSIS — R531 Weakness: Secondary | ICD-10-CM | POA: Diagnosis not present

## 2017-10-27 DIAGNOSIS — M76822 Posterior tibial tendinitis, left leg: Secondary | ICD-10-CM | POA: Diagnosis not present

## 2017-10-27 DIAGNOSIS — M25572 Pain in left ankle and joints of left foot: Secondary | ICD-10-CM | POA: Diagnosis not present

## 2017-11-02 DIAGNOSIS — Z23 Encounter for immunization: Secondary | ICD-10-CM | POA: Diagnosis not present

## 2017-11-09 ENCOUNTER — Other Ambulatory Visit: Payer: Self-pay | Admitting: Obstetrics and Gynecology

## 2017-11-09 DIAGNOSIS — Z1231 Encounter for screening mammogram for malignant neoplasm of breast: Secondary | ICD-10-CM

## 2017-11-10 ENCOUNTER — Ambulatory Visit (INDEPENDENT_AMBULATORY_CARE_PROVIDER_SITE_OTHER): Payer: Medicare Other | Admitting: Podiatry

## 2017-11-10 DIAGNOSIS — Z9889 Other specified postprocedural states: Secondary | ICD-10-CM

## 2017-11-10 DIAGNOSIS — M76822 Posterior tibial tendinitis, left leg: Secondary | ICD-10-CM

## 2017-11-10 NOTE — Progress Notes (Signed)
She presents today date of surgery 05/27/2017 status post repair posterior tibial tendon with Kidner advanced tendon left foot.  Since that time she has developed a peroneal spasm which is resulted in further weakening of her posterior tibial tendon repair.  She is been going to physical therapy and states that she is in a position at this point that she feels that she has had some improvement and cannot improve or benefit anymore from physical therapy.  Objective: Vital signs are stable she is alert and oriented x3 foot is much more rectus than previously noted.  She has good inversion against resistance still has tightness on the peroneal tendons but much improved from previous eval.  Assessment: Resolving peroneal spasm and repair of posterior tibial tendon.  This is well healing.  Plan: Follow-up with me in 2 months just for recheck otherwise as needed.

## 2018-01-09 DIAGNOSIS — Z23 Encounter for immunization: Secondary | ICD-10-CM | POA: Diagnosis not present

## 2018-01-10 ENCOUNTER — Ambulatory Visit: Payer: Medicare Other | Admitting: Podiatry

## 2018-01-12 DIAGNOSIS — H2511 Age-related nuclear cataract, right eye: Secondary | ICD-10-CM | POA: Diagnosis not present

## 2018-01-12 DIAGNOSIS — H401112 Primary open-angle glaucoma, right eye, moderate stage: Secondary | ICD-10-CM | POA: Diagnosis not present

## 2018-01-12 DIAGNOSIS — H25041 Posterior subcapsular polar age-related cataract, right eye: Secondary | ICD-10-CM | POA: Diagnosis not present

## 2018-01-12 DIAGNOSIS — H25811 Combined forms of age-related cataract, right eye: Secondary | ICD-10-CM | POA: Diagnosis not present

## 2018-02-09 ENCOUNTER — Other Ambulatory Visit: Payer: Self-pay | Admitting: Family Medicine

## 2018-02-09 DIAGNOSIS — H40039 Anatomical narrow angle, unspecified eye: Secondary | ICD-10-CM | POA: Diagnosis not present

## 2018-02-09 DIAGNOSIS — H2512 Age-related nuclear cataract, left eye: Secondary | ICD-10-CM | POA: Diagnosis not present

## 2018-02-09 DIAGNOSIS — H25819 Combined forms of age-related cataract, unspecified eye: Secondary | ICD-10-CM | POA: Diagnosis not present

## 2018-02-09 DIAGNOSIS — H401122 Primary open-angle glaucoma, left eye, moderate stage: Secondary | ICD-10-CM | POA: Diagnosis not present

## 2018-02-09 DIAGNOSIS — H25812 Combined forms of age-related cataract, left eye: Secondary | ICD-10-CM | POA: Diagnosis not present

## 2018-02-09 DIAGNOSIS — H25042 Posterior subcapsular polar age-related cataract, left eye: Secondary | ICD-10-CM | POA: Diagnosis not present

## 2018-02-20 ENCOUNTER — Other Ambulatory Visit: Payer: Self-pay | Admitting: Family Medicine

## 2018-02-28 ENCOUNTER — Ambulatory Visit (INDEPENDENT_AMBULATORY_CARE_PROVIDER_SITE_OTHER): Payer: Medicare Other | Admitting: Podiatry

## 2018-02-28 ENCOUNTER — Encounter: Payer: Self-pay | Admitting: Podiatry

## 2018-02-28 ENCOUNTER — Telehealth: Payer: Self-pay | Admitting: *Deleted

## 2018-02-28 DIAGNOSIS — M76822 Posterior tibial tendinitis, left leg: Secondary | ICD-10-CM

## 2018-02-28 DIAGNOSIS — Z9889 Other specified postprocedural states: Secondary | ICD-10-CM

## 2018-02-28 NOTE — Progress Notes (Signed)
She presents today date of surgery May 27, 2017 left foot.  She is status post Kidner procedure with repair of her posterior tibial tendon secondary to flatfoot deformity.  She states that she is still quite tender no longer medially but more so laterally as she points to the fourth fifth TMT area.  She also has pain along the peroneal tendons and along the lateral aspect of the leg overlying the peroneal muscles.  Objective: Vital signs are stable she is alert and oriented x3.  Pulses are palpable.  It seems that her peroneals and are in constant spasm or a contracture visualized by the tight peroneal tendons and the tightness in the lateral calf and the eversion of her foot.  Upon request she can relax the foot which we can then to invert at the subtalar joint to take tension off of the posterior tibial tendon which was fixed.  Since this correction she has had very little pain along the medial aspect of the foot.  Assessment well-healed surgical foot but she currently she is in peroneal spasm which is resulting in the lateral fourth fifth TMT capsulitis and pain.  I feel that also her period of time she will reinjure the posterior tibial tendon.  Plan: We discussed etiology pathology conservative surgical therapies at this point in time I feel it necessary to send her to neurology to Dr. Marvel Plan in hopes that she can inject the peroneal muscles with Botox to help alleviate the pull of that musculature on her foot.  I think over time this will allow her posterior tibial tendon to gain strength.

## 2018-02-28 NOTE — Telephone Encounter (Signed)
Faxed orders to [facility]. 

## 2018-02-28 NOTE — Telephone Encounter (Signed)
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 Narda Amber - [place]

## 2018-03-02 ENCOUNTER — Telehealth: Payer: Self-pay | Admitting: *Deleted

## 2018-03-02 NOTE — Addendum Note (Signed)
Addended by: Harriett Sine D on: 03/02/2018 03:07 PM   Modules accepted: Orders

## 2018-03-02 NOTE — Telephone Encounter (Signed)
-----   Message from Garrel Ridgel, [place] sent at 03/01/2018  5:03 PM EST ----- Val see the conversation with Dr. Posey Pronto and please do as she recommends.  Also inform the patient of this.  Thanks. ----- Message ----- From: Alda Berthold, DO Sent: 03/01/2018  12:59 PM EST To: Garrel Ridgel, DPM  Hi Dr. Milinda Pointer,  [facility] you are well.  Thank you for reaching out, however, after reviewing your notes, she is probably best evaluated by Dr. Tessa Lerner with PM&R at [facility] rehab.  He does botox to the lower limbs and can assist with symptom management and long term rehab for her.  I refer my lower limb spasticity to them for management also.    Thanks for keeping me in mind.  Donika  ----- Message ----- From: Garrel Ridgel, [place] Sent: 03/01/2018   8:06 AM EST To: Alda Berthold, DO  Dr. Posey Pronto I hope you will be able to help me with this.  I have a patient, Ann Washington, who has a peroneal spastic flat foot deformity left. She will be scheduled in the next day or so for an evaluation and treatment with you. Surgery was performed for a posterior tibial tendon tear in May of last year.  After coming out of the cast, the peroneal spasm was more obvious.  She was seen by PT for several weeks and was much improved but ultimately worsened once again and is now experiencing lateral foot pain at the 4th/5th TMT.  I fear this will ultimately result in another tear of the PT tendon and arthritis of the TMT.  I hope that you would be able to weaken the pull of the peroneals left with Botox or something similar.  I know of no other options at this point.    Thank you ,   Roselind Messier, DPM, FACFAS

## 2018-03-02 NOTE — Telephone Encounter (Signed)
Left message for pt to call for updated orders from Dr. Milinda Pointer.

## 2018-03-02 NOTE — Telephone Encounter (Signed)
Entered in error

## 2018-03-02 NOTE — Telephone Encounter (Signed)
Pt called, states [facility] said Dr. Naaman Plummer is not longer taking new pt and DR. Hyatt should refer to Dr. Letta Pate or Dr. Posey Pronto. Dr. Marta Antu the referral to Dr. Letta Pate. Completed referral form to refer to Dr. Letta Pate, clinicals and demographics faxed to [facility].

## 2018-03-02 NOTE — Telephone Encounter (Signed)
Faxed required form, clinicals and Demographics to [facility] - Dr. Naaman Plummer.

## 2018-03-10 ENCOUNTER — Other Ambulatory Visit: Payer: Self-pay | Admitting: Family Medicine

## 2018-03-10 NOTE — Telephone Encounter (Signed)
Okay for a 90 day supply Please advise

## 2018-03-10 NOTE — Telephone Encounter (Signed)
Routing to PCP for advise.

## 2018-03-11 NOTE — Telephone Encounter (Signed)
May refill- it appears may have already been refilled

## 2018-03-13 ENCOUNTER — Ambulatory Visit
Admission: RE | Admit: 2018-03-13 | Discharge: 2018-03-13 | Disposition: A | Discharge status: Home / Self Care | Payer: Medicare Other | Source: Ambulatory Visit | Attending: Obstetrics and Gynecology | Admitting: Obstetrics and Gynecology

## 2018-03-13 DIAGNOSIS — Z1231 Encounter for screening mammogram for malignant neoplasm of breast: Secondary | ICD-10-CM

## 2018-03-13 NOTE — Telephone Encounter (Signed)
You may change to 90-day prescription

## 2018-03-13 NOTE — Telephone Encounter (Signed)
It was already refill however pt declined it and requested this Rx for 90 day supply instead of 45 day.

## 2018-03-23 ENCOUNTER — Encounter: Payer: Self-pay | Admitting: Family Medicine

## 2018-03-23 ENCOUNTER — Ambulatory Visit (INDEPENDENT_AMBULATORY_CARE_PROVIDER_SITE_OTHER): Payer: Medicare Other | Admitting: Family Medicine

## 2018-03-23 VITALS — BP 138/80 | HR 54 | Temp 98.3°F | Ht 62.5 in | Wt 179.6 lb

## 2018-03-23 DIAGNOSIS — E785 Hyperlipidemia, unspecified: Secondary | ICD-10-CM | POA: Diagnosis not present

## 2018-03-23 DIAGNOSIS — R739 Hyperglycemia, unspecified: Secondary | ICD-10-CM | POA: Diagnosis not present

## 2018-03-23 DIAGNOSIS — F3342 Major depressive disorder, recurrent, in full remission: Secondary | ICD-10-CM

## 2018-03-23 DIAGNOSIS — I10 Essential (primary) hypertension: Secondary | ICD-10-CM | POA: Diagnosis not present

## 2018-03-23 DIAGNOSIS — K219 Gastro-esophageal reflux disease without esophagitis: Secondary | ICD-10-CM

## 2018-03-23 DIAGNOSIS — N183 Chronic kidney disease, stage 3 unspecified: Secondary | ICD-10-CM

## 2018-03-23 LAB — LIPID PANEL
Cholesterol: 234 mg/dL — ABNORMAL HIGH (ref 0–200)
HDL: 51.5 mg/dL (ref >39.00)
LDL Cholesterol: 157 mg/dL — ABNORMAL HIGH (ref 0–99)
NonHDL: 182.5
Total CHOL/HDL Ratio: 5
Triglycerides: 127 mg/dL (ref 0.0–149.0)
VLDL: 25.4 mg/dL (ref 0.0–40.0)

## 2018-03-23 LAB — BASIC METABOLIC PANEL
BUN: 18 mg/dL (ref 6–23)
CO2: 27 mEq/L (ref 19–32)
Calcium: 9.6 mg/dL (ref 8.4–10.5)
Chloride: 104 mEq/L (ref 96–112)
Creatinine, Ser: 0.76 mg/dL (ref 0.40–1.20)
GFR: 75.41 mL/min (ref >60.00)
Glucose, Bld: 99 mg/dL (ref 70–99)
Potassium: 4.2 mEq/L (ref 3.5–5.1)
Sodium: 138 mEq/L (ref 135–145)

## 2018-03-23 NOTE — Addendum Note (Signed)
Addended by: Francis Dowse T on: 03/23/2018 09:58 AM   Modules accepted: Orders

## 2018-03-23 NOTE — Patient Instructions (Addendum)
Health Maintenance Due  Topic Date Due  . INFLUENZA VACCINE  Completed at work!- Please stop by the front desk to sign a release of information for flu shot 08/25/2017   Could try neil med sinus rinse for nasal congestion or flonase for a good 2 week trial if that doesn't help   Please stop by lab before you go If you do not have mychart- we will call you about results within 5 business days of [place] receiving them.  If you have mychart- we will send your results within 3 business days of [place] receiving them.  If abnormal or we want to clarify a result, we will call or mychart you to make sure you receive the message.  If you have questions or concerns or don't hear within 5-7 days, please send [place] a message or call us.

## 2018-03-23 NOTE — Assessment & Plan Note (Signed)
S: Has been well controlled on Prilosec. zantac was not helpful for her.  A/P: did not tolerate zantac- will continue prilosec- controlled on this.

## 2018-03-23 NOTE — Progress Notes (Signed)
Phone [phone]   Subjective:  Ann Washington is a 68 y.o. year old very pleasant female patient who presents for/with See problem oriented charting ROS- No chest pain or shortness of breath. No headache. Night issues with blurry vision- discussing glasses with Dr. Satira Sark  Past Medical History-  Patient Active Problem List   Diagnosis Date Noted  . CKD (chronic kidney disease), stage III ([place]) 03/14/2014    Priority: Medium  . Glaucoma 03/08/2014    Priority: Medium  . Insomnia 03/08/2014    Priority: Medium  . Hyperglycemia 12/09/2009    Priority: Medium  . Hyperlipidemia 10/13/2006    Priority: Medium  . Major depression, recurrent, full remission ([place]) 10/13/2006    Priority: Medium  . Hypertension, essential 10/10/2006    Priority: Medium  . Normocytic anemia 09/12/2014    Priority: Low  . Stress incontinence, female 10/12/2011    Priority: Low  . Vaginal atrophy 05/27/2011    Priority: Low  . Obesity 10/11/2008    Priority: Low  . GERD- failed h2 blocker 08/25/2007    Priority: Low    Medications- reviewed and updated Current Outpatient Medications  Medication Sig Dispense Refill  . amLODipine-valsartan (EXFORGE) 5-320 MG tablet Take 1 tablet by mouth daily. 90 tablet 3  . Calcium Carbonate-Vit D-Min 600-400 MG-UNIT TABS Take by mouth daily.      Marland Kitchen escitalopram (LEXAPRO) 20 MG tablet TAKE 1/2 TABLETS BY MOUTH DAILY 45 tablet 2  . Multiple Vitamin (MULTIVITAMIN) tablet Take 1 tablet by mouth daily.      Marland Kitchen omeprazole (PRILOSEC) 20 MG capsule Take 20 mg by mouth daily.    Marland Kitchen PROLENSA 0.07 % SOLN PLACE 1 DROP INTO LEFT EYE ONCE DAILY, START 2 DAYS BEFORE SURGERY    . Red Yeast Rice Extract (RED YEAST RICE PO) Take 2 tablets by mouth at bedtime.    . timolol (TIMOPTIC) 0.5 % ophthalmic solution Place 1 drop into both eyes 2 (two) times daily.  0  . travoprost, benzalkonium, (TRAVATAN) 0.004 % ophthalmic solution Place 1 drop into both eyes at bedtime.      Marland Kitchen  zolpidem (AMBIEN) 5 MG tablet TAKE 1 TABLET BY MOUTH EVERYDAY AT BEDTIME 30 tablet 4     Objective:  BP 138/80 (BP Location: Right Arm, Patient Position: Sitting, Cuff Size: Large)   Pulse (!) 54   Temp 98.3 F (36.8 C) (Oral)   Ht 5' 2.5" (1.588 m)   Wt 179 lb 9.6 oz (81.5 kg)   SpO2 97%   BMI 32.33 kg/m  Gen: NAD, resting comfortably CV: RRR-not bradycardic on my exam-no murmurs rubs or gallops Lungs: CTAB no crackles, wheeze, rhonchi Ext: no edema Skin: warm, dry Neuro: Normal speech.    Assessment and Plan   #Hypertension/CKD stage III S: Compliant with amlodipine-valsartan 5-320 mg.  Knows to avoid NSAIDs-have discussed Celebrex prescribed by Dr. Milinda Pointer.  Valsartan is protective in case there is a proteinuric element.  A/P: high normal BP today. Kidneys have been stable- will check on them again today.    GFR has actually been above 60-2017 and beyond-very strongly considering changing this to CKD stage II  #Hyperlipidemia S: Poorly controlled on red yeast rice alone.  Myalgias on pravastatin in the past. Efforts for weight loss hampered by foot bothering her- walking was her exercise routine. Feels doing "fair on diet"- doing salad daily.  A/P: I have recommended atorvastatin 20 mg once a week if can't get LDL under 130 on labs today -  update direct LDL. We also discussed alternative plant based whole food diet with more info through nutritionfacts.org   #Hyperglycemia S: Increased risk of diabetes.  No current medication.  Lifestyle modification only- hasnt been able to make signficant change A/P: hopefully stable on a1c- update today.    #Depression in full remission  S: Compliant with Lexapro 10 mg.  PHQ 9 of0  A/P: Stable. Continue current medications.   Other notes: 1.still dealing with left foot pain after surgery last may. Seeing Dr. Ella Bodo next week to see if there are other options to help her with pain.  2. Has hampered her exercise patterns.  3. Could  try neil med sinus rinse for nasal congestion or flonase for a good 2 week trial if that doesn't help  GERD- failed h2 blocker S: Has been well controlled on Prilosec. zantac was not helpful for her.  A/P: did not tolerate zantac- will continue prilosec- controlled on this.    Future Appointments  Date Time Provider [place]  03/27/2018  2:15 PM [facility] None  03/27/2018  2:30 PM Kirsteins, Luanna Salk, MD [facility] None  10/05/2018  9:00 AM [facility] CO[facility]   Return in about 6 months (around 09/21/2018) for follow up- or sooner if needed.  Lab/Order associations: fasting- wants full lipid panel instead of LDL- knows may have some additional cost Hyperlipidemia, unspecified hyperlipidemia type - Plan: Basic metabolic panel, Lipid panel, CANCELED: LDL cholesterol, direct  Major depression, recurrent, full remission ([place])  Hypertension, essential  Hyperglycemia  CKD (chronic kidney disease), stage III ([facility])  Gastroesophageal reflux disease without esophagitis  Return precautions advised.  Garret Reddish, MD

## 2018-03-27 ENCOUNTER — Encounter: Payer: Self-pay | Admitting: Physical Medicine & Rehabilitation

## 2018-03-27 ENCOUNTER — Ambulatory Visit (HOSPITAL_BASED_OUTPATIENT_CLINIC_OR_DEPARTMENT_OTHER): Payer: Medicare Other | Admitting: Physical Medicine & Rehabilitation

## 2018-03-27 ENCOUNTER — Encounter: Payer: Medicare Other | Attending: Physical Medicine & Rehabilitation

## 2018-03-27 VITALS — BP 141/77 | HR 60 | Ht 62.5 in | Wt 185.0 lb

## 2018-03-27 DIAGNOSIS — Z8262 Family history of osteoporosis: Secondary | ICD-10-CM | POA: Insufficient documentation

## 2018-03-27 DIAGNOSIS — G249 Dystonia, unspecified: Secondary | ICD-10-CM | POA: Diagnosis not present

## 2018-03-27 DIAGNOSIS — G8929 Other chronic pain: Secondary | ICD-10-CM | POA: Diagnosis not present

## 2018-03-27 DIAGNOSIS — M25572 Pain in left ankle and joints of left foot: Secondary | ICD-10-CM

## 2018-03-27 DIAGNOSIS — Z833 Family history of diabetes mellitus: Secondary | ICD-10-CM | POA: Diagnosis not present

## 2018-03-27 DIAGNOSIS — I10 Essential (primary) hypertension: Secondary | ICD-10-CM | POA: Diagnosis not present

## 2018-03-27 DIAGNOSIS — Z8249 Family history of ischemic heart disease and other diseases of the circulatory system: Secondary | ICD-10-CM | POA: Insufficient documentation

## 2018-03-27 DIAGNOSIS — Z803 Family history of malignant neoplasm of breast: Secondary | ICD-10-CM | POA: Insufficient documentation

## 2018-03-27 DIAGNOSIS — G248 Other dystonia: Secondary | ICD-10-CM | POA: Diagnosis not present

## 2018-03-27 NOTE — Progress Notes (Signed)
Subjective:    Patient ID: Ann Washington, female    DOB: 03-25-1950, 68 y.o.   MRN: [id number]  HPI  CC:  Chronic Left foot  ankle pain 68 year old female with chronic right medial ankle pain with walking.  Originally seen by podiatry in 2016 diagnosed with posterior tibial tendinopathy.  A corticosteroid injection was given.  Did well for a while but then came progressively worse underwent podiatry re-evaluation 2018, no preoperative physical therapy, Pt c/o both medial and lateral ankle pain Pt underwent Kidner procedure 05/27/2017. Post operative Short leg cast x 2 didn't have pain while immobilized for 6-8wks Physical therapy started in August 2019 But was not helpful and in fact pt felt worse with PT Pt has also noted a decline in balance.  The patient has had postoperative swelling but no numbness or tingling.  No changes in skin color.  No abnormal sweating. No back pain radiating into the left foot.  Patient exercises 3 days a week with treadmill and bicycle. Pain Inventory Average Pain 7 Pain Right Now 7 My pain is stiffness  In the last 24 hours, has pain interfered with the following? General activity 0 Relation with others 0 Enjoyment of life 8 What TIME of day is your pain at its worst? evening Sleep (in general) Fair  Pain is worse with: walking Pain improves with: rest Relief from Meds: 0  Mobility walk without assistance ability to climb steps?  no do you drive?  yes  Function employed # of hrs/week 15  Neuro/Psych No problems in this area  Prior Studies Any changes since last visit?  no  Physicians involved in your care Any changes since last visit?  no   Family History  Problem Relation Age of Onset  . Diabetes Sister   . Hypertension Sister   . Alzheimer's disease Mother   . Hip fracture Mother   . Osteoporosis Mother   . Deep vein thrombosis Father   . Heart disease Father        MI 82s, nonsmoker  . Hypertension Brother   .  Diabetes Maternal Grandmother   . Stroke Maternal Grandfather   . Depression Paternal Grandmother   . Suicidality Paternal Grandfather   . Depression Paternal Grandfather   . Breast cancer Sister   . Arthritis Sister   . Colon cancer Neg Hx    Social History   Socioeconomic History  . Marital status: Married    Spouse name: Not on file  . Number of children: Not on file  . Years of education: Not on file  . Highest education level: Not on file  Occupational History  . Not on file  Social Needs  . Financial resource strain: Not on file  . Food insecurity:    Worry: Not on file    Inability: Not on file  . Transportation needs:    Medical: Not on file    Non-medical: Not on file  Tobacco Use  . Smoking status: Never Smoker  . Smokeless tobacco: Never Used  Substance and Sexual Activity  . Alcohol use: No  . Drug use: No  . Sexual activity: Yes    Birth control/protection: Surgical  Lifestyle  . Physical activity:    Days per week: Not on file    Minutes per session: Not on file  . Stress: Not on file  Relationships  . Social connections:    Talks on phone: Not on file    Gets together: Not on file  Attends religious service: Not on file    Active member of club or organization: Not on file    Attends meetings of clubs or organizations: Not on file    Relationship status: Not on file  Other Topics Concern  . Not on file  Social History Narrative   Married (husband patient at [facility]). 3 children. 7 grandkids.       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 CATARACT EXTRACTION     late 2019  . COLONOSCOPY    . COLPOSCOPY     age 62  . GYNECOLOGIC CRYOSURGERY     age 3  . HAMMER TOE SURGERY    . TUBAL LIGATION     Past Medical History:  Diagnosis Date  . CIN I (cervical intraepithelial  neoplasia I)    around age 53  . Depression   . 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 GERD (gastroesophageal reflux disease)   . Glaucoma   . Hyperlipidemia   . Hypertension   . Snoring 03/08/2008   Tested 2007 per patient-unrevealing for sleep apnea     BP (!) 141/77 Comment: forgot to take BP meds  Pulse 60   Ht 5' 2.5" (1.588 m)   Wt 185 lb (83.9 kg)   SpO2 97%   BMI 33.30 kg/m   Opioid Risk Score:   Fall Risk Score:  `1  Depression screen PHQ 2/9  Depression screen [facility] 2/9 03/23/2018 09/14/2017 03/18/2017 04/01/2016 03/13/2015 03/08/2014  Decreased Interest 0 0 0 0 0 0  Down, Depressed, Hopeless 0 0 0 0 0 0  PHQ - 2 Score 0 0 0 0 0 0  Altered sleeping 0 0 0 - - -  Tired, decreased energy 0 0 0 - - -  Change in appetite 0 0 0 - - -  Feeling bad or failure about yourself  0 0 0 - - -  Trouble concentrating 0 0 0 - - -  Moving slowly or fidgety/restless 0 0 0 - - -  Suicidal thoughts 0 0 0 - - -  PHQ-9 Score 0 0 0 - - -  Difficult doing work/chores Not difficult at all - - - - -     Review of Systems  Constitutional: Negative.   HENT: Negative.   Eyes: Negative.   Respiratory: Negative.   Cardiovascular: Negative.   Gastrointestinal: Negative.   Endocrine: Negative.   Genitourinary: Negative.   Musculoskeletal: Positive for arthralgias, gait problem and myalgias.  Skin: Negative.   Allergic/Immunologic: Negative.   Hematological: Negative.   Psychiatric/Behavioral: Negative.   All other systems reviewed and are negative.      Objective:   Physical Exam Well-developed well-nourished overweight female no acute acute distress Lungs are clear Heart regular rate and rhythm no rubs murmurs or sounds Abdomen positive bowel sounds soft nontender palpation Extremities no pretibial edema there is fullness at bilateral lateral malleoli as well as the left medial malleoli are area no pedal edema. Feet are warm dry no  evidence of cyanosis in the digits. Motor strength is 5/5 bilateral deltoid bicep tricep grip hip flexor knee extensor and right ankle dorsiflexor 4/5 right left ankle dorsiflexor 3/5 left ankle plantar flexor unable to come up on her toes on  the left side but is able to do so on the right side with full body weight. There is decreased foot inversion on the left side but intact eversion.  Toe flexion and extension are normal. Sensation intact to light touch as well as pinprick in bilateral peroneal deep peroneal saphenous and sural nerve distributions. Negative straight leg raising bilaterally Lumbar range of motion is normal.  No tenderness in the lumbar spine. No evidence of knee effusion.  Needle EMG audio component demonstrates no active motor unit in the left peroneal is longus at rest No active motor units or spontaneous activity in the left peroneal's tertius at rest Abundant motor unit action potentials in the left peroneal is left brevis at rest patient is unable to relax that muscle.  Tenderness palpation at the medial malleolus posteriorly as well as posterior to the lateral malleolus.      Assessment & Plan:  1.  Chronic left ankle eversion with medial and lateral ankle pain.  EMG demonstrating muscle overactivity in the left peroneal's brevis consistent with focal dystonia.  This certainly can be causing or contributing to the patient's pain and certainly to the inability to invert the foot. Would recommend botulinum toxin 75 units to the left peroneal's brevis muscle. We will schedule for this this will be done under EMG guidance.

## 2018-04-07 ENCOUNTER — Other Ambulatory Visit: Payer: Self-pay | Admitting: Family Medicine

## 2018-04-18 ENCOUNTER — Telehealth: Payer: Self-pay

## 2018-04-18 ENCOUNTER — Other Ambulatory Visit: Payer: Self-pay

## 2018-04-18 ENCOUNTER — Ambulatory Visit: Payer: Medicare Other | Admitting: Physical Medicine & Rehabilitation

## 2018-04-18 VITALS — BP 123/77 | HR 61 | Ht 62.5 in | Wt 185.0 lb

## 2018-04-18 DIAGNOSIS — Z8249 Family history of ischemic heart disease and other diseases of the circulatory system: Secondary | ICD-10-CM | POA: Diagnosis not present

## 2018-04-18 DIAGNOSIS — G249 Dystonia, unspecified: Secondary | ICD-10-CM | POA: Diagnosis not present

## 2018-04-18 DIAGNOSIS — G248 Other dystonia: Secondary | ICD-10-CM

## 2018-04-18 DIAGNOSIS — G8929 Other chronic pain: Secondary | ICD-10-CM | POA: Diagnosis not present

## 2018-04-18 DIAGNOSIS — I10 Essential (primary) hypertension: Secondary | ICD-10-CM | POA: Diagnosis not present

## 2018-04-18 DIAGNOSIS — Z833 Family history of diabetes mellitus: Secondary | ICD-10-CM | POA: Diagnosis not present

## 2018-04-18 DIAGNOSIS — M25572 Pain in left ankle and joints of left foot: Secondary | ICD-10-CM | POA: Diagnosis not present

## 2018-04-18 NOTE — Patient Instructions (Signed)
Do circles with Left foot and ankle  Practice standing on pillow  No walking in yard until I see you in 6 wks

## 2018-04-18 NOTE — Progress Notes (Signed)
Botox Injection for spasticity using needle EMG guidance  Dilution: 50 Units/ml Indication: Severe spasticity which interferes with ADL,mobility and/or  hygiene and is unresponsive to medication management and other conservative care Informed consent was obtained after describing risks and benefits of the procedure with the patient. This includes bleeding, bruising, infection, excessive weakness, or medication side effects. A REMS form is on file and signed. Needle: 27g 1" needle electrode Number of units per muscle Left peroneus brevis muscle 50 unit x 2=100U     All injections were done after obtaining appropriate EMG activity and after negative drawback for blood. The patient tolerated the procedure well. Post procedure instructions were given. A followup appointment was made.

## 2018-04-18 NOTE — Telephone Encounter (Signed)
error 

## 2018-05-30 ENCOUNTER — Encounter: Payer: Medicare Other | Attending: Physical Medicine & Rehabilitation | Admitting: Physical Medicine & Rehabilitation

## 2018-05-30 ENCOUNTER — Encounter: Payer: Self-pay | Admitting: Physical Medicine & Rehabilitation

## 2018-05-30 ENCOUNTER — Other Ambulatory Visit: Payer: Self-pay

## 2018-05-30 DIAGNOSIS — Z8249 Family history of ischemic heart disease and other diseases of the circulatory system: Secondary | ICD-10-CM | POA: Insufficient documentation

## 2018-05-30 DIAGNOSIS — Z8262 Family history of osteoporosis: Secondary | ICD-10-CM | POA: Insufficient documentation

## 2018-05-30 DIAGNOSIS — G8929 Other chronic pain: Secondary | ICD-10-CM | POA: Diagnosis not present

## 2018-05-30 DIAGNOSIS — G249 Dystonia, unspecified: Secondary | ICD-10-CM | POA: Insufficient documentation

## 2018-05-30 DIAGNOSIS — G248 Other dystonia: Secondary | ICD-10-CM | POA: Diagnosis not present

## 2018-05-30 DIAGNOSIS — M25572 Pain in left ankle and joints of left foot: Secondary | ICD-10-CM | POA: Insufficient documentation

## 2018-05-30 DIAGNOSIS — I10 Essential (primary) hypertension: Secondary | ICD-10-CM | POA: Insufficient documentation

## 2018-05-30 DIAGNOSIS — Z803 Family history of malignant neoplasm of breast: Secondary | ICD-10-CM | POA: Insufficient documentation

## 2018-05-30 DIAGNOSIS — Z833 Family history of diabetes mellitus: Secondary | ICD-10-CM | POA: Insufficient documentation

## 2018-05-30 NOTE — Progress Notes (Signed)
Subjective:    Patient ID: Ann Washington, female    DOB: 26-May-1950, 68 y.o.   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 The patient is not wearing any type of ankle brace at this time.  We discussed focal dystonia in general and the effects of the Botox on her specific case Pain Inventory Average Pain 0 Pain Right Now 0 My pain is no pain  In the last 24 hours, has pain interfered with the following? General activity 0 Relation with others 0 Enjoyment of life 0 What TIME of day is your pain at its worst? no pain Sleep (in general) Good  Pain is worse with: no pain Pain improves with: no pain Relief from Meds: no pain  Mobility walk without assistance ability to climb steps?  yes do you drive?  yes  Function employed # of hrs/week .  Neuro/Psych trouble walking  Prior Studies Any changes since last visit?  yes  Botox seems to be wearing off.  Back of foot/ankle feels weak but stiiffness on left lateral side  Physicians involved in your care Any changes since last visit?  no   Family History  Problem Relation Age of Onset  . Diabetes Sister   . Hypertension Sister   . Alzheimer's disease Mother   . Hip fracture Mother   . Osteoporosis Mother   . Deep vein thrombosis Father   . Heart disease Father        MI 33s, nonsmoker  . Hypertension Brother   . Diabetes Maternal Grandmother   . Stroke Maternal Grandfather   . Depression Paternal Grandmother   . Suicidality Paternal Grandfather   . Depression Paternal Grandfather   . Breast cancer Sister   . Arthritis Sister   . Colon cancer Neg Hx    Social History   Socioeconomic History  . Marital status: Married    Spouse name: Not on  file  . Number of children: Not on file  . Years of education: Not on file  . Highest education level: Not on file  Occupational History  . Not on file  Social Needs  . Financial resource strain: Not on file  . Food insecurity:    Worry: Not on file    Inability: Not on file  . Transportation needs:    Medical: Not on file    Non-medical: Not on file  Tobacco Use  . Smoking status: Never Smoker  . Smokeless tobacco: Never Used  Substance and Sexual Activity  . Alcohol use: No  . Drug use: No  . Sexual activity: Yes    Birth control/protection: Surgical  Lifestyle  . Physical activity:    Days per week: Not on file    Minutes per session: Not on file  . Stress: Not on file  Relationships  . Social connections:    Talks on phone: Not on file    Gets together: Not on file    Attends religious service: Not on file    Active member of club or organization: Not on file    Attends meetings of clubs or organizations: Not on file    Relationship status: Not on file  Other Topics Concern  . Not on file  Social History Narrative   Married (husband patient at [facility]). 3 children. 7 grandkids.       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 CATARACT EXTRACTION     late 2019  . COLONOSCOPY    . COLPOSCOPY     age 27  . GYNECOLOGIC CRYOSURGERY     age 60  . HAMMER TOE SURGERY    . TUBAL LIGATION     Past Medical History:  Diagnosis Date  . CIN I (cervical intraepithelial neoplasia I)    around age 38  . Depression   . 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 GERD (gastroesophageal reflux disease)   . Glaucoma   . Hyperlipidemia   . Hypertension   . Snoring 03/08/2008   Tested 2007 per patient-unrevealing for sleep apnea     There were no vitals taken for this  visit.  Opioid Risk Score:   Fall Risk Score:  `1  Depression screen PHQ 2/9  Depression screen [facility] 2/9 05/30/2018 03/27/2018 03/23/2018 09/14/2017 03/18/2017 04/01/2016 03/13/2015  Decreased Interest 0 0 0 0 0 0 0  Down, Depressed, Hopeless 0 0 0 0 0 0 0  PHQ - 2 Score 0 0 0 0 0 0 0  Altered sleeping - 0 0 0 0 - -  Tired, decreased energy - 0 0 0 0 - -  Change in appetite - 0 0 0 0 - -  Feeling bad or failure about yourself  - 0 0 0 0 - -  Trouble concentrating - 0 0 0 0 - -  Moving slowly or fidgety/restless - 0 0 0 0 - -  Suicidal thoughts - 0 0 0 0 - -  PHQ-9 Score - 0 0 0 0 - -  Difficult doing work/chores - Not difficult at all Not difficult at all - - - -   Review of Systems  HENT: Negative.   Eyes: Negative.   Respiratory: Negative.   Cardiovascular: Negative.   Gastrointestinal: Negative.   Endocrine: Negative.   Genitourinary: Negative.   Musculoskeletal: Positive for gait problem.  Skin: Negative.   Allergic/Immunologic: Negative.   Neurological: Positive for weakness.  Hematological: Negative.   Psychiatric/Behavioral: Negative.   All other systems reviewed and are negative.      Objective:   Physical Exam Nursing note reviewed.  Neurological:     Mental Status: She is alert. Mental status is at baseline.  Psychiatric:        Mood and Affect: Mood normal.        Behavior: Behavior normal.        Thought Content: Thought content normal.        Judgment: Judgment normal.    General no acute distress Mood and affect are appropriate Oriented x3   Remainder examination deferred secondary to tele-visit     Assessment & Plan:  1.  Focal dystonia left foot evertors.  EMG analysis demonstrates that this is isolated to the Proteus brevis muscle.  The patient was treated with 100 units of Botox.  This has been helpful.  She does note some end of the day fatigue in her ankle area and certainly this can be partially due to the effect of the botulinum toxin.  We  discussed  using a light weight ankle brace that would fit within her shoe in the afternoons on her workdays. We also discussed that her Botox dose may be reduced to this 50 or 75 units which may help mitigate some of this sensation. We discussed that the Botox should start wearing off in about a month.

## 2018-07-21 ENCOUNTER — Encounter: Payer: Medicare Other | Attending: Physical Medicine & Rehabilitation | Admitting: Physical Medicine & Rehabilitation

## 2018-07-21 ENCOUNTER — Other Ambulatory Visit: Payer: Self-pay

## 2018-07-21 ENCOUNTER — Other Ambulatory Visit: Payer: Self-pay | Admitting: Family Medicine

## 2018-07-21 ENCOUNTER — Encounter: Payer: Self-pay | Admitting: Physical Medicine & Rehabilitation

## 2018-07-21 VITALS — BP 129/80 | HR 60 | Temp 97.7°F | Ht 63.0 in | Wt 182.0 lb

## 2018-07-21 DIAGNOSIS — Z8262 Family history of osteoporosis: Secondary | ICD-10-CM | POA: Insufficient documentation

## 2018-07-21 DIAGNOSIS — I10 Essential (primary) hypertension: Secondary | ICD-10-CM | POA: Diagnosis not present

## 2018-07-21 DIAGNOSIS — G8929 Other chronic pain: Secondary | ICD-10-CM | POA: Diagnosis not present

## 2018-07-21 DIAGNOSIS — M25572 Pain in left ankle and joints of left foot: Secondary | ICD-10-CM | POA: Diagnosis not present

## 2018-07-21 DIAGNOSIS — Z833 Family history of diabetes mellitus: Secondary | ICD-10-CM | POA: Insufficient documentation

## 2018-07-21 DIAGNOSIS — G248 Other dystonia: Secondary | ICD-10-CM | POA: Insufficient documentation

## 2018-07-21 DIAGNOSIS — G249 Dystonia, unspecified: Secondary | ICD-10-CM | POA: Insufficient documentation

## 2018-07-21 DIAGNOSIS — Z803 Family history of malignant neoplasm of breast: Secondary | ICD-10-CM | POA: Diagnosis not present

## 2018-07-21 DIAGNOSIS — Z8249 Family history of ischemic heart disease and other diseases of the circulatory system: Secondary | ICD-10-CM | POA: Diagnosis not present

## 2018-07-21 NOTE — Telephone Encounter (Signed)
Requesting refill for Zolpidem, Last OV 03/23/2018, Last refill 02/20/2018, #30, refill x 4.

## 2018-07-21 NOTE — Progress Notes (Signed)
Botox Injection for spasticity using needle EMG guidance  Dilution: 50 Units/ml Indication: Severe spasticity which interferes with ADL,mobility and/or  hygiene and is unresponsive to medication management and other conservative care Informed consent was obtained after describing risks and benefits of the procedure with the patient. This includes bleeding, bruising, infection, excessive weakness, or medication side effects. A REMS form is on file and signed. Needle: 27g 1" needle electrode Number of units per muscle Left peroneus brevis muscle 50 unit x 1    All injections were done after obtaining appropriate EMG activity and after negative drawback for blood. The patient tolerated the procedure well. Post procedure instructions were given. A followup appointment was made.

## 2018-07-21 NOTE — Patient Instructions (Signed)

## 2018-07-25 DIAGNOSIS — H401122 Primary open-angle glaucoma, left eye, moderate stage: Secondary | ICD-10-CM | POA: Diagnosis not present

## 2018-09-04 ENCOUNTER — Encounter: Payer: Self-pay | Admitting: Physical Medicine & Rehabilitation

## 2018-09-04 ENCOUNTER — Other Ambulatory Visit: Payer: Self-pay

## 2018-09-04 ENCOUNTER — Encounter: Payer: Medicare Other | Attending: Physical Medicine & Rehabilitation | Admitting: Physical Medicine & Rehabilitation

## 2018-09-04 VITALS — BP 119/71 | HR 61 | Temp 99.0°F | Ht 62.5 in | Wt 183.0 lb

## 2018-09-04 DIAGNOSIS — Z803 Family history of malignant neoplasm of breast: Secondary | ICD-10-CM | POA: Diagnosis not present

## 2018-09-04 DIAGNOSIS — G248 Other dystonia: Secondary | ICD-10-CM

## 2018-09-04 DIAGNOSIS — I10 Essential (primary) hypertension: Secondary | ICD-10-CM | POA: Insufficient documentation

## 2018-09-04 DIAGNOSIS — Z833 Family history of diabetes mellitus: Secondary | ICD-10-CM | POA: Diagnosis not present

## 2018-09-04 DIAGNOSIS — Z8262 Family history of osteoporosis: Secondary | ICD-10-CM | POA: Diagnosis not present

## 2018-09-04 DIAGNOSIS — G8929 Other chronic pain: Secondary | ICD-10-CM | POA: Insufficient documentation

## 2018-09-04 DIAGNOSIS — G249 Dystonia, unspecified: Secondary | ICD-10-CM | POA: Diagnosis not present

## 2018-09-04 DIAGNOSIS — Z8249 Family history of ischemic heart disease and other diseases of the circulatory system: Secondary | ICD-10-CM | POA: Diagnosis not present

## 2018-09-04 DIAGNOSIS — M25572 Pain in left ankle and joints of left foot: Secondary | ICD-10-CM | POA: Insufficient documentation

## 2018-09-04 NOTE — Progress Notes (Signed)
Subjective:    Patient ID: Ann Washington, female    DOB: [birth year], 68 y.o.   MRN: [id number]  HPI  68 year old female with history of medial and lateral ankle pain diagnosed with tibial tendinopathy on the left side in 2016.  Eventually underwent Kidner procedure in 2019  The patient states her ankle does not pull out as much since the botulinum toxin injection Standing up from sitting still causing stiffness in the ankle.  Feels like 50U was better for balance  The patient has not performed any of her home exercise program Pain Inventory Average Pain 0 Pain Right Now 0 My pain is na  In the last 24 hours, has pain interfered with the following? General activity 0 Relation with others 0 Enjoyment of life 0 What TIME of day is your pain at its worst? na Sleep (in general) Fair  Pain is worse with: some activites Pain improves with: rest Relief from Meds: .  Mobility ability to climb steps?  yes do you drive?  yes  Function employed # of hrs/week 15  Neuro/Psych No problems in this area  Prior Studies Any changes since last visit?  no  Physicians involved in your care Any changes since last visit?  no   Family History  Problem Relation Age of Onset  . Diabetes Sister   . Hypertension Sister   . Alzheimer's disease Mother   . Hip fracture Mother   . Osteoporosis Mother   . Deep vein thrombosis Father   . Heart disease Father        MI 35s, nonsmoker  . Hypertension Brother   . Diabetes Maternal Grandmother   . Stroke Maternal Grandfather   . Depression Paternal Grandmother   . Suicidality Paternal Grandfather   . Depression Paternal Grandfather   . Breast cancer Sister   . Arthritis Sister   . Colon cancer Neg Hx    Social History   Socioeconomic History  . Marital status: Married    Spouse name: Not on file  . Number of children: Not on file  . Years of education: Not on file  . Highest education level: Not on file  Occupational History   . Not on file  Social Needs  . Financial resource strain: Not on file  . Food insecurity    Worry: Not on file    Inability: Not on file  . Transportation needs    Medical: Not on file    Non-medical: Not on file  Tobacco Use  . Smoking status: Never Smoker  . Smokeless tobacco: Never Used  Substance and Sexual Activity  . Alcohol use: No  . Drug use: No  . Sexual activity: Yes    Birth control/protection: Surgical  Lifestyle  . Physical activity    Days per week: Not on file    Minutes per session: Not on file  . Stress: Not on file  Relationships  . Social Herbalist on phone: Not on file    Gets together: Not on file    Attends religious service: Not on file    Active member of club or organization: Not on file    Attends meetings of clubs or organizations: Not on file    Relationship status: Not on file  Other Topics Concern  . Not on file  Social History Narrative   Married (husband patient at [facility]). 3 children. 7 grandkids.       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 CATARACT EXTRACTION     late 2019  . COLONOSCOPY    . COLPOSCOPY     age 5  . GYNECOLOGIC CRYOSURGERY     age 72  . HAMMER TOE SURGERY    . TUBAL LIGATION     Past Medical History:  Diagnosis Date  . CIN I (cervical intraepithelial neoplasia I)    around age 28  . Depression   . 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 GERD (gastroesophageal reflux disease)   . Glaucoma   . Hyperlipidemia   . Hypertension   . Snoring 03/08/2008   Tested 2007 per patient-unrevealing for sleep apnea     BP 119/71   Pulse 61   Temp 99 F (37.2 C)   Ht 5' 2.5" (1.588 m)   Wt 183 lb (83 kg)   SpO2 97%   BMI 32.94 kg/m   Opioid Risk Score:   Fall Risk Score:  `1   Depression screen PHQ 2/9  Depression screen [facility] 2/9 05/30/2018 03/27/2018 03/23/2018 09/14/2017 03/18/2017 04/01/2016 03/13/2015  Decreased Interest 0 0 0 0 0 0 0  Down, Depressed, Hopeless 0 0 0 0 0 0 0  PHQ - 2 Score 0 0 0 0 0 0 0  Altered sleeping - 0 0 0 0 - -  Tired, decreased energy - 0 0 0 0 - -  Change in appetite - 0 0 0 0 - -  Feeling bad or failure about yourself  - 0 0 0 0 - -  Trouble concentrating - 0 0 0 0 - -  Moving slowly or fidgety/restless - 0 0 0 0 - -  Suicidal thoughts - 0 0 0 0 - -  PHQ-9 Score - 0 0 0 0 - -  Difficult doing work/chores - Not difficult at all Not difficult at all - - - -     Review of Systems  Constitutional: Negative.   HENT: Negative.   Eyes: Negative.   Respiratory: Negative.   Cardiovascular: Negative.   Gastrointestinal: Negative.   Endocrine: Negative.   Genitourinary: Negative.   Musculoskeletal: Negative.   Skin: Negative.   Allergic/Immunologic: Negative.   Neurological: Negative.   Hematological: Negative.   Psychiatric/Behavioral: Negative.   All other systems reviewed and are negative.      Objective:   Physical Exam Vitals signs and nursing note reviewed.  Constitutional:      Appearance: She is obese.  Eyes:     Extraocular Movements: Extraocular movements intact.     Pupils: Pupils are equal, round, and reactive to light.  Musculoskeletal:     Left foot: Decreased range of motion. No tenderness.     Comments: Patient has decreased inversion at the left ankle and foot area, approximately 25% of normal. Dorsiflexion plantarflexion are approximately 50% of normal  There is mild tenderness palpation over the lateral supra malleoli are area.  No tenderness over the Achilles No evidence of ankle joint effusion no evidence of metatarsal effusion no midfoot deformities  Skin:    General: Skin is warm and dry.  Neurological:     Mental Status: She is alert and oriented to person, place, and time.  Psychiatric:        Mood  and Affect:  Mood normal.        Behavior: Behavior normal.           Assessment & Plan:  #1.  Focal dystonia left peroneus brevis, with some improvement after botulinum toxin injection.  She still complains of stiffness in general around the joint but this is more likely capsular and soft tissue and needs to be addressed with her home exercise program.  Her improvement will depend on compliance See the patient back in 6 weeks for repeat injection botulinum toxin 50 units into the left peroneus brevis

## 2018-09-21 ENCOUNTER — Ambulatory Visit (INDEPENDENT_AMBULATORY_CARE_PROVIDER_SITE_OTHER): Payer: Medicare Other | Admitting: Family Medicine

## 2018-09-21 ENCOUNTER — Encounter: Payer: Self-pay | Admitting: Family Medicine

## 2018-09-21 ENCOUNTER — Other Ambulatory Visit: Payer: Self-pay

## 2018-09-21 VITALS — BP 142/82 | HR 60 | Temp 98.3°F | Ht 62.5 in | Wt 181.2 lb

## 2018-09-21 DIAGNOSIS — F3342 Major depressive disorder, recurrent, in full remission: Secondary | ICD-10-CM | POA: Diagnosis not present

## 2018-09-21 DIAGNOSIS — E785 Hyperlipidemia, unspecified: Secondary | ICD-10-CM | POA: Diagnosis not present

## 2018-09-21 DIAGNOSIS — N183 Chronic kidney disease, stage 3 unspecified: Secondary | ICD-10-CM

## 2018-09-21 DIAGNOSIS — E669 Obesity, unspecified: Secondary | ICD-10-CM

## 2018-09-21 DIAGNOSIS — R739 Hyperglycemia, unspecified: Secondary | ICD-10-CM | POA: Diagnosis not present

## 2018-09-21 DIAGNOSIS — I10 Essential (primary) hypertension: Secondary | ICD-10-CM | POA: Diagnosis not present

## 2018-09-21 DIAGNOSIS — G4700 Insomnia, unspecified: Secondary | ICD-10-CM | POA: Diagnosis not present

## 2018-09-21 LAB — CBC
HCT: 40.7 % (ref 36.0–46.0)
Hemoglobin: 13.7 g/dL (ref 12.0–15.0)
MCHC: 33.6 g/dL (ref 30.0–36.0)
MCV: 91.4 fl (ref 78.0–100.0)
Platelets: 253 10*3/uL (ref 150.0–400.0)
RBC: 4.45 Mil/uL (ref 3.87–5.11)
RDW: 13.4 % (ref 11.5–15.5)
WBC: 8.5 10*3/uL (ref 4.0–10.5)

## 2018-09-21 LAB — COMPREHENSIVE METABOLIC PANEL
ALT: 13 U/L (ref 0–35)
AST: 16 U/L (ref 0–37)
Albumin: 4.6 g/dL (ref 3.5–5.2)
Alkaline Phosphatase: 54 U/L (ref 39–117)
BUN: 17 mg/dL (ref 6–23)
CO2: 29 mEq/L (ref 19–32)
Calcium: 9.8 mg/dL (ref 8.4–10.5)
Chloride: 104 mEq/L (ref 96–112)
Creatinine, Ser: 0.76 mg/dL (ref 0.40–1.20)
GFR: 75.3 mL/min (ref >60.00)
Glucose, Bld: 100 mg/dL — ABNORMAL HIGH (ref 70–99)
Potassium: 4.1 mEq/L (ref 3.5–5.1)
Sodium: 139 mEq/L (ref 135–145)
Total Bilirubin: 0.7 mg/dL (ref 0.2–1.2)
Total Protein: 7.8 g/dL (ref 6.0–8.3)

## 2018-09-21 LAB — POC URINALSYSI DIPSTICK (AUTOMATED)
Bilirubin, UA: NEGATIVE
Blood, UA: NEGATIVE
Glucose, UA: NEGATIVE
Ketones, UA: NEGATIVE
Leukocytes, UA: NEGATIVE
Nitrite, UA: NEGATIVE
Protein, UA: NEGATIVE
Spec Grav, UA: 1.01 (ref 1.010–1.025)
Urobilinogen, UA: 0.2 E.U./dL
pH, UA: 6 (ref 5.0–8.0)

## 2018-09-21 LAB — LIPID PANEL
Cholesterol: 261 mg/dL — ABNORMAL HIGH (ref 0–200)
HDL: 52.4 mg/dL (ref >39.00)
LDL Cholesterol: 179 mg/dL — ABNORMAL HIGH (ref 0–99)
NonHDL: 208.3
Total CHOL/HDL Ratio: 5
Triglycerides: 146 mg/dL (ref 0.0–149.0)
VLDL: 29.2 mg/dL (ref 0.0–40.0)

## 2018-09-21 LAB — HEMOGLOBIN A1C: Hgb A1c MFr Bld: 6.3 % (ref 4.6–6.5)

## 2018-09-21 NOTE — Addendum Note (Signed)
Addended by: Francis Dowse T on: 09/21/2018 10:43 AM   Modules accepted: Orders

## 2018-09-21 NOTE — Progress Notes (Signed)
Phone [phone]   Subjective:  Ann Washington is a 68 y.o. year old very pleasant female patient who presents for/with See problem oriented charting Chief Complaint  Patient presents with  . Follow-up    Fasting today.   . Hypertension  . Chronic Kidney Disease  . Hyperlipidemia  . Gastroesophageal Reflux  . Depression   ROS-  Denies HA, dizziness, CP, SOB, visual changes.    Past Medical History-  Patient Active Problem List   Diagnosis Date Noted  . CKD (chronic kidney disease), stage III ([place]) 03/14/2014    Priority: Medium  . Glaucoma 03/08/2014    Priority: Medium  . Insomnia 03/08/2014    Priority: Medium  . Hyperglycemia 12/09/2009    Priority: Medium  . Hyperlipidemia 10/13/2006    Priority: Medium  . Major depression, recurrent, full remission ([place]) 10/13/2006    Priority: Medium  . Hypertension, essential 10/10/2006    Priority: Medium  . Normocytic anemia 09/12/2014    Priority: Low  . Stress incontinence, female 10/12/2011    Priority: Low  . Vaginal atrophy 05/27/2011    Priority: Low  . Obesity 10/11/2008    Priority: Low  . GERD- failed h2 blocker 08/25/2007    Priority: Low  . 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 amLODipine-valsartan (EXFORGE) 5-320 MG tablet TAKE 1 TABLET BY MOUTH EVERY DAY 90 tablet 3  . Apple Cider Vinegar 188 MG CAPS Take by mouth.    . Calcium Carbonate-Vit D-Min 600-400 MG-UNIT TABS Take by mouth daily.      Marland Kitchen escitalopram (LEXAPRO) 20 MG tablet TAKE 1/2 TABLETS BY MOUTH DAILY 45 tablet 2  . Multiple Vitamin (MULTIVITAMIN) tablet Take 1 tablet by mouth daily.      Marland Kitchen omeprazole (PRILOSEC) 20 MG capsule Take 20 mg by mouth daily.    . Red Yeast Rice Extract (RED YEAST RICE PO) Take 2 tablets by mouth at bedtime.    . timolol (TIMOPTIC) 0.5 % ophthalmic solution Place 1 drop into both eyes 2 (two) times daily.  0  .  travoprost, benzalkonium, (TRAVATAN) 0.004 % ophthalmic solution Place 1 drop into both eyes at bedtime.      Marland Kitchen zolpidem (AMBIEN) 5 MG tablet TAKE 1 TABLET BY MOUTH EVERY DAY AT BEDTIME 30 tablet 4   No current facility-administered medications for this visit.      Objective:  BP (!) 142/82   Pulse 60   Temp 98.3 F (36.8 C) (Oral)   Ht 5' 2.5" (1.588 m)   Wt 181 lb 3.2 oz (82.2 kg)   SpO2 97%   BMI 32.61 kg/m  Gen: NAD, resting comfortably CV: RRR no murmurs rubs or gallops Lungs: CTAB no crackles, wheeze, rhonchi Abdomen: soft/nontender/nondistended/normal bowel sounds. Ext: no edema Skin: warm, dry Neuro: grossly normal, moves all extremities     Assessment and Plan   # social update- still able to work- wearing mask at work about 15 hours a week at [employer]. Kids and grandkids are doing well.   #Hypertension/CKD stage III S: Compliant with amlodipine-valsartan 5-320 mg.  Knows to avoid NSAIDs-have discussed Celebrex prescribed by Dr. Milinda Pointer.  Valsartan is protective in case there is a proteinuric element.Checking BP at home, staying consistently below 140/90. BP Readings from Last 3 Encounters:  09/21/18 (!) 142/82  09/04/18 119/71  07/21/18 129/80   PRior GFRs stable A/P: BP improved control on recheck but still slightly high-  she says we have verified home cuff in the past- she will do some home monitoring and update [place] in 2-3 weeks. Update cmp today to monitor GFR. Continue current meds    #Hyperlipidemia S: Poorly controlled on red yeast rice alone. Also trying apple cider vinegar. Myalgias on pravastatin in the past. Have never tried pulse dosing. She did exercise 3x this week- is going to try to continue this Lab Results  Component Value Date   CHOL 234 (H) 03/23/2018   HDL 51.50 03/23/2018   LDLCALC 157 (H) 03/23/2018   LDLDIRECT 166.0 03/18/2017   TRIG 127.0 03/23/2018   CHOLHDL 5 03/23/2018  A/P: she wants to do another full lipid panel- if elevated she is  open to considering pulse dosing rosuvastatin 5 mg just once a week    #Hyperglycemia/obesity S: Increased risk of diabetes.  No current medication.  Lifestyle modification only. Up 2 lbs from last visit Lab Results  Component Value Date   HGBA1C 6.1 09/15/2017  A/P: update a1c today, discussed healthy eating/regular exercise  - prefers to buckle down if a1c trending up   #GERD S: Has been well controlled on Prilosec. zantac was not helpful for her unfortunately.  A/P: Stable. Continue current medications.    #Depression in full remission  S: Compliant with Lexapro 10 mg.  PHQ 9 of 0 today  A/P: Doing well-remains in full remission.  Continue current medications.  # Insomnia S:Taking Zolpidem 5 mg at bedtime.  5-6 hours of restful sleep.  A/P: Well-controlled-continue current medication  Recommended follow up: 6 months follow up Future Appointments  Date Time Provider [place]  10/05/2018  9:30 AM [facility] HEALTH COACH [facility]  10/16/2018  1:30 PM Kirsteins, Luanna Salk, MD [facility]    Lab/Order associations:   ICD-10-CM   1. Hypertension, essential  I10 CBC    Comprehensive metabolic panel    Lipid panel    POCT Urinalysis Dipstick (Automated)    CANCELED: POCT Urinalysis Dipstick (Automated)  2. Hyperlipidemia, unspecified hyperlipidemia type  E78.5 CBC    Comprehensive metabolic panel    Lipid panel    POCT Urinalysis Dipstick (Automated)    CANCELED: POCT Urinalysis Dipstick (Automated)  3. Hyperglycemia  R73.9 Hemoglobin A1c  4. CKD (chronic kidney disease), stage III (H[facility])  N18.3   5. Major depression, recurrent, full remission ([place])  F33.42   6. Insomnia, unspecified type  G47.00   7. Obesity (BMI 30-39.9)  E66.9    Return precautions advised.  Garret Reddish, MD

## 2018-09-21 NOTE — Patient Instructions (Addendum)
Health Maintenance Due  Topic Date Due  . INFLUENZA VACCINE - declines, will get at work. Send [place] a message when you get this.  08/26/2018   Blood pressure still slightly high- she says we have verified home cuff in the past- she will do some home monitoring and update [place] in 2-3 weeks

## 2018-09-25 ENCOUNTER — Other Ambulatory Visit: Payer: Self-pay

## 2018-09-25 MED ORDER — ROSUVASTATIN CALCIUM 5 MG PO TABS: 5.0000 mg | ORAL_TABLET | ORAL | 3 refills | Status: DC

## 2018-10-05 ENCOUNTER — Other Ambulatory Visit: Payer: Self-pay

## 2018-10-05 ENCOUNTER — Ambulatory Visit: Payer: Medicare Other

## 2018-10-05 ENCOUNTER — Ambulatory Visit (INDEPENDENT_AMBULATORY_CARE_PROVIDER_SITE_OTHER): Payer: Medicare Other

## 2018-10-05 VITALS — BP 132/70 | Temp 97.5°F | Ht 63.0 in | Wt 183.2 lb

## 2018-10-05 DIAGNOSIS — Z780 Asymptomatic menopausal state: Secondary | ICD-10-CM | POA: Diagnosis not present

## 2018-10-05 DIAGNOSIS — Z1231 Encounter for screening mammogram for malignant neoplasm of breast: Secondary | ICD-10-CM

## 2018-10-05 DIAGNOSIS — Z0000 Encounter for general adult medical examination without abnormal findings: Secondary | ICD-10-CM | POA: Diagnosis not present

## 2018-10-05 NOTE — Progress Notes (Signed)
I have reviewed and agree with note, evaluation, plan.   Shey Bartmess, MD  

## 2018-10-05 NOTE — Progress Notes (Signed)
Subjective:   Ann Washington is a 68 y.o. female who presents for Medicare Annual (Subsequent) preventive examination.  Review of Systems:   Cardiac Risk Factors include: dyslipidemia;advanced age (>84men, >17 women)     Objective:     Vitals: BP 132/70 (BP Location: Left Arm, Patient Position: Sitting, Cuff Size: Large)   Temp (!) 97.5 F (36.4 C) (Temporal)   Ht 5\' 3"  (1.6 m)   Wt 183 lb 3.2 oz (83.1 kg)   BMI 32.45 kg/m   Body mass index is 32.45 kg/m.  Advanced Directives 10/05/2018 09/14/2017 04/01/2016  Does Patient Have a Medical Advance Directive? No No No  Would patient like information on creating a medical advance directive? Yes (MAU/Ambulatory/Procedural Areas - Information given) No - Patient declined -    Tobacco Social History   Tobacco Use  Smoking Status Never Smoker  Smokeless Tobacco Never Used     Counseling given: Not Answered   Clinical Intake:  Pre-visit preparation completed: Yes  Pain : No/denies pain  Diabetes: No  How often do you need to have someone help you when you read instructions, pamphlets, or other written materials from your doctor or pharmacy?: 1 - Never  Interpreter Needed?: No  Information entered by :: Denman George LPN  Past Medical History:  Diagnosis Date  . CIN I (cervical intraepithelial neoplasia I)    around age 40  . Depression   . 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 GERD (gastroesophageal reflux disease)   . Glaucoma   . Hyperlipidemia   . Hypertension   . Snoring 03/08/2008   Tested 2007 per patient-unrevealing for sleep apnea     Past Surgical History:  Procedure Laterality Date  . CATARACT EXTRACTION     late 2019  . COLONOSCOPY    . COLPOSCOPY     age 59  . GYNECOLOGIC CRYOSURGERY     age 38  . HAMMER TOE SURGERY    . TUBAL LIGATION     Family History  Problem Relation Age of Onset  . Diabetes Sister   . Hypertension Sister   .  Alzheimer's disease Mother   . Hip fracture Mother   . Osteoporosis Mother   . Deep vein thrombosis Father   . Heart disease Father        MI 76s, nonsmoker  . Hypertension Brother   . Diabetes Maternal Grandmother   . Stroke Maternal Grandfather   . Depression Paternal Grandmother   . Suicidality Paternal Grandfather   . Depression Paternal Grandfather   . Breast cancer Sister   . Arthritis Sister   . Colon cancer Neg Hx    Social History   Socioeconomic History  . Marital status: Married    Spouse name: Not on file  . Number of children: Not on file  . Years of education: Not on file  . Highest education level: Not on file  Occupational History  . Not on file  Social Needs  . Financial resource strain: Not on file  . Food insecurity    Worry: Not on file    Inability: Not on file  . Transportation needs    Medical: Not on file    Non-medical: Not on file  Tobacco Use  . Smoking status: Never Smoker  . Smokeless tobacco: Never Used  Substance and Sexual Activity  . Alcohol use: No  . Drug use: No  . Sexual activity: Yes  Birth control/protection: Surgical  Lifestyle  . Physical activity    Days per week: Not on file    Minutes per session: Not on file  . Stress: Not on file  Relationships  . Social Herbalist on phone: Not on file    Gets together: Not on file    Attends religious service: Not on file    Active member of club or organization: Not on file    Attends meetings of clubs or organizations: Not on file    Relationship status: Not on file  Other Topics Concern  . Not on file  Social History Narrative   Married (husband patient at [facility]). 3 children. 7 grandkids.       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 amLODipine-valsartan (EXFORGE) 5-320 MG  tablet TAKE 1 TABLET BY MOUTH EVERY DAY  . Apple Cider Vinegar 188 MG CAPS Take by mouth.  . Calcium Carbonate-Vit D-Min 600-400 MG-UNIT TABS Take by mouth daily.    Marland Kitchen escitalopram (LEXAPRO) 20 MG tablet TAKE 1/2 TABLETS BY MOUTH DAILY  . Multiple Vitamin (MULTIVITAMIN) tablet Take 1 tablet by mouth daily.    Marland Kitchen omeprazole (PRILOSEC) 20 MG capsule Take 20 mg by mouth daily.  . Red Yeast Rice Extract (RED YEAST RICE PO) Take 2 tablets by mouth at bedtime.  . rosuvastatin (CRESTOR) 5 MG tablet Take 1 tablet (5 mg total) by mouth once a week.  . timolol (TIMOPTIC) 0.5 % ophthalmic solution Place 1 drop into both eyes 2 (two) times daily.  . travoprost, benzalkonium, (TRAVATAN) 0.004 % ophthalmic solution Place 1 drop into both eyes at bedtime.    Marland Kitchen zolpidem (AMBIEN) 5 MG tablet TAKE 1 TABLET BY MOUTH EVERY DAY AT BEDTIME   No facility-administered encounter medications on file as of 10/05/2018.     Activities of Daily Living In your present state of health, do you have any difficulty performing the following activities: 10/05/2018  Hearing? N  Vision? N  Difficulty concentrating or making decisions? N  Walking or climbing stairs? N  Dressing or bathing? N  Doing errands, shopping? N  Preparing Food and eating ? N  Using the Toilet? N  In the past six months, have you accidently leaked urine? N  Do you have problems with loss of bowel control? N  Managing your Medications? N  Managing your Finances? N  Housekeeping or managing your Housekeeping? N  Some recent data might be hidden    Patient Care Team: Marin Olp, MD as PCP - General (Family Medicine) Paula Compton, MD as Consulting Physician (Obstetrics and Gynecology) Rolm Bookbinder, MD as Consulting Physician (Dermatology) Letta Pate, Luanna Salk, MD as Consulting Physician (Physical Medicine and Rehabilitation) Marygrace Drought, MD as Consulting Physician (Ophthalmology) Garrel Ridgel, DPM as Consulting Physician (Podiatry)     Assessment:   This is a routine wellness examination for Ann Washington.  Exercise Activities and Dietary recommendations Current Exercise Habits: Structured exercise class, Type of exercise: calisthenics, Time (Minutes): 40, Frequency (Times/Week): 3, Weekly Exercise (Minutes/Week): 120, Intensity: Mild  Goals    . Exercise 150 minutes per week (moderate activity)     Will bump up to 5 days at [employer] Will bike or walk in between the arch trainer     .  Weight (lb) < 170 lb (77.1 kg)     Check out  online nutrition programs as GumSearch.nl and http://vang.com/;  Calorie king.com  Look for foods with "whole" wheat; bran; oatmeal etc Shot at the farmer's markets in season for fresher choices  Watch for "hydrogenated" on the label of oils which are trans-fats.  Watch for "high fructose corn syrup" in snacks, yogurt or ketchup  Meats have less marbling; bright colored fruits and vegetables;  Canned; dump out liquid and wash vegetables. Be mindful of what we are eating  Portion control is essential to a health weight! Sit down; take a break and enjoy your meal; take smaller bites; put the fork down between bites;  It takes 20 minutes to get full; so check in with your fullness cues and stop eating when you start to fill full              Fall Risk Fall Risk  10/05/2018 09/21/2018 09/04/2018 07/21/2018 05/30/2018  Falls in the past year? 0 0 0 0 0  Number falls in past yr: 0 0 - - -  Injury with Fall? 0 0 - - -  Follow up Education provided;Falls evaluation completed - - - -   Is the patient's home free of loose throw rugs in walkways, pet beds, electrical cords, etc?   yes      Grab bars in the bathroom? yes      Handrails on the stairs?   yes      Adequate lighting?   yes  Timed Get Up and Go performed: completed and within normal timeframe; no gait abnormalities noted    Depression Screen PHQ 2/9 Scores 10/05/2018 09/21/2018 05/30/2018 03/27/2018  PHQ - 2 Score 0 0 0 0   PHQ- 9 Score - 0 - 0     Cognitive Function- no cognitive concerns at this time  MMSE - Mini Mental State Exam 04/01/2016  Not completed: (No Data)     6CIT Screen 10/05/2018 09/14/2017  What Year? 0 points 0 points  What month? 0 points 0 points  What time? 0 points 0 points  Count back from 20 0 points 0 points  Months in reverse 0 points 0 points  Repeat phrase 0 points -  Total Score 0 -    Immunization History  Administered Date(s) Administered  . Influenza,inj,Quad PF,6+ Mos 11/15/2012, 10/30/2015  . Influenza-Unspecified 09/25/2013, 11/10/2014, 10/29/2016  . Pneumococcal Conjugate-13 03/08/2014  . Pneumococcal Polysaccharide-23 03/13/2015  . Td 01/26/2003  . Tdap 10/16/2015  . Zoster 12/12/2009    Qualifies for Shingles Vaccine? Discussed and patient will check with pharmacy for coverage.  Patient education handout provided    Screening Tests Health Maintenance  Topic Date Due  . INFLUENZA VACCINE  08/26/2018  . MAMMOGRAM  03/13/2020  . COLONOSCOPY  08/10/2022  . TETANUS/TDAP  10/15/2025  . DEXA SCAN  Completed  . Hepatitis C Screening  Completed  . PNA vac Low Risk Adult  Completed    Cancer Screenings: Lung: Low Dose CT Chest recommended if Age 77-80 years, 30 pack-year currently smoking OR have quit w/in 15years. Patient does not qualify. Breast:  Up to date on Mammogram? Yes; ordered for 2021 Up to date of Bone Density/Dexa? Yes; ordered patient would like to have in 2021 Colorectal: completed 08/09/12 with Dr. Ardis Hughs       Plan:    I have personally reviewed and addressed the Medicare Annual Wellness questionnaire and have noted the following in the patient's chart:  A. Medical and social history B. Use of alcohol, tobacco or illicit drugs  C. Current medications and supplements D. Functional ability and status E.  Nutritional status F.  Physical activity G. Advance directives H. List of other physicians I.  Hospitalizations, surgeries, and ER  visits in previous 12 months J.  [place] such as hearing and vision if needed, cognitive and depression L. Referrals, records requested, and appointments- mammogram and dexa ordered   In addition, I have reviewed and discussed with patient certain preventive protocols, quality metrics, and best practice recommendations. A written personalized care plan for preventive services as well as general preventive health recommendations were provided to patient.   Signed,  Denman George, LPN  Nurse Health Advisor   Nurse Notes: no additional

## 2018-10-05 NOTE — Patient Instructions (Addendum)
Ann Washington , Thank you for taking time to come for your Medicare Wellness Visit. I appreciate your ongoing commitment to your health goals. Please review the following plan we discussed and let me know if I can assist you in the future.   Screening recommendations/referrals: Colorectal Screening: up to date; last 08/09/12 with Dr. Ardis Hughs  Mammogram: up to date; ordered for next year  Bone Density:  Ordered   Vision and Dental Exams: Recommended annual ophthalmology exams for early detection of glaucoma and other disorders of the eye Recommended annual dental exams for proper oral hygiene  Vaccinations: Influenza vaccine: please notify us after you have received  Pneumococcal vaccine: up to date; last 03/13/15 Tdap vaccine: up to date; last 10/16/15 Shingles vaccine: Please call your insurance company to determine your out of pocket expense for the Shingrix vaccine. You may receive this vaccine at your local pharmacy.  Advanced directives: Please bring a copy of your POA (Power of Attorney) and/or Living Will to your next appointment.  Goals: Recommend to drink at least 6-8 8oz glasses of water per day.  Next appointment: Please schedule your Annual Wellness Visit with your Nurse Health Advisor in one year.  Preventive Care 35 Years and Older, Female Preventive care refers to lifestyle choices and visits with your health care provider that can promote health and wellness. What does preventive care include?  A yearly physical exam. This is also called an annual well check.  Dental exams once or twice a year.  Routine eye exams. Ask your health care provider how often you should have your eyes checked.  Personal lifestyle choices, including:  Daily care of your teeth and gums.  Regular physical activity.  Eating a healthy diet.  Avoiding tobacco and drug use.  Limiting alcohol use.  Practicing safe sex.  Taking low-dose aspirin every day if recommended by your health care  provider.  Taking vitamin and mineral supplements as recommended by your health care provider. What happens during an annual well check? The services and screenings done by your health care provider during your annual well check will depend on your age, overall health, lifestyle risk factors, and family history of disease. Counseling  Your health care provider may ask you questions about your:  Alcohol use.  Tobacco use.  Drug use.  Emotional well-being.  Home and relationship well-being.  Sexual activity.  Eating habits.  History of falls.  Memory and ability to understand (cognition).  Work and work Statistician.  Reproductive health. Screening  You may have the following tests or measurements:  Height, weight, and BMI.  Blood pressure.  Lipid and cholesterol levels. These may be checked every 5 years, or more frequently if you are over 77 years old.  Skin check.  Lung cancer screening. You may have this screening every year starting at age 1 if you have a 30-pack-year history of smoking and currently smoke or have quit within the past 15 years.  Fecal occult blood test (FOBT) of the stool. You may have this test every year starting at age 72.  Flexible sigmoidoscopy or colonoscopy. You may have a sigmoidoscopy every 5 years or a colonoscopy every 10 years starting at age 15.  Hepatitis C blood test.  Hepatitis B blood test.  Sexually transmitted disease (STD) testing.  Diabetes screening. This is done by checking your blood sugar (glucose) after you have not eaten for a while (fasting). You may have this done every 1-3 years.  Bone density scan. This is done  to screen for osteoporosis. You may have this done starting at age 38.  Mammogram. This may be done every 1-2 years. Talk to your health care provider about how often you should have regular mammograms. Talk with your health care provider about your test results, treatment options, and if necessary, the  need for more tests. Vaccines  Your health care provider may recommend certain vaccines, such as:  Influenza vaccine. This is recommended every year.  Tetanus, diphtheria, and acellular pertussis (Tdap, Td) vaccine. You may need a Td booster every 10 years.  Zoster vaccine. You may need this after age 66.  Pneumococcal 13-valent conjugate (PCV13) vaccine. One dose is recommended after age 75.  Pneumococcal polysaccharide (PPSV23) vaccine. One dose is recommended after age 8. Talk to your health care provider about which screenings and vaccines you need and how often you need them. This information is not intended to replace advice given to you by your health care provider. Make sure you discuss any questions you have with your health care provider. Document Released: 02/07/2015 Document Revised: 10/01/2015 Document Reviewed: 11/12/2014 Elsevier Interactive Patient Education  2017 [place] Prevention in the Home Falls can cause injuries. They can happen to people of all ages. There are many things you can do to make your home safe and to help prevent falls. What can I do on the outside of my home?  Regularly fix the edges of walkways and driveways and fix any cracks.  Remove anything that might make you trip as you walk through a door, such as a raised step or threshold.  Trim any bushes or trees on the path to your home.  Use bright outdoor lighting.  Clear any walking paths of anything that might make someone trip, such as rocks or tools.  Regularly check to see if handrails are loose or broken. Make sure that both sides of any steps have handrails.  Any raised decks and porches should have guardrails on the edges.  Have any leaves, snow, or ice cleared regularly.  Use sand or salt on walking paths during winter.  Clean up any spills in your garage right away. This includes oil or grease spills. What can I do in the bathroom?  Use night lights.  Install grab  bars by the toilet and in the tub and shower. Do not use towel bars as grab bars.  Use non-skid mats or decals in the tub or shower.  If you need to sit down in the shower, use a plastic, non-slip stool.  Keep the floor dry. Clean up any water that spills on the floor as soon as it happens.  Remove soap buildup in the tub or shower regularly.  Attach bath mats securely with double-sided non-slip rug tape.  Do not have throw rugs and other things on the floor that can make you trip. What can I do in the bedroom?  Use night lights.  Make sure that you have a light by your bed that is easy to reach.  Do not use any sheets or blankets that are too big for your bed. They should not hang down onto the floor.  Have a firm chair that has side arms. You can use this for support while you get dressed.  Do not have throw rugs and other things on the floor that can make you trip. What can I do in the kitchen?  Clean up any spills right away.  Avoid walking on wet floors.  Keep items  that you use a lot in easy-to-reach places.  If you need to reach something above you, use a strong step stool that has a grab bar.  Keep electrical cords out of the way.  Do not use floor polish or wax that makes floors slippery. If you must use wax, use non-skid floor wax.  Do not have throw rugs and other things on the floor that can make you trip. What can I do with my stairs?  Do not leave any items on the stairs.  Make sure that there are handrails on both sides of the stairs and use them. Fix handrails that are broken or loose. Make sure that handrails are as long as the stairways.  Check any carpeting to make sure that it is firmly attached to the stairs. Fix any carpet that is loose or worn.  Avoid having throw rugs at the top or bottom of the stairs. If you do have throw rugs, attach them to the floor with carpet tape.  Make sure that you have a light switch at the top of the stairs and the  bottom of the stairs. If you do not have them, ask someone to add them for you. What else can I do to help prevent falls?  Wear shoes that:  Do not have high heels.  Have rubber bottoms.  Are comfortable and fit you well.  Are closed at the toe. Do not wear sandals.  If you use a stepladder:  Make sure that it is fully opened. Do not climb a closed stepladder.  Make sure that both sides of the stepladder are locked into place.  Ask someone to hold it for you, if possible.  Clearly mark and make sure that you can see:  Any grab bars or handrails.  First and last steps.  Where the edge of each step is.  Use tools that help you move around (mobility aids) if they are needed. These include:  Canes.  Walkers.  Scooters.  Crutches.  Turn on the lights when you go into a dark area. Replace any light bulbs as soon as they burn out.  Set up your furniture so you have a clear path. Avoid moving your furniture around.  If any of your floors are uneven, fix them.  If there are any pets around you, be aware of where they are.  Review your medicines with your doctor. Some medicines can make you feel dizzy. This can increase your chance of falling. Ask your doctor what other things that you can do to help prevent falls. This information is not intended to replace advice given to you by your health care provider. Make sure you discuss any questions you have with your health care provider. Document Released: 11/07/2008 Document Revised: 06/19/2015 Document Reviewed: 02/15/2014 Elsevier Interactive Patient Education  2017 [employer].

## 2018-10-06 DIAGNOSIS — L821 Other seborrheic keratosis: Secondary | ICD-10-CM | POA: Diagnosis not present

## 2018-10-06 DIAGNOSIS — L720 Epidermal cyst: Secondary | ICD-10-CM | POA: Diagnosis not present

## 2018-10-06 DIAGNOSIS — L820 Inflamed seborrheic keratosis: Secondary | ICD-10-CM | POA: Diagnosis not present

## 2018-10-06 DIAGNOSIS — D1801 Hemangioma of skin and subcutaneous tissue: Secondary | ICD-10-CM | POA: Diagnosis not present

## 2018-10-06 DIAGNOSIS — D225 Melanocytic nevi of trunk: Secondary | ICD-10-CM | POA: Diagnosis not present

## 2018-10-06 DIAGNOSIS — L738 Other specified follicular disorders: Secondary | ICD-10-CM | POA: Diagnosis not present

## 2018-10-06 DIAGNOSIS — L814 Other melanin hyperpigmentation: Secondary | ICD-10-CM | POA: Diagnosis not present

## 2018-10-06 DIAGNOSIS — L918 Other hypertrophic disorders of the skin: Secondary | ICD-10-CM | POA: Diagnosis not present

## 2018-10-16 ENCOUNTER — Encounter: Payer: Self-pay | Admitting: Physical Medicine & Rehabilitation

## 2018-10-16 ENCOUNTER — Other Ambulatory Visit: Payer: Self-pay

## 2018-10-16 ENCOUNTER — Encounter: Payer: Medicare Other | Attending: Physical Medicine & Rehabilitation | Admitting: Physical Medicine & Rehabilitation

## 2018-10-16 VITALS — BP 128/78 | HR 63 | Temp 97.5°F | Ht 62.5 in | Wt 183.0 lb

## 2018-10-16 DIAGNOSIS — G249 Dystonia, unspecified: Secondary | ICD-10-CM | POA: Insufficient documentation

## 2018-10-16 DIAGNOSIS — G8929 Other chronic pain: Secondary | ICD-10-CM | POA: Insufficient documentation

## 2018-10-16 DIAGNOSIS — Z833 Family history of diabetes mellitus: Secondary | ICD-10-CM | POA: Diagnosis not present

## 2018-10-16 DIAGNOSIS — I10 Essential (primary) hypertension: Secondary | ICD-10-CM | POA: Diagnosis not present

## 2018-10-16 DIAGNOSIS — G248 Other dystonia: Secondary | ICD-10-CM

## 2018-10-16 DIAGNOSIS — Z803 Family history of malignant neoplasm of breast: Secondary | ICD-10-CM | POA: Diagnosis not present

## 2018-10-16 DIAGNOSIS — Z8249 Family history of ischemic heart disease and other diseases of the circulatory system: Secondary | ICD-10-CM | POA: Diagnosis not present

## 2018-10-16 DIAGNOSIS — Z8262 Family history of osteoporosis: Secondary | ICD-10-CM | POA: Insufficient documentation

## 2018-10-16 DIAGNOSIS — M25572 Pain in left ankle and joints of left foot: Secondary | ICD-10-CM | POA: Diagnosis not present

## 2018-10-16 NOTE — Patient Instructions (Signed)

## 2018-10-16 NOTE — Progress Notes (Addendum)
Botox Injection for spasticity using needle EMG guidance  Dilution: 50 Units/ml Indication: Severe spasticity which interferes with ADL,mobility and/or  hygiene and is unresponsive to medication management and other conservative care Informed consent was obtained after describing risks and benefits of the procedure with the patient. This includes bleeding, bruising, infection, excessive weakness, or medication side effects. A REMS form is on file and signed. Needle: 27g 1" needle electrode Number of units per muscle Left peroneus brevis muscle 50 unit x 1    All injections were done after obtaining appropriate EMG activity and after negative drawback for blood. The patient tolerated the procedure well. Post procedure instructions were given. A followup appointment was made.   50 U wastage

## 2018-11-16 ENCOUNTER — Telehealth: Payer: Self-pay

## 2018-11-16 NOTE — Progress Notes (Signed)
Phone [phone]   Subjective:  Virtual visit via Video note. Chief complaint: Chief Complaint  Patient presents with  . Follow-up  . rash on lip and tongue   This visit type was conducted due to national recommendations for restrictions regarding the COVID-19 Pandemic (e.g. social distancing).  This format is felt to be most appropriate for this patient at this time balancing risks to patient and risks to population by having him in for in person visit.  No physical exam was performed (except for noted visual exam or audio findings with Telehealth visits).    Our team/I connected with Margarette Asal Reinders at  4:40 PM EDT by a video enabled telemedicine application ([email or web address] or caregility through epic) and verified that I am speaking with the correct person using two identifiers.  Location patient: [place] Location provider: [facility], office Persons participating in the virtual visit:  patient  Our team/I discussed the limitations of evaluation and management by telemedicine and the availability of in person appointments. In light of current covid-19 pandemic, patient also understands that we are trying to protect them by minimizing in office contact if at all possible.  The patient expressed consent for telemedicine visit and agreed to proceed. Patient understands insurance will be billed.   ROS- no fever, chills. No trouble swallowing. No cough or congestion reported.    Past Medical History-  Patient Active Problem List   Diagnosis Date Noted  . CKD (chronic kidney disease), stage III 03/14/2014    Priority: Medium  . Glaucoma 03/08/2014    Priority: Medium  . Insomnia 03/08/2014    Priority: Medium  . Hyperglycemia 12/09/2009    Priority: Medium  . Hyperlipidemia 10/13/2006    Priority: Medium  . Major depression, recurrent, full remission ([place]) 10/13/2006    Priority: Medium  . Hypertension, essential 10/10/2006    Priority: Medium  . Normocytic anemia 09/12/2014   Priority: Low  . Stress incontinence, female 10/12/2011    Priority: Low  . Vaginal atrophy 05/27/2011    Priority: Low  . Obesity 10/11/2008    Priority: Low  . GERD- failed h2 blocker 08/25/2007    Priority: Low  . 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 amLODipine-valsartan (EXFORGE) 5-320 MG tablet TAKE 1 TABLET BY MOUTH EVERY DAY 90 tablet 3  . Apple Cider Vinegar 188 MG CAPS Take by mouth.    . Calcium Carbonate-Vit D-Min 600-400 MG-UNIT TABS Take by mouth daily.      Marland Kitchen escitalopram (LEXAPRO) 20 MG tablet TAKE 1/2 TABLETS BY MOUTH DAILY 45 tablet 2  . Multiple Vitamin (MULTIVITAMIN) tablet Take 1 tablet by mouth daily.      Marland Kitchen omeprazole (PRILOSEC) 20 MG capsule Take 20 mg by mouth daily.    . Red Yeast Rice Extract (RED YEAST RICE PO) Take 2 tablets by mouth at bedtime.    . rosuvastatin (CRESTOR) 5 MG tablet Take 1 tablet (5 mg total) by mouth once a week. 13 tablet 3  . timolol (TIMOPTIC) 0.5 % ophthalmic solution Place 1 drop into both eyes 2 (two) times daily.  0  . travoprost, benzalkonium, (TRAVATAN) 0.004 % ophthalmic solution Place 1 drop into both eyes at bedtime.      Marland Kitchen zolpidem (AMBIEN) 5 MG tablet TAKE 1 TABLET BY MOUTH EVERY DAY AT BEDTIME 30 tablet 4  . nystatin (MYCOSTATIN) 100000 UNIT/ML suspension Take 5 mLs (500,000 Units total) by mouth 4 (four)  times daily. 180 mL 0   No current facility-administered medications for this visit.      Objective:  BP 123/62   Pulse 79   Temp 97.8 F (36.6 C)  self reported vitals Gen: NAD, resting comfortably When patient sticks out tongue- whitish plaque noted  Lungs: nonlabored, normal respiratory rate  Skin: appears dry, no obvious rash     Assessment and Plan   # health maintenance- November 6th flu shot planned-she will message [place] when completed.  Will decline computer for now  Rash on lip and white coating on tongue S:  white coating on her tongue and then redness on the sides of tongue and tip of it with some bumps. Not sure if has coating on roof of mouth. No trouble swallowing. No sore throat. Symptoms started for 1.5 weeks or 2. No recent antibiotics. No recent inhalers or steroids have been used.   Started rosuvastatin almost 2 months ago.  Otherwise no new medications.Dry mouth in general-does wear a retainer at night  A/P: Appears to have thrush based on limited views today.  We opted to treat with nystatin for 7 to 9 days-if does not clear she will contact us and would consider clotrimazole troches.  No clear evidence that patient is immunocompromised or has had recent steroids or antibiotics-consider further investigation if fails to improve or recurs.  Recommended follow up: scheduled for march- happy to see her sooner if needed Future Appointments  Date Time Provider [place]  11/28/2018  1:30 PM Kirsteins, Luanna Salk, MD [facility]  03/29/2019  9:40 AM Yong Channel Brayton Mars, MD [id number]   Lab/Order associations:   ICD-10-CM   1. Thrush  B37.0    Meds ordered this encounter  Medications  . nystatin (MYCOSTATIN) 100000 UNIT/ML suspension    Sig: Take 5 mLs (500,000 Units total) by mouth 4 (four) times daily.    Dispense:  180 mL    Refill:  0    Return precautions advised.  Garret Reddish, MD

## 2018-11-16 NOTE — Telephone Encounter (Signed)
Called and spoke with pt and offered a virtual visit/office visit and pt accepted virtual. Transferred to front desk for scheduling.

## 2018-11-16 NOTE — Telephone Encounter (Signed)
Copied from [place] [id number]. Topic: General - Inquiry >> Nov 16, 2018  1:15 PM Alease Frame wrote: Reason for [id number] states she has a rash side of tongue and also a white coating on top of tongue . She wants to know if Dr Yong Channel can prescribe her something or give her a call back   Call back number [phone]

## 2018-11-16 NOTE — Patient Instructions (Addendum)
There are no preventive care reminders to display for this patient. Depression screen [facility] 2/9 10/05/2018 09/21/2018 05/30/2018  Decreased Interest 0 0 0  Down, Depressed, Hopeless 0 0 0  PHQ - 2 Score 0 0 0  Altered sleeping - 0 -  Tired, decreased energy - 0 -  Change in appetite - 0 -  Feeling bad or failure about yourself  - 0 -  Trouble concentrating - 0 -  Moving slowly or fidgety/restless - 0 -  Suicidal thoughts - 0 -  PHQ-9 Score - 0 -  Difficult doing work/chores - Not difficult at all -

## 2018-11-17 ENCOUNTER — Ambulatory Visit (INDEPENDENT_AMBULATORY_CARE_PROVIDER_SITE_OTHER): Payer: Medicare Other | Admitting: Family Medicine

## 2018-11-17 ENCOUNTER — Encounter: Payer: Self-pay | Admitting: Family Medicine

## 2018-11-17 VITALS — BP 123/62 | HR 79 | Temp 97.8°F

## 2018-11-17 DIAGNOSIS — B370 Candidal stomatitis: Secondary | ICD-10-CM

## 2018-11-17 MED ORDER — NYSTATIN 100000 UNIT/ML MT SUSP: 5.0000 mL | Freq: Four times a day (QID) | OROMUCOSAL | 0 refills | Status: DC

## 2018-11-28 ENCOUNTER — Encounter: Payer: Medicare Other | Attending: Physical Medicine & Rehabilitation | Admitting: Physical Medicine & Rehabilitation

## 2018-11-28 ENCOUNTER — Encounter: Payer: Self-pay | Admitting: Physical Medicine & Rehabilitation

## 2018-11-28 ENCOUNTER — Other Ambulatory Visit: Payer: Self-pay

## 2018-11-28 VITALS — BP 122/66 | HR 66 | Temp 97.7°F | Ht 62.0 in | Wt 182.0 lb

## 2018-11-28 DIAGNOSIS — Z833 Family history of diabetes mellitus: Secondary | ICD-10-CM | POA: Insufficient documentation

## 2018-11-28 DIAGNOSIS — G8929 Other chronic pain: Secondary | ICD-10-CM | POA: Diagnosis not present

## 2018-11-28 DIAGNOSIS — M25372 Other instability, left ankle: Secondary | ICD-10-CM

## 2018-11-28 DIAGNOSIS — Z803 Family history of malignant neoplasm of breast: Secondary | ICD-10-CM | POA: Diagnosis not present

## 2018-11-28 DIAGNOSIS — M25572 Pain in left ankle and joints of left foot: Secondary | ICD-10-CM | POA: Insufficient documentation

## 2018-11-28 DIAGNOSIS — I10 Essential (primary) hypertension: Secondary | ICD-10-CM | POA: Diagnosis not present

## 2018-11-28 DIAGNOSIS — Z8262 Family history of osteoporosis: Secondary | ICD-10-CM | POA: Insufficient documentation

## 2018-11-28 DIAGNOSIS — G249 Dystonia, unspecified: Secondary | ICD-10-CM | POA: Diagnosis not present

## 2018-11-28 DIAGNOSIS — Z8249 Family history of ischemic heart disease and other diseases of the circulatory system: Secondary | ICD-10-CM | POA: Insufficient documentation

## 2018-11-28 NOTE — Progress Notes (Signed)
Subjective:    Patient ID: Ann Washington, female    DOB: Jun 10, 1950, 68 y.o.   MRN: [id number]  HPI  68 year old female with history of focal dystonia after left ankle surgery who was referred by podiatry for dystonia.  Works at [employer] ~15 hr per week  10/17/2018 Botox injection Left peroneus brevis muscle 50 unit x 1    Pt doesn't feel like Botox helped much, her primary complaint is going up steps and having problems with feeling like she cannot trust the ankle. She does not feel like the ankle turning out is much of an issue for her. She states that she would like another opinion on this.  She requests a referral  Pain Inventory Average Pain 1 Pain Right Now 0 My pain is no paiun  In the last 24 hours, has pain interfered with the following? General activity 0 Relation with others 0 Enjoyment of life 8 What TIME of day is your pain at its worst? evening Sleep (in general) Good  Pain is worse with: walking and bending Pain improves with: pacing activities Relief from Meds: 0  Mobility walk without assistance ability to climb steps?  no do you drive?  yes  Function employed # of hrs/week 15  Neuro/Psych No problems in this area  Prior Studies Any changes since last visit?  no  Physicians involved in your care Any changes since last visit?  no   Family History  Problem Relation Age of Onset  . Diabetes Sister   . Hypertension Sister   . Alzheimer's disease Mother   . Hip fracture Mother   . Osteoporosis Mother   . Deep vein thrombosis Father   . Heart disease Father        MI 7s, nonsmoker  . Hypertension Brother   . Diabetes Maternal Grandmother   . Stroke Maternal Grandfather   . Depression Paternal Grandmother   . Suicidality Paternal Grandfather   . Depression Paternal Grandfather   . Breast cancer Sister   . Arthritis Sister   . Colon cancer Neg Hx    Social History   Socioeconomic History  . Marital status: Married   Spouse name: Not on file  . Number of children: Not on file  . Years of education: Not on file  . Highest education level: Not on file  Occupational History  . Not on file  Social Needs  . Financial resource strain: Not on file  . Food insecurity    Worry: Not on file    Inability: Not on file  . Transportation needs    Medical: Not on file    Non-medical: Not on file  Tobacco Use  . Smoking status: Never Smoker  . Smokeless tobacco: Never Used  Substance and Sexual Activity  . Alcohol use: No  . Drug use: No  . Sexual activity: Yes    Birth control/protection: Surgical  Lifestyle  . Physical activity    Days per week: Not on file    Minutes per session: Not on file  . Stress: Not on file  Relationships  . Social Herbalist on phone: Not on file    Gets together: Not on file    Attends religious service: Not on file    Active member of club or organization: Not on file    Attends meetings of clubs or organizations: Not on file    Relationship status: Not on file  Other Topics Concern  . Not on  file  Social History Narrative   Married (husband patient at [facility]). 3 children. 7 grandkids.       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 CATARACT EXTRACTION     late 2019  . COLONOSCOPY    . COLPOSCOPY     age 4  . GYNECOLOGIC CRYOSURGERY     age 67  . HAMMER TOE SURGERY    . TUBAL LIGATION     Past Medical History:  Diagnosis Date  . CIN I (cervical intraepithelial neoplasia I)    around age 44  . Depression   . 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 GERD (gastroesophageal reflux disease)   . Glaucoma   . Hyperlipidemia   . Hypertension   . Snoring 03/08/2008   Tested 2007 per patient-unrevealing for sleep apnea     BP 122/66   Pulse  66   Temp 97.7 F (36.5 C)   Ht 5\' 2"  (1.575 m)   Wt 182 lb (82.6 kg)   SpO2 95%   BMI 33.29 kg/m   Opioid Risk Score:   Fall Risk Score:  `1  Depression screen PHQ 2/9  Depression screen [facility] 2/9 10/05/2018 09/21/2018 05/30/2018 03/27/2018 03/23/2018 09/14/2017 03/18/2017  Decreased Interest 0 0 0 0 0 0 0  Down, Depressed, Hopeless 0 0 0 0 0 0 0  PHQ - 2 Score 0 0 0 0 0 0 0  Altered sleeping - 0 - 0 0 0 0  Tired, decreased energy - 0 - 0 0 0 0  Change in appetite - 0 - 0 0 0 0  Feeling bad or failure about yourself  - 0 - 0 0 0 0  Trouble concentrating - 0 - 0 0 0 0  Moving slowly or fidgety/restless - 0 - 0 0 0 0  Suicidal thoughts - 0 - 0 0 0 0  PHQ-9 Score - 0 - 0 0 0 0  Difficult doing work/chores - Not difficult at all - Not difficult at all Not difficult at all - -    Review of Systems  Constitutional: Negative.   HENT: Negative.   Eyes: Negative.   Respiratory: Negative.   Cardiovascular: Negative.   Gastrointestinal: Negative.   Endocrine: Negative.   Genitourinary: Negative.   Musculoskeletal: Positive for gait problem.  Skin: Negative.   Allergic/Immunologic: Negative.   Hematological: Negative.   Psychiatric/Behavioral: Negative.   All other systems reviewed and are negative.      Objective:   Physical Exam Vitals signs and nursing note reviewed.  Constitutional:      Appearance: Normal appearance. She is obese.  Eyes:     Extraocular Movements: Extraocular movements intact.     Conjunctiva/sclera: Conjunctivae normal.     Pupils: Pupils are equal, round, and reactive to light.  Skin:    General: Skin is warm and dry.  Neurological:     Mental Status: She is alert and oriented to person, place, and time.  Psychiatric:        Mood and Affect: Mood normal.        Behavior: Behavior normal.     No evidence of foot eversion on the left side. She ambulates with a plantigrade foot.  No evidence of toe drag  or knee instability. The patient has mild pain  with ankle passive dorsiflexion plantarflexion inversion and eversion. There is no increased tone in the foot evertor muscles Patient has approximately 75% of normal range of motion in the left ankle dorsiflexor plantar flexor inverters and everters     Assessment & Plan:  #1.  Chronic ankle instability.  She does not have pain as a primary complaint.  While the Botox appears to have helped with the foot eversion and there was EMG evidence of increased activity in the peroneus brevis muscle the patient does not feel like the Botox was of much benefit. The Botox should wear off in approximately 6 weeks at which time she can decide whether she would like to pursue another injection. We discussed that her primary complaint of going up and down steps is likely due to feelings of ankle instability.  I offered a referral to outpatient physical therapy and she declined this.  We discussed the use of a BAP's board for proprioception training.  She is concerned that there is something structurally wrong with her ankle and would like another opinion on this.  Made referral to Dr. Doran Durand at the patient's request, he did surgery on her husband.  Physical medicine rehab follow-up on as-needed basis if the patient would like another botulinum toxin injection for her left ankle focal dystonia in the peroneus brevis muscle

## 2018-11-28 NOTE — Patient Instructions (Signed)
BCG Wobble Board sold at [employer] ~25.00  Chronic Ankle Instability Rehab After Surgery Ask your health care provider which exercises are safe for you. Do exercises exactly as told by your health care provider and adjust them as directed. It is normal to feel mild stretching, pulling, tightness, or discomfort as you do these exercises. Stop right away if you feel sudden pain or your pain gets worse. Do not begin these exercises until told by your health care provider. Stretching and range-of-motion exercises Ankle dorsiflexion and plantar flexion  1. Sit with your left / right knee straight or bent. Do not rest your foot on anything. 2. Flex your left / right ankle to tilt the top of your foot toward your shin (dorsiflexion). 3. Hold this position for __________ seconds. 4. Point your toes downward to tilt the top of your foot away from your shin (plantar flexion). 5. Hold this position for __________ seconds. Repeat __________ times. Complete this exercise __________ times a day. Ankle alphabet  1. Sit with your left / right leg supported at the lower leg. ? Do not rest your foot on anything. ? Make sure your foot has room to move freely. 2. Think of your left / right foot as a paintbrush, and move your foot to trace each letter of the alphabet in the air. Keep your hip and knee still while you trace. Make the letters as large as you can without feeling discomfort. 3. Trace every letter from A to Z. Repeat __________ times. Complete this exercise __________ times a day. Gastroc and soleus stretch  This exercise is also called a calf stretch. 1. Sit on the floor with your left / right leg extended. 2. Loop a belt or towel around the ball of your left / right foot. The ball of your foot is on the walking surface, right under your toes. 3. Keep your left / right ankle and foot relaxed and keep your knee straight while you use the belt or towel to pull your foot and ankle toward you. You  should feel a gentle stretch behind your calf or knee. 4. Hold this position for __________ seconds. Repeat __________ times. Complete this exercise __________ times a day. Active-assisted ankle dorsiflexion  1. Sit on a chair that is placed on a non-carpeted surface. 2. Place your left / right foot on the floor, directly under your knee. Extend your other leg for support. 3. Keeping your heel down, slide your left / right foot back toward the chair until you feel a stretch at your ankle or calf. If you do not feel a stretch, slide your buttocks forward to the edge of the chair. 4. Hold this stretch for __________ seconds. Repeat __________ times. Complete this exercise __________ times a day. Strengthening exercises Ankle dorsiflexion with band  1. Secure a rubber exercise band or tube to an object, like a table leg, that will not move if it is pulled on. Secure the other end around your left / right foot. 2. Sit on the floor facing the object with your left / right foot extended. The band or tube should be slightly tense when your foot is relaxed. 3. Slowly flex your left / right ankle and toes to bring your foot toward you (dorsiflexion). 4. Hold this position for __________ seconds. 5. Let the band or tube slowly pull your foot back to the starting position. Repeat __________ times. Complete this exercise __________ times a day. Ankle plantar flexion with band  1.  Sit on the floor with your left / right leg extended. 2. Loop a rubber exercise band or tube around the ball of your left / right foot. The ball of your foot is on the walking surface, right under your toes. The band or tube should be slightly tense when your foot is relaxed. 3. Slowly point your toes downward, pushing them away from you (plantar flexion). 4. Hold this position for __________ seconds. 5. Let the band or tube slowly pull your foot back to the starting position. Repeat __________ times. Complete this exercise  __________ times a day. Ankle eversion with band 1. Secure one end of an exercise band or tubing to a fixed object, such as a table leg or a pole, that will stay still when the band is pulled. 2. Secure the other end of the band around the middle of your left / right foot. 3. Sit on the floor, facing the fixed object. The band should be slightly tense when your foot is relaxed. 4. Make fists with your hands and put them between your knees. This will focus your strengthening at your ankle. 5. Leading with your little toe, slowly push your banded foot outward, away from your body (eversion). The band or tube should be adding resistance. 6. Hold this position for __________ seconds. 7. Slowly return your foot to the starting position while controlling the tension in the band. Repeat __________ times. Complete this exercise __________ times a day. Ankle inversion with band 1. Secure one end of an exercise band or tubing to a fixed object, such as a table leg or a pole, that will stay still when the band is pulled. 2. Secure the other end of the band around your left / right foot, near your toes. 3. Sit on the floor, facing the fixed object. The band should be slightly tense when your foot is relaxed. 4. Make fists with your hands and put them between your knees. This will focus your strengthening at your ankle. 5. Leading with your big toe, slowly pull your banded foot inward, toward your body (inversion). The band or tube should be adding resistance. 6. Hold this position for __________ seconds. 7. Let the band or tube slowly pull your foot back to the starting position. Repeat __________ times. Complete this exercise __________ times a day. Towel curls  1. Sit in a chair on a non-carpeted surface, and put your feet on the floor. 2. Place a towel in front of your feet. If told by your health care provider, add __________ to the end of the towel. 3. Keeping your heel on the floor, put your left /  right foot on the towel. 4. Pull the towel toward you by grabbing the towel with your toes and curling them under. Keep your heel on the floor. Repeat __________ times. Complete this exercise __________ times a day. This information is not intended to replace advice given to you by your health care provider. Make sure you discuss any questions you have with your health care provider. Document Released: 05/05/2015 Document Revised: 05/04/2018 Document Reviewed: 10/25/2017 Elsevier Patient Education  2020 [employer].

## 2018-12-02 DIAGNOSIS — Z23 Encounter for immunization: Secondary | ICD-10-CM | POA: Diagnosis not present

## 2018-12-07 ENCOUNTER — Other Ambulatory Visit: Payer: Self-pay | Admitting: Family Medicine

## 2018-12-08 DIAGNOSIS — M25572 Pain in left ankle and joints of left foot: Secondary | ICD-10-CM | POA: Diagnosis not present

## 2018-12-08 DIAGNOSIS — M79672 Pain in left foot: Secondary | ICD-10-CM | POA: Diagnosis not present

## 2018-12-08 DIAGNOSIS — M19079 Primary osteoarthritis, unspecified ankle and foot: Secondary | ICD-10-CM | POA: Insufficient documentation

## 2018-12-08 DIAGNOSIS — H401132 Primary open-angle glaucoma, bilateral, moderate stage: Secondary | ICD-10-CM | POA: Diagnosis not present

## 2018-12-19 ENCOUNTER — Other Ambulatory Visit: Payer: Self-pay | Admitting: Family Medicine

## 2018-12-19 NOTE — Telephone Encounter (Signed)
Last app 11/17/2018 Last script 07/24/2018 #30 4 rf  Follow up 03/29/2019

## 2018-12-28 DIAGNOSIS — M25672 Stiffness of left ankle, not elsewhere classified: Secondary | ICD-10-CM | POA: Diagnosis not present

## 2019-01-03 DIAGNOSIS — M25672 Stiffness of left ankle, not elsewhere classified: Secondary | ICD-10-CM | POA: Diagnosis not present

## 2019-02-01 ENCOUNTER — Other Ambulatory Visit: Payer: Self-pay | Admitting: Family Medicine

## 2019-03-15 ENCOUNTER — Ambulatory Visit: Payer: Medicare Other

## 2019-03-15 ENCOUNTER — Other Ambulatory Visit: Payer: Medicare Other

## 2019-03-28 ENCOUNTER — Other Ambulatory Visit: Payer: Self-pay

## 2019-03-29 ENCOUNTER — Encounter: Payer: Self-pay | Admitting: Family Medicine

## 2019-03-29 ENCOUNTER — Ambulatory Visit (INDEPENDENT_AMBULATORY_CARE_PROVIDER_SITE_OTHER): Payer: Medicare Other | Admitting: Family Medicine

## 2019-03-29 VITALS — BP 130/80 | HR 55 | Temp 98.3°F | Ht 62.0 in | Wt 182.2 lb

## 2019-03-29 DIAGNOSIS — N183 Chronic kidney disease, stage 3 unspecified: Secondary | ICD-10-CM

## 2019-03-29 DIAGNOSIS — F3342 Major depressive disorder, recurrent, in full remission: Secondary | ICD-10-CM

## 2019-03-29 DIAGNOSIS — R739 Hyperglycemia, unspecified: Secondary | ICD-10-CM

## 2019-03-29 DIAGNOSIS — G4700 Insomnia, unspecified: Secondary | ICD-10-CM

## 2019-03-29 DIAGNOSIS — E785 Hyperlipidemia, unspecified: Secondary | ICD-10-CM

## 2019-03-29 DIAGNOSIS — I10 Essential (primary) hypertension: Secondary | ICD-10-CM | POA: Diagnosis not present

## 2019-03-29 LAB — POCT GLYCOSYLATED HEMOGLOBIN (HGB A1C): Hemoglobin A1C: 5.5 % (ref 4.0–5.6)

## 2019-03-29 LAB — COMPREHENSIVE METABOLIC PANEL
ALT: 13 U/L (ref 0–35)
AST: 18 U/L (ref 0–37)
Albumin: 4.3 g/dL (ref 3.5–5.2)
Alkaline Phosphatase: 54 U/L (ref 39–117)
BUN: 14 mg/dL (ref 6–23)
CO2: 28 mEq/L (ref 19–32)
Calcium: 9.8 mg/dL (ref 8.4–10.5)
Chloride: 104 mEq/L (ref 96–112)
Creatinine, Ser: 0.76 mg/dL (ref 0.40–1.20)
GFR: 75.18 mL/min (ref >60.00)
Glucose, Bld: 104 mg/dL — ABNORMAL HIGH (ref 70–99)
Potassium: 4.4 mEq/L (ref 3.5–5.1)
Sodium: 138 mEq/L (ref 135–145)
Total Bilirubin: 0.8 mg/dL (ref 0.2–1.2)
Total Protein: 7.7 g/dL (ref 6.0–8.3)

## 2019-03-29 LAB — LDL CHOLESTEROL, DIRECT: Direct LDL: 151 mg/dL

## 2019-03-29 LAB — HEMOGLOBIN A1C: Hgb A1c MFr Bld: 6.1 % (ref 4.6–6.5)

## 2019-03-29 MED ORDER — ESCITALOPRAM OXALATE 10 MG PO TABS: 10.0000 mg | ORAL_TABLET | Freq: Every day | ORAL | 3 refills | Status: DC

## 2019-03-29 MED ORDER — ZOLPIDEM TARTRATE 5 MG PO TABS: ORAL_TABLET | ORAL | 5 refills | Status: DC

## 2019-03-29 MED ORDER — ROSUVASTATIN CALCIUM 5 MG PO TABS: 5.0000 mg | ORAL_TABLET | ORAL | 3 refills | Status: DC

## 2019-03-29 NOTE — Progress Notes (Signed)
Phone [phone] In person visit   Subjective:   Ann Washington is a 69 y.o. year old very pleasant female patient who presents for/with See problem oriented charting Chief Complaint  Patient presents with  . Hypertension  . Hyperlipidemia    This visit occurred during the SARS-CoV-2 public health emergency.  Safety protocols were in place, including screening questions prior to the visit, additional usage of staff PPE, and extensive cleaning of exam room while observing appropriate contact time as indicated for disinfecting solutions.   Past Medical History-  Patient Active Problem List   Diagnosis Date Noted  . CKD (chronic kidney disease), stage III 03/14/2014    Priority: Medium  . Glaucoma 03/08/2014    Priority: Medium  . Insomnia 03/08/2014    Priority: Medium  . Hyperglycemia 12/09/2009    Priority: Medium  . Hyperlipidemia 10/13/2006    Priority: Medium  . Major depression, recurrent, full remission ([place]) 10/13/2006    Priority: Medium  . Hypertension, essential 10/10/2006    Priority: Medium  . Normocytic anemia 09/12/2014    Priority: Low  . Stress incontinence, female 10/12/2011    Priority: Low  . Vaginal atrophy 05/27/2011    Priority: Low  . Obesity 10/11/2008    Priority: Low  . GERD- failed h2 blocker 08/25/2007    Priority: Low  . Osteoarthritis of ankle and foot 12/08/2018  . 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 amLODipine-valsartan (EXFORGE) 5-320 MG tablet TAKE 1 TABLET BY MOUTH EVERY DAY 90 tablet 3  . Apple Cider Vinegar 188 MG CAPS Take by mouth.    . escitalopram (LEXAPRO) 10 MG tablet Take 1 tablet (10 mg total) by mouth daily. 90 tablet 3  . Multiple Vitamin (MULTIVITAMIN) tablet Take 1 tablet by mouth daily.      Marland Kitchen omeprazole (PRILOSEC) 20 MG capsule Take 20 mg by mouth daily.    . Red Yeast Rice Extract (RED YEAST RICE PO) Take 2 tablets  by mouth at bedtime.    . rosuvastatin (CRESTOR) 5 MG tablet Take 1 tablet (5 mg total) by mouth once a week. 13 tablet 3  . timolol (TIMOPTIC) 0.5 % ophthalmic solution Place 1 drop into both eyes 2 (two) times daily.  0  . travoprost, benzalkonium, (TRAVATAN) 0.004 % ophthalmic solution Place 1 drop into both eyes at bedtime.      Marland Kitchen zolpidem (AMBIEN) 5 MG tablet TAKE 1 TABLET BY MOUTH EVERYDAY AT BEDTIME 30 tablet 5   No current facility-administered medications for this visit.     Objective:  BP 130/80   Pulse (!) 55   Temp 98.3 F (36.8 C) (Temporal)   Ht 5\' 2"  (1.575 m)   Wt 182 lb 3.2 oz (82.6 kg)   SpO2 96%   BMI 33.32 kg/m  Gen: NAD, resting comfortably CV: RRR no murmurs rubs or gallops Lungs: CTAB no crackles, wheeze, rhonchi Ext: no edema Skin: warm, dry     Assessment and Plan   #Hypertension/CKD stage III S: Compliant with amlodipine-valsartan 5-320 mg.  Knows to avoid NSAIDs-have discussed Celebrex prescribed by Dr. Milinda Pointer.  Valsartan is protective in case there is a proteinuric element. A/P: Blood pressure is well controlled.  Hopefully kidney function is stable -We will update CMP today  #Hyperlipidemia S: Poorly controlled on red yeast rice and apple cider vinegar.She was started on rosuvastatin 5 mg weekly. Myalgias on pravastatin in the past. Has  been exercising 3x per week 21min cardio. Lab Results  Component Value Date   CHOL 261 (H) 09/21/2018   HDL 52.40 09/21/2018   LDLCALC 179 (H) 09/21/2018   LDLDIRECT 166.0 03/18/2017   TRIG 146.0 09/21/2018   CHOLHDL 5 09/21/2018   A/P: Poor control last check-I told her I was encouraged by her exercise regimen.  We also discussed plant-based nutrition and nutrition facts.org is a good resource for improving cholesterol from dietary perspective   #Hyperglycemia S: Increased risk of diabetes.  No current medication.  Lifestyle modification only.a1c check in office today   Lab Results  Component Value Date    HGBA1C 5.5 03/29/2019   HGBA1C 6.3 09/21/2018   HGBA1C 6.1 09/15/2017  A/P: Worsening A1c at last visit but down to 5.5 today per POC machine- her regular exercise routine is helping.  She is not interested in adding Metformin at this time.  Once again plant-based nutrition may be beneficial for her and referred to nutrition facts.org   #GERD S: Has been well controlled on Prilosec. zantac was not helpful for her.Prilosec medication working without problems   A/P: PPI not ideal with chronic kidney disease but she failed H2 blocker-we will continue current medication and work on weight loss  #Depression in full remission/insomnia  S: Compliant with Lexapro 10 mg.  PHQ 9 of 0. Denies any problems or suicidal thoughts. Medication working.   Insomnia controlled on ambien A/P: Stable. Continue current medications except change to 10mg  pill instead of 20mg  and splitting in half.  - continue ambien for sleep  # arthritis- continued issues with arthritis in left foot- has had a lot of workup with Dr. Doran Durand but continues to have issues. Surgical options are not the best for her so has opted to simply deal with the pain. voltaren helps her some.   Recommended follow up: Return in about 6 months (around 09/29/2019) for follow up- or sooner if needed. Future Appointments  Date Time Provider [place]  05/31/2019  8:30 AM [facility] MM 2 [facility]  05/31/2019  9:00 AM [facility] DX DEXA 1 [facility]    Lab/Order associations:   ICD-10-CM   1. Hypertension, essential  I10 Comprehensive metabolic panel  2. Hyperlipidemia, unspecified hyperlipidemia type  E78.5   3. Stage 3 chronic kidney disease, unspecified whether stage 3a or 3b CKD  N18.30   4. Hyperglycemia  R73.9 POCT HgB A1C    Hemoglobin A1c  5. Major depression, recurrent, full remission ([place])  F33.42   6. Insomnia, unspecified type  G47.00     Meds ordered this encounter  Medications  . escitalopram (LEXAPRO) 10 MG  tablet    Sig: Take 1 tablet (10 mg total) by mouth daily.    Dispense:  90 tablet    Refill:  3  . rosuvastatin (CRESTOR) 5 MG tablet    Sig: Take 1 tablet (5 mg total) by mouth once a week.    Dispense:  13 tablet    Refill:  3  . zolpidem (AMBIEN) 5 MG tablet    Sig: TAKE 1 TABLET BY MOUTH EVERYDAY AT BEDTIME    Dispense:  30 tablet    Refill:  5    This request is for a new prescription for a controlled substance as required by Federal/State law.   Return precautions advised.  Garret Reddish, MD

## 2019-03-29 NOTE — Patient Instructions (Addendum)
[email or web address]  [email or web address]  Great job getting A1C down! Lets keep working on getting sugar down further.   Please stop by lab before you go If you do not have mychart- we will call you about results within 5 business days of [place] receiving them.  If you have mychart- we will send your results within 3 business days of [place] receiving them.  If abnormal or we want to clarify a result, we will call or mychart you to make sure you receive the message.  If you have questions or concerns or don't hear within 5 business days, please send [place] a message or call us.   Recommended follow up: Return in about 6 months (around 09/29/2019) for follow up- or sooner if needed.

## 2019-04-30 ENCOUNTER — Ambulatory Visit (INDEPENDENT_AMBULATORY_CARE_PROVIDER_SITE_OTHER): Payer: Medicare Other | Admitting: Podiatry

## 2019-04-30 ENCOUNTER — Other Ambulatory Visit: Payer: Self-pay

## 2019-04-30 DIAGNOSIS — G5792 Unspecified mononeuropathy of left lower limb: Secondary | ICD-10-CM

## 2019-04-30 DIAGNOSIS — M19072 Primary osteoarthritis, left ankle and foot: Secondary | ICD-10-CM

## 2019-05-03 NOTE — Progress Notes (Signed)
   Subjective: 69 y.o. female presenting today with a chief complaint of intermittent left heel pain. Standing and walking increases the pain. She has been wearing Brooks shoes which help ease the pain. Patient is here for further evaluation and treatment.   Past Medical History:  Diagnosis Date  . CIN I (cervical intraepithelial neoplasia I)    around age 28  . Depression   . 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 GERD (gastroesophageal reflux disease)   . Glaucoma   . Hyperlipidemia   . Hypertension   . Snoring 03/08/2008   Tested 2007 per patient-unrevealing for sleep apnea       Objective: Physical Exam General: The patient is alert and oriented x3 in no acute distress.  Dermatology: Skin is warm, dry and supple bilateral lower extremities. Negative for open lesions or macerations bilateral.   Vascular: Dorsalis Pedis and Posterior Tibial pulses palpable bilateral.  Capillary fill time is immediate to all digits.  Neurological: Epicritic and protective threshold intact bilateral.   Musculoskeletal: Tenderness to palpation to the plantar aspect of the left heel along the plantar fascia. All other joints range of motion within normal limits bilateral. Strength 5/5 in all groups bilateral.   Radiographic exam: Normal osseous mineralization. Joint spaces preserved. No fracture/dislocation/boney destruction. No other soft tissue abnormalities or radiopaque foreign bodies.   Assessment: 1. Left heel neuritis  2. DJD left ankle   Plan of Care:  1. Patient evaluated. Xrays reviewed.   2. Appointment with Pedorthist for custom molded orthotics.  3. Continue wearing Brooks shoes.  4. Return to clinic as needed.      Edrick Kins, DPM [facility]  Dr. Edrick Kins, DPM    [address], [place] [address]                Office [phone]  Fax [phone]

## 2019-05-11 ENCOUNTER — Other Ambulatory Visit: Payer: Medicare Other | Admitting: Orthotics

## 2019-05-15 ENCOUNTER — Ambulatory Visit (INDEPENDENT_AMBULATORY_CARE_PROVIDER_SITE_OTHER): Payer: Medicare Other | Admitting: Orthotics

## 2019-05-15 ENCOUNTER — Other Ambulatory Visit: Payer: Self-pay

## 2019-05-15 DIAGNOSIS — M19072 Primary osteoarthritis, left ankle and foot: Secondary | ICD-10-CM

## 2019-05-15 DIAGNOSIS — M79671 Pain in right foot: Secondary | ICD-10-CM

## 2019-05-15 DIAGNOSIS — G5792 Unspecified mononeuropathy of left lower limb: Secondary | ICD-10-CM

## 2019-05-15 DIAGNOSIS — M76822 Posterior tibial tendinitis, left leg: Secondary | ICD-10-CM

## 2019-05-15 NOTE — Progress Notes (Signed)
Patient seen today for CMFO casting to address primarily neuritis left heel; we also discussed bracing options to address DJD left.

## 2019-05-29 ENCOUNTER — Other Ambulatory Visit: Payer: Self-pay | Admitting: Family Medicine

## 2019-05-29 DIAGNOSIS — E2839 Other primary ovarian failure: Secondary | ICD-10-CM

## 2019-05-29 DIAGNOSIS — Z13820 Encounter for screening for osteoporosis: Secondary | ICD-10-CM

## 2019-05-31 ENCOUNTER — Other Ambulatory Visit: Payer: Self-pay | Admitting: Family Medicine

## 2019-05-31 ENCOUNTER — Ambulatory Visit
Admission: RE | Admit: 2019-05-31 | Discharge: 2019-05-31 | Disposition: A | Discharge status: Home / Self Care | Payer: Medicare Other | Source: Ambulatory Visit | Attending: Family Medicine | Admitting: Family Medicine

## 2019-05-31 ENCOUNTER — Other Ambulatory Visit: Payer: Self-pay

## 2019-05-31 DIAGNOSIS — Z13820 Encounter for screening for osteoporosis: Secondary | ICD-10-CM

## 2019-05-31 DIAGNOSIS — E2839 Other primary ovarian failure: Secondary | ICD-10-CM

## 2019-05-31 DIAGNOSIS — Z1231 Encounter for screening mammogram for malignant neoplasm of breast: Secondary | ICD-10-CM | POA: Diagnosis not present

## 2019-05-31 DIAGNOSIS — Z780 Asymptomatic menopausal state: Secondary | ICD-10-CM | POA: Diagnosis not present

## 2019-06-05 ENCOUNTER — Ambulatory Visit: Payer: Medicare Other | Admitting: Orthotics

## 2019-06-05 ENCOUNTER — Other Ambulatory Visit: Payer: Self-pay

## 2019-06-05 DIAGNOSIS — M6788 Other specified disorders of synovium and tendon, other site: Secondary | ICD-10-CM

## 2019-06-05 DIAGNOSIS — M76822 Posterior tibial tendinitis, left leg: Secondary | ICD-10-CM

## 2019-06-05 NOTE — Progress Notes (Signed)
Patient came in today to pick up custom made foot orthotics.  The goals were accomplished and the patient reported no dissatisfaction with said orthotics.  Patient was advised of breakin period and how to report any issues. 

## 2019-06-08 DIAGNOSIS — H401132 Primary open-angle glaucoma, bilateral, moderate stage: Secondary | ICD-10-CM | POA: Diagnosis not present

## 2019-08-22 ENCOUNTER — Telehealth: Payer: Self-pay | Admitting: Family Medicine

## 2019-08-22 NOTE — Progress Notes (Signed)
  Chronic Care Management   Outreach Note  08/22/2019 Name: Ann Washington MRN: [id number] DOB: 06/11/1950  Referred by: Marin Olp, MD Reason for referral : No chief complaint on file.   An unsuccessful telephone outreach was attempted today. The patient was referred to the pharmacist for assistance with care management and care coordination.   Follow Up Plan:   [name] Upstream Scheduler

## 2019-08-27 ENCOUNTER — Telehealth: Payer: Self-pay | Admitting: Family Medicine

## 2019-08-27 NOTE — Progress Notes (Signed)
  Chronic Care Management   Note  08/27/2019 Name: GRENDA LORA MRN: [id number] DOB: 10/16/1950  Skyylar Kopf Geraldo is a 69 y.o. year old female who is a primary care patient of Marin Olp, MD. I reached out to Adora Fridge by phone today in response to a referral sent by Ms. Margarette Asal Burbage's PCP, Marin Olp, MD.   Ms. Woolworth was given information about Chronic Care Management services today including:  1. CCM service includes personalized support from designated clinical staff supervised by her physician, including individualized plan of care and coordination with other care providers 2. 24/7 contact phone numbers for assistance for urgent and routine care needs. 3. Service will only be billed when office clinical staff spend 20 minutes or more in a month to coordinate care. 4. Only one practitioner may furnish and bill the service in a calendar month. 5. The patient may stop CCM services at any time (effective at the end of the month) by phone call to the office staff.   Patient agreed to services and verbal consent obtained.   Follow up plan:   [name] Upstream Scheduler

## 2019-09-17 DIAGNOSIS — Z124 Encounter for screening for malignant neoplasm of cervix: Secondary | ICD-10-CM | POA: Diagnosis not present

## 2019-09-17 DIAGNOSIS — Z6833 Body mass index (BMI) 33.0-33.9, adult: Secondary | ICD-10-CM | POA: Diagnosis not present

## 2019-09-17 LAB — HM PAP SMEAR

## 2019-09-21 ENCOUNTER — Encounter: Payer: Self-pay | Admitting: Family Medicine

## 2019-09-24 ENCOUNTER — Telehealth: Payer: Medicare Other

## 2019-09-25 ENCOUNTER — Other Ambulatory Visit: Payer: Self-pay

## 2019-09-25 DIAGNOSIS — I10 Essential (primary) hypertension: Secondary | ICD-10-CM

## 2019-09-25 DIAGNOSIS — R739 Hyperglycemia, unspecified: Secondary | ICD-10-CM

## 2019-09-25 DIAGNOSIS — N183 Chronic kidney disease, stage 3 unspecified: Secondary | ICD-10-CM

## 2019-10-02 ENCOUNTER — Ambulatory Visit: Payer: Medicare Other

## 2019-10-02 ENCOUNTER — Telehealth: Payer: Medicare Other

## 2019-10-02 ENCOUNTER — Telehealth: Payer: Self-pay

## 2019-10-02 ENCOUNTER — Other Ambulatory Visit: Payer: Self-pay

## 2019-10-02 DIAGNOSIS — I10 Essential (primary) hypertension: Secondary | ICD-10-CM

## 2019-10-02 DIAGNOSIS — E785 Hyperlipidemia, unspecified: Secondary | ICD-10-CM

## 2019-10-02 DIAGNOSIS — R739 Hyperglycemia, unspecified: Secondary | ICD-10-CM

## 2019-10-02 DIAGNOSIS — F3342 Major depressive disorder, recurrent, in full remission: Secondary | ICD-10-CM

## 2019-10-02 NOTE — Progress Notes (Signed)
Chronic Care Management Pharmacy  Name: Ann Washington  MRN: [id number] DOB: 1950-12-02  Chief Complaint/ HPI  Ann Washington,  69 y.o., female presents for their Initial CCM visit with the clinical pharmacist via telephone due to COVID-19 Pandemic.  PCP : Marin Olp, MD  Chronic conditions include:  Encounter Diagnoses  Name Primary?  . Major depression, recurrent, full remission ([place])   . Hyperlipidemia, unspecified hyperlipidemia type   . Hypertension, essential   . Hyperglycemia     Office Visits:  03/29/2019 (PCP): routine exercise to support management of hyperglycemia, a1c down to 5.5 from 6.3 (08/2018)  Patient Active Problem List   Diagnosis Date Noted  . Osteoarthritis of ankle and foot 12/08/2018  . 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 Normocytic anemia 09/12/2014  . CKD (chronic kidney disease), stage III 03/14/2014  . Glaucoma 03/08/2014  . Insomnia 03/08/2014  . Stress incontinence, female 10/12/2011  . Vaginal atrophy 05/27/2011  . Hyperglycemia 12/09/2009  . Obesity 10/11/2008  . GERD- failed h2 blocker 08/25/2007  . Hyperlipidemia 10/13/2006  . Major depression, recurrent, full remission ([place]) 10/13/2006  . Hypertension, essential 10/10/2006   Past Surgical History:  Procedure Laterality Date  . CATARACT EXTRACTION     late 2019  . COLONOSCOPY    . COLPOSCOPY     age 25  . GYNECOLOGIC CRYOSURGERY     age 88  . HAMMER TOE SURGERY    . TUBAL LIGATION     Family History  Problem Relation Age of Onset  . Diabetes Sister   . Hypertension Sister   . Alzheimer's disease Mother   . Hip fracture Mother   . Osteoporosis Mother   . Deep vein thrombosis Father   . Heart disease Father        MI 49s, nonsmoker  . Hypertension Brother   . Diabetes Maternal Grandmother   . Stroke Maternal Grandfather   . Depression Paternal Grandmother   . Suicidality Paternal Grandfather   . Depression Paternal Grandfather   . Breast cancer Sister   .  Arthritis Sister   . Colon cancer Neg Hx    Allergies  Allergen Reactions  . Amlodipine Besy-Benazepril Hcl Other (See Comments)    Edema to ankle with redness/ but now she is tolerating the current regiment   Outpatient Encounter Medications as of 10/02/2019  Medication Sig  . amLODipine-valsartan (EXFORGE) 5-320 MG tablet TAKE 1 TABLET BY MOUTH EVERY DAY  . APPLE CIDER VINEGAR PO Take 500 mg by mouth daily.   Marland Kitchen escitalopram (LEXAPRO) 10 MG tablet Take 1 tablet (10 mg total) by mouth daily.  Marland Kitchen latanoprost (XALATAN) 0.005 % ophthalmic solution 1 drop at bedtime.  . Multiple Vitamin (MULTIVITAMIN) tablet Take 1 tablet by mouth daily.    Marland Kitchen omeprazole (PRILOSEC) 20 MG capsule Take 20 mg by mouth daily.  . Red Yeast Rice Extract (RED YEAST RICE PO) Take 2 tablets by mouth at bedtime.  . rosuvastatin (CRESTOR) 5 MG tablet Take 1 tablet (5 mg total) by mouth once a week.  . timolol (TIMOPTIC) 0.5 % ophthalmic solution Place 1 drop into both eyes 2 (two) times daily.  Marland Kitchen zolpidem (AMBIEN) 5 MG tablet TAKE 1 TABLET BY MOUTH EVERYDAY AT BEDTIME  . travoprost, benzalkonium, (TRAVATAN) 0.004 % ophthalmic solution Place 1 drop into both eyes at bedtime.     No facility-administered encounter medications on file as of 10/02/2019.   Patient Care Team    Relationship Specialty Notifications Start End  Marin Olp, MD PCP - General Family Medicine  03/14/14   Paula Compton, MD Consulting Physician Obstetrics and Gynecology  03/08/14   Rolm Bookbinder, MD Consulting Physician Dermatology  03/08/14   Charlett Blake, MD Consulting Physician Physical Medicine and Rehabilitation  10/05/18   Marygrace Drought, MD Consulting Physician Ophthalmology  10/05/18   Garrel Ridgel, [place] Consulting Physician Podiatry  10/05/18   [name], [facility] Pharmacist Pharmacist  08/27/19    Comment: [phone]   Current Diagnosis/Assessment: Goals Addressed            This Visit's Progress   . PharmD Care Plan        CARE PLAN ENTRY (see longitudinal plan of care for additional care plan information)  Current Barriers:  . Chronic Disease Management support, education, and care coordination needs related to Hypertension, Hyperlipidemia, hyperglycemia, and depression.   Hypertension BP Readings from Last 3 Encounters:  03/29/19 130/80  11/28/18 122/66  11/17/18 123/62   . Pharmacist Clinical Goal(s): o Over the next 180 days, patient will work with PharmD and providers to maintain BP goal <130/80 . Current regimen:  o Amlodipine-valsartan 5-320 mg once daily . Interventions: o Continue current management . Patient self care activities - Over the next 180 days, patient will: o Check BP at least once every 1-2 weeks, document, and provide at future appointments o Ensure daily salt intake < 2300 mg/day  Hyperlipidemia Lab Results  Component Value Date/Time   LDLCALC 179 (H) 09/21/2018 10:42 AM   LDLDIRECT 151.0 03/29/2019 10:24 AM   . Pharmacist Clinical Goal(s): o Over the next 180 days, patient will work with PharmD and providers to achieve LDL goal < 100; longitudinal LDL goal <70. . Current regimen:  o Crestor 5 mg once WEEKLY o Red yeast rice extract daily  . Interventions: o Diet/exercise recommendations . Patient self care activities - Over the next 180 days, patient will: o Continue current management  Hyperglycemia Lab Results  Component Value Date/Time   HGBA1C 6.1 03/29/2019 10:24 AM   HGBA1C 5.5 03/29/2019 10:16 AM   HGBA1C 6.3 09/21/2018 10:42 AM   . Pharmacist Clinical Goal(s): o Over the next 180 days, patient will work with PharmD and providers to maintain A1c goal <6.5% . Current regimen:  o No current medications . Interventions: o Diet/exercise recommendations . Patient self care activities - Over the next 180 days, patient will: o Replace main sources of carbohydrates with fruits/vegetables   Depression . Pharmacist Clinical Goal(s) o Over the next 180 days,  patient will work with PharmD and providers to minimize symptoms of depression . Current regimen:  o Escitalopram 10 mg once daily . Interventions: o Continue current management . Patient self care activities - Over the next 180 days, patient will: o Continue current management  Medication management . Pharmacist Clinical Goal(s): o Over the next 180 days, patient will work with PharmD and providers to maintain optimal medication adherence . Current pharmacy: [facility] . Interventions o Comprehensive medication review performed. o Continue current medication management strategy . Patient self care activities - Over the next 180 days, patient will: o Take medications as prescribed o Report any questions or concerns to PharmD and/or provider(s) Initial goal documentation.      Hypertension   BP goal <130/80  BP Readings from Last 3 Encounters:  03/29/19 130/80  11/28/18 122/66  11/17/18 123/62   Patient checks BP at home when feeling symptomatic Patient home BP readings are ranging: n/a.  Patient is currently controlled on the following medications:  . Amlodipine-valsartan 5-320 mg once daily   We discussed diet and exercise extensively. Works out on elliptical three times weekly at gym.  Plan  Continue current medications and control with diet and exercise.   hyperglycemia   A1c goal < 6.5%  Lab Results  Component Value Date/Time   HGBA1C 6.1 03/29/2019 10:24 AM   HGBA1C 5.5 03/29/2019 10:16 AM   HGBA1C 6.3 09/21/2018 10:42 AM    Checking BG: n/a. Recent FBG readings: n/a. Patient is currently controlled on the following medications:  . No medications at this time  We discussed: diet and exercise extensively. Recent example diet: dinner- macaroni or potato salad, crackers w/ peanut butter for breakfast, also snacks on crackers throughout day. Trying to replace crackers with fruits.   Plan  Continue current medications.  Hyperlipidemia   LDL goal  < 70  Lipid Panel     Component Value Date/Time   CHOL 261 (H) 09/21/2018 1042   TRIG 146.0 09/21/2018 1042   HDL 52.40 09/21/2018 1042   LDLCALC 179 (H) 09/21/2018 1042   LDLDIRECT 151.0 03/29/2019 1024    Hepatic Function Latest Ref Rng & Units 03/29/2019 09/21/2018 09/15/2017  Total Protein 6.0 - 8.3 g/dL 7.7 7.8 8.1  Albumin 3.5 - 5.2 g/dL 4.3 4.6 4.5  AST 0 - 37 U/L _0 ALT 0 - 35 U/L _1 Alk Phosphatase 39 - 117 U/L 54 54 60  Total Bilirubin 0.2 - 1.2 mg/dL 0.8 0.7 0.8  Bilirubin, Direct 0.0 - 0.3 mg/dL - - -    The 10-year ASCVD risk score Mikey Bussing DC Jr., et al., 2013) is: 21.1%   Values used to calculate the score:     Age: 7 years     Sex: Female     Is Non-Hispanic African American: No     Diabetic: No     Tobacco smoker: No     Systolic Blood Pressure: 287 mmHg     Is BP treated: Yes     HDL Cholesterol: 52.4 mg/dL     Total Cholesterol: 261 mg/dL   Patient is currently not at goal on the following medications:   Crestor 5 mg once daily   Red yeast rice extract daily  Plan  Continue current medications and control with diet and exercise.  GERD   Patient denies recent acid reflux. Previously failed h2 blocker.  Currently controlled on: . Omeprazole 20 mg once daily every morning  We discussed: Avoidance of potential triggers such as caffeine, peppermint and tight clothing.  Plan   Continue current medications.  Osteopenia / Osteoporosis   Last DEXA Scan: 05/2019. Normal.    Vit D, 25-Hydroxy  Date Value Ref Range Status  12/11/2010 35 30 - 89 ng/mL Final    Comment:    This assay accurately quantifies Vitamin D, which is the sum of the 25-Hydroxy forms of Vitamin D2 and D3.  Studies have shown that the optimum concentration of 25-Hydroxy Vitamin D is 30 ng/mL or higher.  Concentrations of Vitamin D between 20 and 29 ng/mL are considered to be insufficient and concentrations less than 20 ng/mL are considered to be deficient for Vitamin  D.   Patient is not a candidate for pharmacologic treatment. Patient is currently controlled on the following medications:  . Calcium+d supplementation   We discussed:  Recommend [phone] units of vitamin D daily. Recommend 1200 mg of calcium daily from dietary and  supplemental sources. Recommend weight-bearing and muscle strengthening exercises for building and maintaining bone density.  Plan  Continue current medications.  Depression   PHQ9 SCORE ONLY 10/02/2019 03/29/2019 10/05/2018  PHQ-9 Total Score 0 0 0   Patient is currently controlled on the following medications:  . escitalopram 10 mg once daily   Plan  Continue current medications.  Insomnia   Patient has failed these meds in past: alprazolam. Patient is currently controlled on the following medications:  Marland Kitchen Zolpidem 5 mg once daily  We discussed:  Side effects of medications at length.No current side effects Feels well rested most days, is sleeping throughout the night.   Plan  Continue current medications.  Vaccines   Immunization History  Administered Date(s) Administered  . Influenza,inj,Quad PF,6+ Mos 11/15/2012, 10/30/2015  . Influenza-Unspecified 09/25/2013, 11/10/2014, 10/29/2016  . PFIZER SARS-COV-2 Vaccination 02/15/2019, 03/08/2019  . Pneumococcal Conjugate-13 03/08/2014  . Pneumococcal Polysaccharide-23 03/13/2015  . Td 01/26/2003  . Tdap 10/16/2015  . Zoster 12/12/2009   Reviewed and discussed patient's vaccination history. Plans to have flu shot in office 09/2019 visit, covid vaccine booster 10/2019, then Shingrix series once flu/COVID complete.  Plan  Recommended patient receive vaccines as outlined above.   Medication Management / Care Coordination   Receives prescription medications from:  [id number] [place], [place] [id number]: [phone]: [phone] Denies any issues with current  medication management.   Plan  Continue current medication management strategy. ___________________________ SDOH (Social Determinants of Health) assessments performed: Yes.  Future Appointments  Date Time [place] 10/12/2019  8:40 AM HMarin Olp MD [facility]  04/01/2020  1:00 PM [facility] CCM PHARMACIST [facility]   Visit follow-up:  . [facility] follow-up: 6 month telephone visit.  JMadelin Rear Pharm.D., BCGP Clinical Pharmacist [facility] ([phone]

## 2019-10-02 NOTE — Patient Instructions (Signed)
Hi Ann Washington - Please review care plan below and call me at [phone] with any questions!  Thank you, Edyth Gunnels., Clinical Pharmacist  Goals Addressed            This Visit's Progress   . PharmD Care Plan       CARE PLAN ENTRY (see longitudinal plan of care for additional care plan information)  Current Barriers:  . Chronic Disease Management support, education, and care coordination needs related to Hypertension, Hyperlipidemia, hyperglycemia, and depression.   Hypertension BP Readings from Last 3 Encounters:  03/29/19 130/80  11/28/18 122/66  11/17/18 123/62   . Pharmacist Clinical Goal(s): o Over the next 180 days, patient will work with PharmD and providers to maintain BP goal <130/80 . Current regimen:  o Amlodipine-valsartan 5-320 mg once daily . Interventions: o Continue current management . Patient self care activities - Over the next 180 days, patient will: o Check BP at least once every 1-2 weeks, document, and provide at future appointments o Ensure daily salt intake < 2300 mg/day  Hyperlipidemia Lab Results  Component Value Date/Time   LDLCALC 179 (H) 09/21/2018 10:42 AM   LDLDIRECT 151.0 03/29/2019 10:24 AM   . Pharmacist Clinical Goal(s): o Over the next 180 days, patient will work with PharmD and providers to achieve LDL goal < 100; longitudinal LDL goal <70. . Current regimen:  o Crestor 5 mg once WEEKLY o Red yeast rice extract daily  . Interventions: o Diet/exercise recommendations . Patient self care activities - Over the next 180 days, patient will: o Continue current management  Hyperglycemia Lab Results  Component Value Date/Time   HGBA1C 6.1 03/29/2019 10:24 AM   HGBA1C 5.5 03/29/2019 10:16 AM   HGBA1C 6.3 09/21/2018 10:42 AM   . Pharmacist Clinical Goal(s): o Over the next 180 days, patient will work with PharmD and providers to maintain A1c goal <6.5% . Current regimen:  o No current medications . Interventions: o Diet/exercise  recommendations . Patient self care activities - Over the next 180 days, patient will: o Replace main sources of carbohydrates with fruits/vegetables   Depression . Pharmacist Clinical Goal(s) o Over the next 180 days, patient will work with PharmD and providers to minimize symptoms of depression . Current regimen:  o Escitalopram 10 mg once daily . Interventions: o Continue current management . Patient self care activities - Over the next 180 days, patient will: o Continue current management  Medication management . Pharmacist Clinical Goal(s): o Over the next 180 days, patient will work with PharmD and providers to maintain optimal medication adherence . Current pharmacy: [facility] . Interventions o Comprehensive medication review performed. o Continue current medication management strategy . Patient self care activities - Over the next 180 days, patient will: o Take medications as prescribed o Report any questions or concerns to PharmD and/or provider(s) Initial goal documentation.      Ms. Moder was given information about Chronic Care Management services today including:  1. CCM service includes personalized support from designated clinical staff supervised by her physician, including individualized plan of care and coordination with other care providers 2. 24/7 contact phone numbers for assistance for urgent and routine care needs. 3. Standard insurance, coinsurance, copays and deductibles apply for chronic care management only during months in which we provide at least 20 minutes of these services. Most insurances cover these services at 100%, however patients may be responsible for any copay, coinsurance and/or deductible if applicable. This service may help you avoid  the need for more expensive face-to-face services. 4. Only one practitioner may furnish and bill the service in a calendar month. 5. The patient may stop CCM services at any time (effective at the end of  the month) by phone call to the office staff.  Patient agreed to services and verbal consent obtained.   The patient verbalized understanding of instructions provided today and agreed to receive a mailed copy of patient instruction and/or educational materials. Telephone follow up appointment with pharmacy team member scheduled for: See next appointment with "Care Management Staff" under "What's Next" below.   [name], Pharm.D., BCGP Clinical Pharmacist [facility] [phone]  High Cholesterol  High cholesterol is a condition in which the blood has high levels of a white, waxy, fat-like substance (cholesterol). The human body needs small amounts of cholesterol. The liver makes all the cholesterol that the body needs. Extra (excess) cholesterol comes from the food that we eat. Cholesterol is carried from the liver by the blood through the blood vessels. If you have high cholesterol, deposits (plaques) may build up on the walls of your blood vessels (arteries). Plaques make the arteries narrower and stiffer. Cholesterol plaques increase your risk for heart attack and stroke. Work with your health care provider to keep your cholesterol levels in a healthy range. What increases the risk? This condition is more likely to develop in people who:  Eat foods that are high in animal fat (saturated fat) or cholesterol.  Are overweight.  Are not getting enough exercise.  Have a family history of high cholesterol. What are the signs or symptoms? There are no symptoms of this condition. How is this diagnosed? This condition may be diagnosed from the results of a blood test.  If you are older than age 32, your health care provider may check your cholesterol every 4-6 years.  You may be checked more often if you already have high cholesterol or other risk factors for heart disease. The blood test for cholesterol measures:  "Bad" cholesterol (LDL cholesterol). This is the main  type of cholesterol that causes heart disease. The desired level for LDL is less than 100.  "Good" cholesterol (HDL cholesterol). This type helps to protect against heart disease by cleaning the arteries and carrying the LDL away. The desired level for HDL is 60 or higher.  Triglycerides. These are fats that the body can store or burn for energy. The desired number for triglycerides is lower than 150.  Total cholesterol. This is a measure of the total amount of cholesterol in your blood, including LDL cholesterol, HDL cholesterol, and triglycerides. A healthy number is less than 200. How is this treated? This condition is treated with diet changes, lifestyle changes, and medicines. Diet changes  This may include eating more whole grains, fruits, vegetables, nuts, and fish.  This may also include cutting back on red meat and foods that have a lot of added sugar. Lifestyle changes  Changes may include getting at least 40 minutes of aerobic exercise 3 times a week. Aerobic exercises include walking, biking, and swimming. Aerobic exercise along with a healthy diet can help you maintain a healthy weight.  Changes may also include quitting smoking. Medicines  Medicines are usually given if diet and lifestyle changes have failed to reduce your cholesterol to healthy levels.  Your health care provider may prescribe a statin medicine. Statin medicines have been shown to reduce cholesterol, which can reduce the risk of heart disease. Follow these instructions at home:  Eating and drinking If told by your health care provider:  Eat chicken (without skin), fish, veal, shellfish, ground [place] breast, and round or loin cuts of red meat.  Do not eat fried foods or fatty meats, such as hot dogs and salami.  Eat plenty of fruits, such as apples.  Eat plenty of vegetables, such as broccoli, potatoes, and carrots.  Eat beans, peas, and lentils.  Eat grains such as barley, rice, couscous, and  bulgur wheat.  Eat pasta without cream sauces.  Use skim or nonfat milk, and eat low-fat or nonfat yogurt and cheeses.  Do not eat or drink whole milk, cream, ice cream, egg yolks, or hard cheeses.  Do not eat stick margarine or tub margarines that contain trans fats (also called partially hydrogenated oils).  Do not eat saturated tropical oils, such as coconut oil and palm oil.  Do not eat cakes, cookies, crackers, or other baked goods that contain trans fats.  General instructions  Exercise as directed by your health care provider. Increase your activity level with activities such as gardening, walking, and taking the stairs.  Take over-the-counter and prescription medicines only as told by your health care provider.  Do not use any products that contain nicotine or tobacco, such as cigarettes and e-cigarettes. If you need help quitting, ask your health care provider.  Keep all follow-up visits as told by your health care provider. This is important. Contact a health care provider if:  You are struggling to maintain a healthy diet or weight.  You need help to start on an exercise program.  You need help to stop smoking. Get help right away if:  You have chest pain.  You have trouble breathing. This information is not intended to replace advice given to you by your health care provider. Make sure you discuss any questions you have with your health care provider. Document Revised: 01/14/2017 Document Reviewed: 07/12/2015 Elsevier Patient Education  [place].

## 2019-10-02 NOTE — Progress Notes (Addendum)
  Chronic Care Management   Outreach Note   Name: Ann Washington MRN: [id number] DOB: 1950/04/07  Referred by: Marin Olp, MD Reason for referral: Telephone Appointment with Clinical Pharmacist, [name].   Telephone appointment with clinical pharmacist today (10/02/2019) at 12:30pm. If patient immediately returns call, transfer to [phone]. Otherwise, please provide number to patient to reschedule visit.  [name], Pharm.D., BCGP Clinical Pharmacist [place] [phone]

## 2019-10-11 NOTE — Patient Instructions (Addendum)
Health Maintenance Due  Topic Date Due  . INFLUENZA VACCINE- prefers regular dose flu shot today 08/26/2019   Please schedule a wellness visit with our nurse Otila Kluver- these can be virtual if that's more convenient for you. Last done 10/05/2018  Lets do flonase in the morning and xyzal or claritin before bed.   Follow up with Dr. Doran Durand  Please stop by lab before you go If you have mychart- we will send your results within 3 business days of [place] receiving them.  If you do not have mychart- we will call you about results within 5 business days of [place] receiving them.  *please note we are currently using [facility] which has a longer processing time than [place] typically so labs may not come back as quickly as in the past *please also note that you will see labs on mychart as soon as they post. I will later go in and write notes on them- will say "notes from Dr. Yong Channel"

## 2019-10-11 NOTE — Progress Notes (Signed)
Phone [phone] In person visit   Subjective:   Ann Washington is a 69 y.o. year old very pleasant female patient who presents for/with See problem oriented charting Chief Complaint  Patient presents with  . Hyperlipidemia  . Hypertension   This visit occurred during the SARS-CoV-2 public health emergency.  Safety protocols were in place, including screening questions prior to the visit, additional usage of staff PPE, and extensive cleaning of exam room while observing appropriate contact time as indicated for disinfecting solutions.   Past Medical History-  Patient Active Problem List   Diagnosis Date Noted  . CKD (chronic kidney disease), stage III 03/14/2014    Priority: Medium  . Glaucoma 03/08/2014    Priority: Medium  . Insomnia 03/08/2014    Priority: Medium  . Hyperglycemia 12/09/2009    Priority: Medium  . Hyperlipidemia 10/13/2006    Priority: Medium  . Major depression, recurrent, full remission ([place]) 10/13/2006    Priority: Medium  . Hypertension, essential 10/10/2006    Priority: Medium  . Normocytic anemia 09/12/2014    Priority: Low  . Stress incontinence, female 10/12/2011    Priority: Low  . Vaginal atrophy 05/27/2011    Priority: Low  . Obesity 10/11/2008    Priority: Low  . GERD- failed h2 blocker 08/25/2007    Priority: Low  . Osteoarthritis of ankle and foot 12/08/2018  . 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 amLODipine-valsartan (EXFORGE) 5-320 MG tablet TAKE 1 TABLET BY MOUTH EVERY DAY 90 tablet 3  . APPLE CIDER VINEGAR PO Take 500 mg by mouth daily.     Marland Kitchen escitalopram (LEXAPRO) 5 MG tablet Take 1 tablet (5 mg total) by mouth daily. 90 tablet 3  . latanoprost (XALATAN) 0.005 % ophthalmic solution 1 drop at bedtime.    . Multiple Vitamin (MULTIVITAMIN) tablet Take 1 tablet by mouth daily.      Marland Kitchen omeprazole (PRILOSEC) 20 MG capsule Take 20 mg by  mouth daily.    . Red Yeast Rice Extract (RED YEAST RICE PO) Take 2 tablets by mouth at bedtime.    . rosuvastatin (CRESTOR) 5 MG tablet Take 1 tablet (5 mg total) by mouth once a week. 13 tablet 3  . timolol (TIMOPTIC) 0.5 % ophthalmic solution Place 1 drop into both eyes 2 (two) times daily.  0  . zolpidem (AMBIEN) 5 MG tablet TAKE 1 TABLET BY MOUTH EVERYDAY AT BEDTIME 30 tablet 5  . fluticasone (FLONASE) 50 MCG/ACT nasal spray Place 2 sprays into both nostrils daily. 16 g 3   No current facility-administered medications for this visit.     Objective:  BP 128/84   Pulse (!) 57   Temp 98.1 F (36.7 C) (Temporal)   Resp 18   Ht 5' (1.524 m)   Wt 180 lb (81.6 kg)   SpO2 96%   BMI 35.15 kg/m  Gen: NAD, resting comfortably CV: RRR no murmurs rubs or gallops Lungs: CTAB no crackles, wheeze, rhonchi Abdomen: soft/nontender/nondistended/normal bowel sounds.  Ext: no edema Skin: warm, dry     Assessment and Plan   #allergic rhinitis S: She has a lot of mucus in the mornings for most of the summer. Getting some post nasal drip. Mild soreness in right neck. Small lymph node enlarged in the neck  flonase helps some and wants refills A/P: This sounds like allergic rhinitis.  She is having several months of nasal congestion primarily  in the morning ago from postnasal drip-does have an irritated lymph node today about 1 cm in size.  She is going to monitor the lymph node and if not better in 3 to 4 weeks let us know-I suspect this will go back down.  I will refill fluticasone for her since this has been helpful in the past.  Also would be reasonable to try something like Xyzal or Claritin before bed  #Left foot pain S: Patient mentioned that her left foot has been bothering her since her surgery 2 years ago with Dr. Milinda Pointer. She thinks it may arthritis. Feels slightly unstable. Has seen Dr. Doran Durand after surgery A/P: with instability sensation recommended follow up with Dr. Doran Durand with [facility]  #Hypertension/CKD stage III S: Compliant with amlodipine-valsartan 5-320 mg.  Knows to avoid NSAIDs-Celebrex prescribed by Dr. Milinda Pointer in past is no longer on  Valsartan is protective in case there is a proteinuric element. BP Readings from Last 3 Encounters:  10/12/19 128/84  03/29/19 130/80  11/28/18 122/66  A/P: Hypertension well-controlled today-continue current medications  For chronic kidney disease stage III-continue to avoid NSAIDs.  Update CMP with blood work today  #Hyperlipidemia S: Poorly controlled on red yeast rice alone.  Myalgias on pravastatin in the past. Doing ok on rosuvastatin 5 mg once a week Lab Results  Component Value Date   CHOL 261 (H) 09/21/2018   HDL 52.40 09/21/2018   LDLCALC 179 (H) 09/21/2018   LDLDIRECT 151.0 03/29/2019   TRIG 146.0 09/21/2018   CHOLHDL 5 09/21/2018  A/P: Hopefully improved-LDL did come down to 151 from 179 last visit-update lipid panel today.  If LDL remains above 100 which I suspect it will-we will try to increase the rosuvastatin to 5 mg twice a week and continue red yeast rice   #Hyperglycemia S: Increased risk of diabetes.  No current medication.  Lifestyle modification only. Exercise and diet-  3x a week on elliptical and bike and not worsening ankle pain. Down 2 lbs from last visit- trying to make healthier choices Lab Results  Component Value Date   HGBA1C 6.1 03/29/2019   HGBA1C 5.5 03/29/2019   HGBA1C 6.3 09/21/2018    A/P: hopefully stable or improved- update a1c today   #GERD S: Has been well controlled on Prilosec 20mg . zantac was not helpful for her. b12 was excellent years ago A/P: reasonable control-continue current medication  #Depression in full remission  S: Compliant with Lexapro 10 mg.  PHQ 9 of0  Depression screen [facility] 2/9 10/02/2019 03/29/2019 10/05/2018  Decreased Interest 0 0 0  Down, Depressed, Hopeless 0 0 0  PHQ - 2 Score 0 0 0  Altered sleeping 0 0 -  Tired, decreased energy 0 0 -  Change in  appetite 0 0 -  Feeling bad or failure about yourself  0 0 -  Trouble concentrating 0 0 -  Moving slowly or fidgety/restless 0 0 -  Suicidal thoughts 0 0 -  PHQ-9 Score 0 0 -  Difficult doing work/chores - Not difficult at all -   A/P: Remains in full remission.  She would like to try a lower dose and see how she does.  We will reduce to 5 mg and follow-up in 6 months-if doing well at that time we may stop the medicine  Recommended follow up: Return in about 6 months (around 04/10/2020) for follow up- or sooner if needed. Future Appointments  Date Time Provider [place]  04/01/2020  1:00 PM [facility]  PHARMACIST [facility]   Lab/Order associations:   ICD-10-CM   1. Hypertension, essential  I10   2. Gastroesophageal reflux disease without esophagitis  K21.9   3. Hyperglycemia  R73.9 Hemoglobin A1c  4. Hyperlipidemia, unspecified hyperlipidemia type  E78.5 CBC With Differential/Platelet    COMPLETE METABOLIC PANEL WITH GFR    Lipid Panel (Refl)  5. Major depression, recurrent, full remission ([place])  F33.42   6. Allergic rhinitis, unspecified seasonality, unspecified trigger  J30.9     Meds ordered this encounter  Medications  . fluticasone (FLONASE) 50 MCG/ACT nasal spray    Sig: Place 2 sprays into both nostrils daily.    Dispense:  16 g    Refill:  3  . escitalopram (LEXAPRO) 5 MG tablet    Sig: Take 1 tablet (5 mg total) by mouth daily.    Dispense:  90 tablet    Refill:  3   Immunization History  Administered Date(s) Administered  . Influenza,inj,Quad PF,6+ Mos 11/15/2012, 10/30/2015  . Influenza-Unspecified 09/25/2013, 11/10/2014, 10/29/2016  . PFIZER SARS-COV-2 Vaccination 02/15/2019, 03/08/2019  . Pneumococcal Conjugate-13 03/08/2014  . Pneumococcal Polysaccharide-23 03/13/2015  . Td 01/26/2003  . Tdap 10/16/2015  . Zoster 12/12/2009   - pr  Return precautions advised.  Garret Reddish, MD

## 2019-10-12 ENCOUNTER — Other Ambulatory Visit: Payer: Self-pay

## 2019-10-12 ENCOUNTER — Encounter: Payer: Self-pay | Admitting: Family Medicine

## 2019-10-12 ENCOUNTER — Ambulatory Visit (INDEPENDENT_AMBULATORY_CARE_PROVIDER_SITE_OTHER): Payer: Medicare Other | Admitting: Family Medicine

## 2019-10-12 VITALS — BP 128/84 | HR 57 | Temp 98.1°F | Resp 18 | Ht 60.0 in | Wt 180.0 lb

## 2019-10-12 DIAGNOSIS — F3342 Major depressive disorder, recurrent, in full remission: Secondary | ICD-10-CM | POA: Diagnosis not present

## 2019-10-12 DIAGNOSIS — I10 Essential (primary) hypertension: Secondary | ICD-10-CM

## 2019-10-12 DIAGNOSIS — E785 Hyperlipidemia, unspecified: Secondary | ICD-10-CM

## 2019-10-12 DIAGNOSIS — J309 Allergic rhinitis, unspecified: Secondary | ICD-10-CM

## 2019-10-12 DIAGNOSIS — R739 Hyperglycemia, unspecified: Secondary | ICD-10-CM

## 2019-10-12 DIAGNOSIS — K219 Gastro-esophageal reflux disease without esophagitis: Secondary | ICD-10-CM

## 2019-10-12 DIAGNOSIS — Z23 Encounter for immunization: Secondary | ICD-10-CM

## 2019-10-12 MED ORDER — FLUTICASONE PROPIONATE 50 MCG/ACT NA SUSP: 2.0000 | Freq: Every day | NASAL | 3 refills | Status: DC

## 2019-10-12 MED ORDER — ESCITALOPRAM OXALATE 5 MG PO TABS: 5.0000 mg | ORAL_TABLET | Freq: Every day | ORAL | 3 refills | Status: DC

## 2019-10-12 NOTE — Addendum Note (Signed)
Addended by: Milton Ferguson D on: 10/12/2019 09:51 AM   Modules accepted: Orders

## 2019-10-12 NOTE — Addendum Note (Signed)
Addended by: Thomes Cake on: 10/12/2019 10:05 AM   Modules accepted: Orders

## 2019-10-13 LAB — CBC WITH DIFFERENTIAL/PLATELET
Absolute Monocytes: 762 cells/uL (ref 200–950)
Basophils Absolute: 52 cells/uL (ref 0–200)
Basophils Relative: 0.7 %
Eosinophils Absolute: 281 cells/uL (ref 15–500)
Eosinophils Relative: 3.8 %
HCT: 38.7 % (ref 35.0–45.0)
Hemoglobin: 12.8 g/dL (ref 11.7–15.5)
Lymphs Abs: 1968 cells/uL (ref 850–3900)
MCH: 30.8 pg (ref 27.0–33.0)
MCHC: 33.1 g/dL (ref 32.0–36.0)
MCV: 93.3 fL (ref 80.0–100.0)
MPV: 10.4 fL (ref 7.5–12.5)
Monocytes Relative: 10.3 %
Neutro Abs: 4336 cells/uL (ref 1500–7800)
Neutrophils Relative %: 58.6 %
Platelets: 228 10*3/uL (ref 140–400)
RBC: 4.15 10*6/uL (ref 3.80–5.10)
RDW: 12.1 % (ref 11.0–15.0)
Total Lymphocyte: 26.6 %
WBC: 7.4 10*3/uL (ref 3.8–10.8)

## 2019-10-13 LAB — COMPLETE METABOLIC PANEL WITH GFR
AG Ratio: 1.5 (calc) (ref 1.0–2.5)
ALT: 12 U/L (ref 6–29)
AST: 17 U/L (ref 10–35)
Albumin: 4.5 g/dL (ref 3.6–5.1)
Alkaline phosphatase (APISO): 56 U/L (ref 37–153)
BUN: 21 mg/dL (ref 7–25)
CO2: 28 mmol/L (ref 20–32)
Calcium: 9.7 mg/dL (ref 8.6–10.4)
Chloride: 103 mmol/L (ref 98–110)
Creat: 0.78 mg/dL (ref 0.60–0.93)
GFR, Est African American: 89 mL/min/{1.73_m2} (ref >=60)
GFR, Est Non African American: 77 mL/min/{1.73_m2} (ref >=60)
Globulin: 3.1 g/dL (calc) (ref 1.9–3.7)
Glucose, Bld: 106 mg/dL — ABNORMAL HIGH (ref 65–99)
Potassium: 4.2 mmol/L (ref 3.5–5.3)
Sodium: 138 mmol/L (ref 135–146)
Total Bilirubin: 0.8 mg/dL (ref 0.2–1.2)
Total Protein: 7.6 g/dL (ref 6.1–8.1)

## 2019-10-13 LAB — LIPID PANEL (REFL)
Cholesterol: 212 mg/dL — ABNORMAL HIGH (ref <200)
HDL: 55 mg/dL (ref >=50)
LDL Cholesterol (Calc): 135 mg/dL (calc) — ABNORMAL HIGH
Non-HDL Cholesterol (Calc): 157 mg/dL (calc) — ABNORMAL HIGH (ref <130)
Total CHOL/HDL Ratio: 3.9 (calc) (ref <5.0)
Triglycerides: 114 mg/dL (ref <150)

## 2019-10-13 LAB — HEMOGLOBIN A1C
Hgb A1c MFr Bld: 6 % of total Hgb — ABNORMAL HIGH (ref <5.7)
Mean Plasma Glucose: 126 (calc)
eAG (mmol/L): 7 (calc)

## 2019-10-16 ENCOUNTER — Other Ambulatory Visit: Payer: Self-pay | Admitting: Family Medicine

## 2019-10-16 NOTE — Telephone Encounter (Signed)
Pt requesting refill. Last OV 10/12/2019.

## 2019-10-17 ENCOUNTER — Other Ambulatory Visit: Payer: Self-pay

## 2019-10-17 MED ORDER — ROSUVASTATIN CALCIUM 5 MG PO TABS: 5.0000 mg | ORAL_TABLET | ORAL | 3 refills | Status: DC

## 2019-10-22 ENCOUNTER — Ambulatory Visit (INDEPENDENT_AMBULATORY_CARE_PROVIDER_SITE_OTHER): Payer: Medicare Other

## 2019-10-22 DIAGNOSIS — Z0000 Encounter for general adult medical examination without abnormal findings: Secondary | ICD-10-CM

## 2019-10-22 NOTE — Patient Instructions (Addendum)
Ann Washington , Thank you for taking time to come for your Medicare Wellness Visit. I appreciate your ongoing commitment to your health goals. Please review the following plan we discussed and let me know if I can assist you in the future.   Screening recommendations/referrals: Colonoscopy: Done 08/09/12 Mammogram: Done 05/31/19 Bone Density: Done 05/31/19 Recommended yearly ophthalmology/optometry visit for glaucoma screening and checkup Recommended yearly dental visit for hygiene and checkup  Vaccinations: Influenza vaccine: Up to date Pneumococcal vaccine: Up to date Tdap vaccine: Up to date Shingles vaccine: Shingrix discussed. Please contact your pharmacy for coverage information.    Covid-19:completed 1/21 & 03/08/19  Advanced directives: Please bring a copy of your health care power of attorney and living will to the office at your convenience.  Conditions/risks identified: Get cholesterol and glucose down   Next appointment: Follow up in one year for your annual wellness visit    Preventive Care 65 Years and Older, Female Preventive care refers to lifestyle choices and visits with your health care provider that can promote health and wellness. What does preventive care include?  A yearly physical exam. This is also called an annual well check.  Dental exams once or twice a year.  Routine eye exams. Ask your health care provider how often you should have your eyes checked.  Personal lifestyle choices, including:  Daily care of your teeth and gums.  Regular physical activity.  Eating a healthy diet.  Avoiding tobacco and drug use.  Limiting alcohol use.  Practicing safe sex.  Taking low-dose aspirin every day.  Taking vitamin and mineral supplements as recommended by your health care provider. What happens during an annual well check? The services and screenings done by your health care provider during your annual well check will depend on your age, overall health,  lifestyle risk factors, and family history of disease. Counseling  Your health care provider may ask you questions about your:  Alcohol use.  Tobacco use.  Drug use.  Emotional well-being.  Home and relationship well-being.  Sexual activity.  Eating habits.  History of falls.  Memory and ability to understand (cognition).  Work and work Statistician.  Reproductive health. Screening  You may have the following tests or measurements:  Height, weight, and BMI.  Blood pressure.  Lipid and cholesterol levels. These may be checked every 5 years, or more frequently if you are over 20 years old.  Skin check.  Lung cancer screening. You may have this screening every year starting at age 71 if you have a 30-pack-year history of smoking and currently smoke or have quit within the past 15 years.  Fecal occult blood test (FOBT) of the stool. You may have this test every year starting at age 66.  Flexible sigmoidoscopy or colonoscopy. You may have a sigmoidoscopy every 5 years or a colonoscopy every 10 years starting at age 48.  Hepatitis C blood test.  Hepatitis B blood test.  Sexually transmitted disease (STD) testing.  Diabetes screening. This is done by checking your blood sugar (glucose) after you have not eaten for a while (fasting). You may have this done every 1-3 years.  Bone density scan. This is done to screen for osteoporosis. You may have this done starting at age 88.  Mammogram. This may be done every 1-2 years. Talk to your health care provider about how often you should have regular mammograms. Talk with your health care provider about your test results, treatment options, and if necessary, the need for more  tests. Vaccines  Your health care provider may recommend certain vaccines, such as:  Influenza vaccine. This is recommended every year.  Tetanus, diphtheria, and acellular pertussis (Tdap, Td) vaccine. You may need a Td booster every 10 years.  Zoster  vaccine. You may need this after age 71.  Pneumococcal 13-valent conjugate (PCV13) vaccine. One dose is recommended after age 71.  Pneumococcal polysaccharide (PPSV23) vaccine. One dose is recommended after age 71. Talk to your health care provider about which screenings and vaccines you need and how often you need them. This information is not intended to replace advice given to you by your health care provider. Make sure you discuss any questions you have with your health care provider. Document Released: 02/07/2015 Document Revised: 10/01/2015 Document Reviewed: 11/12/2014 Elsevier Interactive Patient Education  2017 [place] Prevention in the Home Falls can cause injuries. They can happen to people of all ages. There are many things you can do to make your home safe and to help prevent falls. What can I do on the outside of my home?  Regularly fix the edges of walkways and driveways and fix any cracks.  Remove anything that might make you trip as you walk through a door, such as a raised step or threshold.  Trim any bushes or trees on the path to your home.  Use bright outdoor lighting.  Clear any walking paths of anything that might make someone trip, such as rocks or tools.  Regularly check to see if handrails are loose or broken. Make sure that both sides of any steps have handrails.  Any raised decks and porches should have guardrails on the edges.  Have any leaves, snow, or ice cleared regularly.  Use sand or salt on walking paths during winter.  Clean up any spills in your garage right away. This includes oil or grease spills. What can I do in the bathroom?  Use night lights.  Install grab bars by the toilet and in the tub and shower. Do not use towel bars as grab bars.  Use non-skid mats or decals in the tub or shower.  If you need to sit down in the shower, use a plastic, non-slip stool.  Keep the floor dry. Clean up any water that spills on the  floor as soon as it happens.  Remove soap buildup in the tub or shower regularly.  Attach bath mats securely with double-sided non-slip rug tape.  Do not have throw rugs and other things on the floor that can make you trip. What can I do in the bedroom?  Use night lights.  Make sure that you have a light by your bed that is easy to reach.  Do not use any sheets or blankets that are too big for your bed. They should not hang down onto the floor.  Have a firm chair that has side arms. You can use this for support while you get dressed.  Do not have throw rugs and other things on the floor that can make you trip. What can I do in the kitchen?  Clean up any spills right away.  Avoid walking on wet floors.  Keep items that you use a lot in easy-to-reach places.  If you need to reach something above you, use a strong step stool that has a grab bar.  Keep electrical cords out of the way.  Do not use floor polish or wax that makes floors slippery. If you must use wax, use non-skid floor  wax.  Do not have throw rugs and other things on the floor that can make you trip. What can I do with my stairs?  Do not leave any items on the stairs.  Make sure that there are handrails on both sides of the stairs and use them. Fix handrails that are broken or loose. Make sure that handrails are as long as the stairways.  Check any carpeting to make sure that it is firmly attached to the stairs. Fix any carpet that is loose or worn.  Avoid having throw rugs at the top or bottom of the stairs. If you do have throw rugs, attach them to the floor with carpet tape.  Make sure that you have a light switch at the top of the stairs and the bottom of the stairs. If you do not have them, ask someone to add them for you. What else can I do to help prevent falls?  Wear shoes that:  Do not have high heels.  Have rubber bottoms.  Are comfortable and fit you well.  Are closed at the toe. Do not wear  sandals.  If you use a stepladder:  Make sure that it is fully opened. Do not climb a closed stepladder.  Make sure that both sides of the stepladder are locked into place.  Ask someone to hold it for you, if possible.  Clearly mark and make sure that you can see:  Any grab bars or handrails.  First and last steps.  Where the edge of each step is.  Use tools that help you move around (mobility aids) if they are needed. These include:  Canes.  Walkers.  Scooters.  Crutches.  Turn on the lights when you go into a dark area. Replace any light bulbs as soon as they burn out.  Set up your furniture so you have a clear path. Avoid moving your furniture around.  If any of your floors are uneven, fix them.  If there are any pets around you, be aware of where they are.  Review your medicines with your doctor. Some medicines can make you feel dizzy. This can increase your chance of falling. Ask your doctor what other things that you can do to help prevent falls. This information is not intended to replace advice given to you by your health care provider. Make sure you discuss any questions you have with your health care provider. Document Released: 11/07/2008 Document Revised: 06/19/2015 Document Reviewed: 02/15/2014 Elsevier Interactive Patient Education  2017 [employer].

## 2019-10-22 NOTE — Progress Notes (Addendum)
Virtual Visit via Telephone Note  I connected with  Tayten Heber Fontenot on 10/22/19 at  3:15 PM EDT by telephone and verified that I am speaking with the correct person using two identifiers.  Medicare Annual Wellness visit completed telephonically due to Covid-19 pandemic.   Persons participating in this call: This Health Coach and this patient.   Location: Patient: Home Provider: Office   I discussed the limitations, risks, security and privacy concerns of performing an evaluation and management service by telephone and the availability of in person appointments. The patient expressed understanding and agreed to proceed.  Unable to perform video visit due to video visit attempted and failed and/or patient does not have video capability.   Some vital signs may be absent or patient reported.   Willette Brace, LPN    Subjective:   MALORI MYERS is a 69 y.o. female who presents for Medicare Annual (Subsequent) preventive examination.  Review of Systems     Cardiac Risk Factors include: advanced age (>45men, >49 women);hypertension;dyslipidemia;obesity (BMI >30kg/m2)     Objective:    There were no vitals filed for this visit. There is no height or weight on file to calculate BMI.  Advanced Directives 10/22/2019 10/05/2018 09/14/2017 04/01/2016  Does Patient Have a Medical Advance Directive? Yes No No No  Type of Paramedic of [place];Living will - - -  Copy of [place] in Chart? No - copy requested - - -  Would patient like information on creating a medical advance directive? - Yes (MAU/Ambulatory/Procedural Areas - Information given) No - Patient declined -    Current Medications (verified) Outpatient Encounter Medications as of 10/22/2019  Medication Sig  . amLODipine-valsartan (EXFORGE) 5-320 MG tablet TAKE 1 TABLET BY MOUTH EVERY DAY  . APPLE CIDER VINEGAR PO Take 500 mg by mouth daily.   Marland Kitchen escitalopram (LEXAPRO) 5 MG tablet  Take 1 tablet (5 mg total) by mouth daily.  . fluticasone (FLONASE) 50 MCG/ACT nasal spray Place 2 sprays into both nostrils daily.  Marland Kitchen latanoprost (XALATAN) 0.005 % ophthalmic solution 1 drop at bedtime.  . Multiple Vitamin (MULTIVITAMIN) tablet Take 1 tablet by mouth daily.    Marland Kitchen omeprazole (PRILOSEC) 20 MG capsule Take 20 mg by mouth daily.  . Red Yeast Rice Extract (RED YEAST RICE PO) Take 2 tablets by mouth at bedtime.  . rosuvastatin (CRESTOR) 5 MG tablet Take 1 tablet (5 mg total) by mouth 2 (two) times a week.  . timolol (TIMOPTIC) 0.5 % ophthalmic solution Place 1 drop into both eyes 2 (two) times daily.  . [DISCONTINUED] rosuvastatin (CRESTOR) 5 MG tablet Take 1 tablet (5 mg total) by mouth once a week.  . zolpidem (AMBIEN) 5 MG tablet TAKE 1 TABLET BY MOUTH EVERYDAY AT BEDTIME   No facility-administered encounter medications on file as of 10/22/2019.    Allergies (verified) Amlodipine besy-benazepril hcl   History: Past Medical History:  Diagnosis Date  . CIN I (cervical intraepithelial neoplasia I)    around age 42  . Depression   . 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 GERD (gastroesophageal reflux disease)   . Glaucoma   . Hyperlipidemia   . Hypertension   . Snoring 03/08/2008   Tested 2007 per patient-unrevealing for sleep apnea     Past Surgical History:  Procedure Laterality Date  . CATARACT EXTRACTION     late 2019  . COLONOSCOPY    .  COLPOSCOPY     age 89  . GYNECOLOGIC CRYOSURGERY     age 25  . HAMMER TOE SURGERY    . TUBAL LIGATION     Family History  Problem Relation Age of Onset  . Diabetes Sister   . Hypertension Sister   . Alzheimer's disease Mother   . Hip fracture Mother   . Osteoporosis Mother   . Deep vein thrombosis Father   . Heart disease Father        MI 76s, nonsmoker  . Hypertension Brother   . Diabetes Maternal Grandmother   . Stroke Maternal Grandfather   . Depression Paternal  Grandmother   . Suicidality Paternal Grandfather   . Depression Paternal Grandfather   . Breast cancer Sister   . Arthritis Sister   . Colon cancer Neg Hx    Social History   Socioeconomic History  . Marital status: Married    Spouse name: Not on file  . Number of children: Not on file  . Years of education: Not on file  . Highest education level: Not on file  Occupational History  . Not on file  Tobacco Use  . Smoking status: Never Smoker  . Smokeless tobacco: Never Used  Vaping Use  . Vaping Use: Never used  Substance and Sexual Activity  . Alcohol use: No  . Drug use: No  . Sexual activity: Yes    Birth control/protection: Surgical  Other Topics Concern  . Not on file  Social History Narrative   Married (husband patient at [facility]). 3 children. 7 grandkids.       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 Difficulty of Paying Living Expenses: Not hard at all  Food Insecurity: No Food Insecurity  . Worried About Charity fundraiser in the Last Year: Never true  . Ran Out of Food in the Last Year: Never true  Transportation Needs: No Transportation Needs  . Lack of Transportation (Medical): No  . Lack of Transportation (Non-Medical): No  Physical Activity: Insufficiently Active  . Days of Exercise per Week: 3 days  . Minutes of Exercise per Session: 40 min  Stress: No Stress Concern Present  . Feeling of Stress : Not at all  Social Connections: Moderately Integrated  . Frequency of Communication with Friends and Family: More than three times a week  . Frequency of Social Gatherings with Friends and Family: More than three times a week  . Attends Religious Services: More than 4 times per year  . Active Member of Clubs or Organizations: No  . Attends Archivist Meetings:  Never  . Marital Status: Married    Tobacco Counseling Counseling given: Not Answered   Clinical Intake:  Pre-visit preparation completed: Yes  Pain : No/denies pain     BMI - recorded: 35.15 Nutritional Status: BMI > 30  Obese Nutritional Risks: None Diabetes: No  How often do you need to have someone help you when you read instructions, pamphlets, or other written materials from your doctor or pharmacy?: 1 - Never  Diabetic?No  Interpreter Needed?: No  Information entered by :: Charlott Rakes, LPN   Activities of Daily Living In your present state of health, do you have any difficulty performing the following activities: 10/22/2019 10/12/2019  Hearing?  N N  Vision? N N  Difficulty concentrating or making decisions? N N  Walking or climbing stairs? Y N  Comment pressure on the foot bothers me at times, limit use on stairs -  Dressing or bathing? N N  Doing errands, shopping? N N  Preparing Food and eating ? N -  Using the Toilet? N -  In the past six months, have you accidently leaked urine? N -  Do you have problems with loss of bowel control? N -  Managing your Medications? N -  Managing your Finances? N -  Housekeeping or managing your Housekeeping? N -  Some recent data might be hidden    Patient Care Team: Marin Olp, MD as PCP - General (Family Medicine) Paula Compton, MD as Consulting Physician (Obstetrics and Gynecology) Rolm Bookbinder, MD as Consulting Physician (Dermatology) Letta Pate Luanna Salk, MD as Consulting Physician (Physical Medicine and Rehabilitation) Marygrace Drought, MD as Consulting Physician (Ophthalmology) Garrel Ridgel, DPM as Consulting Physician (Podiatry) [name], [facility] as Pharmacist (Pharmacist)  Indicate any recent Medical Services you may have received from other than [facility] providers in the past year (date may be approximate).     Assessment:   This is a routine wellness examination for [place].  Hearing/Vision  screen  Hearing Screening   125Hz  250Hz  500Hz  1000Hz  2000Hz  3000Hz  4000Hz  6000Hz  8000Hz   Right ear:           Left ear:           Comments: Pt denies any hearing difficulty at this time  Vision Screening Comments: Follows up with Dr Satira Sark annually for eye exams   Dietary issues and exercise activities discussed: Current Exercise Habits: Home exercise routine, Type of exercise: Other - see comments;strength training/weights (eliptical and stationary bike), Time (Minutes): 25, Frequency (Times/Week): 3, Weekly Exercise (Minutes/Week): 75  Goals    . Exercise 150 minutes per week (moderate activity)     Will bump up to 5 days at [employer] Will bike or walk in between the arch trainer     . Patient Stated     Reduce cholesterol and glucose level    . PharmD Care Plan     CARE PLAN ENTRY (see longitudinal plan of care for additional care plan information)  Current Barriers:  . Chronic Disease Management support, education, and care coordination needs related to Hypertension, Hyperlipidemia, hyperglycemia, and depression.   Hypertension BP Readings from Last 3 Encounters:  03/29/19 130/80  11/28/18 122/66  11/17/18 123/62   . Pharmacist Clinical Goal(s): o Over the next 180 days, patient will work with PharmD and providers to maintain BP goal <130/80 . Current regimen:  o Amlodipine-valsartan 5-320 mg once daily . Interventions: o Continue current management . Patient self care activities - Over the next 180 days, patient will: o Check BP at least once every 1-2 weeks, document, and provide at future appointments o Ensure daily salt intake < 2300 mg/day  Hyperlipidemia Lab Results  Component Value Date/Time   LDLCALC 179 (H) 09/21/2018 10:42 AM   LDLDIRECT 151.0 03/29/2019 10:24 AM   . Pharmacist Clinical Goal(s): o Over the next 180 days, patient will work with PharmD and providers to achieve LDL goal < 100; longitudinal LDL goal <70. . Current regimen:  o Crestor  5 mg once WEEKLY o Red yeast rice extract daily  . Interventions: o Diet/exercise recommendations . Patient self care activities - Over the next 180 days, patient will: o Continue current management  Hyperglycemia Lab Results  Component Value Date/Time   HGBA1C 6.1 03/29/2019 10:24 AM   HGBA1C 5.5 03/29/2019 10:16 AM   HGBA1C 6.3 09/21/2018 10:42 AM   . Pharmacist Clinical Goal(s): o Over the next 180 days, patient will work with PharmD and providers to maintain A1c goal <6.5% . Current regimen:  o No current medications . Interventions: o Diet/exercise recommendations . Patient self care activities - Over the next 180 days, patient will: o Replace main sources of carbohydrates with fruits/vegetables   Depression . Pharmacist Clinical Goal(s) o Over the next 180 days, patient will work with PharmD and providers to minimize symptoms of depression . Current regimen:  o Escitalopram 10 mg once daily . Interventions: o Continue current management . Patient self care activities - Over the next 180 days, patient will: o Continue current management  Medication management . Pharmacist Clinical Goal(s): o Over the next 180 days, patient will work with PharmD and providers to maintain optimal medication adherence . Current pharmacy: [facility] . Interventions o Comprehensive medication review performed. o Continue current medication management strategy . Patient self care activities - Over the next 180 days, patient will: o Take medications as prescribed o Report any questions or concerns to PharmD and/or provider(s) Initial goal documentation.    . Weight (lb) < 170 lb (77.1 kg)     Check out  online nutrition programs as GumSearch.nl and http://vang.com/;  Calorie king.com  Look for foods with "whole" wheat; bran; oatmeal etc Shot at the farmer's markets in season for fresher choices  Watch for "hydrogenated" on the label of oils which are trans-fats.  Watch for  "high fructose corn syrup" in snacks, yogurt or ketchup  Meats have less marbling; bright colored fruits and vegetables;  Canned; dump out liquid and wash vegetables. Be mindful of what we are eating  Portion control is essential to a health weight! Sit down; take a break and enjoy your meal; take smaller bites; put the fork down between bites;  It takes 20 minutes to get full; so check in with your fullness cues and stop eating when you start to fill full             Depression Screen PHQ 2/9 Scores 10/22/2019 10/02/2019 03/29/2019 10/05/2018 09/21/2018 05/30/2018 03/27/2018  PHQ - 2 Score 0 0 0 0 0 0 0  PHQ- 9 Score - 0 0 - 0 - 0    Fall Risk Fall Risk  10/22/2019 10/12/2019 10/05/2018 09/21/2018 09/04/2018  Falls in the past year? 0 0 0 0 0  Number falls in past yr: 0 0 0 0 -  Injury with Fall? 0 0 0 0 -  Risk for fall due to : Impaired vision;Impaired mobility;Impaired balance/gait - - - -  Risk for fall due to: Comment weakness on ankle at times and affects walking - - - -  Follow up - - Education provided;Falls evaluation completed - -    Any stairs in or around the home? Yes  If so, are there any without handrails? No  Home free of loose throw rugs in walkways, pet beds, electrical cords, etc? Yes  Adequate lighting in your home to reduce risk of falls? Yes   ASSISTIVE DEVICES UTILIZED TO PREVENT FALLS:  Life alert? No  Use of a cane, walker or w/c? No  Grab bars in the bathroom? No  Shower chair or bench in shower? No  Elevated toilet seat or a handicapped toilet? No   TIMED UP  AND GO:  Was the test performed? No .      Cognitive Function: MMSE - Mini Mental State Exam 04/01/2016  Not completed: (No Data)     6CIT Screen 10/22/2019 10/05/2018 09/14/2017  What Year? 0 points 0 points 0 points  What month? 0 points 0 points 0 points  What time? - 0 points 0 points  Count back from 20 0 points 0 points 0 points  Months in reverse 0 points 0 points 0 points  Repeat phrase  0 points 0 points -  Total Score - 0 -    Immunizations Immunization History  Administered Date(s) Administered  . Influenza,inj,Quad PF,6+ Mos 11/15/2012, 10/30/2015, 10/12/2019  . Influenza-Unspecified 09/25/2013, 11/10/2014, 10/29/2016  . PFIZER SARS-COV-2 Vaccination 02/15/2019, 03/08/2019  . Pneumococcal Conjugate-13 03/08/2014  . Pneumococcal Polysaccharide-23 03/13/2015  . Td 01/26/2003  . Tdap 10/16/2015  . Zoster 12/12/2009    TDAP status: Up to date Flu Vaccine status: Up to date Pneumococcal vaccine status: Up to date Covid-19 vaccine status: Completed vaccines  Qualifies for Shingles Vaccine? Yes   Zostavax completed Yes   Shingrix Completed?: No.    Education has been provided regarding the importance of this vaccine. Patient has been advised to call insurance company to determine out of pocket expense if they have not yet received this vaccine. Advised may also receive vaccine at local pharmacy or Health Dept. Verbalized acceptance and understanding.  Screening Tests Health Maintenance  Topic Date Due  . PAP SMEAR-Modifier  12/18/2019  . MAMMOGRAM  05/30/2021  . COLONOSCOPY  08/10/2022  . TETANUS/TDAP  10/15/2025  . INFLUENZA VACCINE  Completed  . DEXA SCAN  Completed  . COVID-19 Vaccine  Completed  . Hepatitis C Screening  Completed  . PNA vac Low Risk Adult  Completed    Health Maintenance  Health Maintenance Due  Topic Date Due  . PAP SMEAR-Modifier  12/18/2019    Colorectal cancer screening: Completed 08/09/12. Repeat every 10 years Mammogram status: Completed 05/31/19. Repeat every year Bone Density status: Completed 05/31/19. Results reflect: Bone density results: NORMAL. Repeat every 2 years.    Additional Screening:  Hepatitis C Screening: Completed 03/13/15  Vision Screening: Recommended annual ophthalmology exams for early detection of glaucoma and other disorders of the eye. Is the patient up to date with their annual eye exam?  Yes  Who  is the provider or what is the name of the office in which the patient attends annual eye exams? Dr Satira Sark  Dental Screening: Recommended annual dental exams for proper oral hygiene  Community Resource Referral / Chronic Care Management: CRR required this visit?  No   CCM required this visit?  No      Plan:     I have personally reviewed and noted the following in the patient's chart:   . Medical and social history . Use of alcohol, tobacco or illicit drugs  . Current medications and supplements . Functional ability and status . Nutritional status . Physical activity . Advanced directives . List of other physicians . Hospitalizations, surgeries, and ER visits in previous 12 months . Vitals . Screenings to include cognitive, depression, and falls . Referrals and appointments  In addition, I have reviewed and discussed with patient certain preventive protocols, quality metrics, and best practice recommendations. A written personalized care plan for preventive services as well as general preventive health recommendations were provided to patient.     Willette Brace, LPN   2/95/1884   Nurse Notes: None

## 2019-12-05 DIAGNOSIS — H0100A Unspecified blepharitis right eye, upper and lower eyelids: Secondary | ICD-10-CM | POA: Diagnosis not present

## 2019-12-05 DIAGNOSIS — H43813 Vitreous degeneration, bilateral: Secondary | ICD-10-CM | POA: Diagnosis not present

## 2019-12-05 DIAGNOSIS — H401132 Primary open-angle glaucoma, bilateral, moderate stage: Secondary | ICD-10-CM | POA: Diagnosis not present

## 2019-12-05 DIAGNOSIS — H524 Presbyopia: Secondary | ICD-10-CM | POA: Diagnosis not present

## 2019-12-28 ENCOUNTER — Encounter: Payer: Self-pay | Admitting: Family Medicine

## 2019-12-28 DIAGNOSIS — D485 Neoplasm of uncertain behavior of skin: Secondary | ICD-10-CM | POA: Diagnosis not present

## 2019-12-28 DIAGNOSIS — L570 Actinic keratosis: Secondary | ICD-10-CM | POA: Diagnosis not present

## 2019-12-28 DIAGNOSIS — D1801 Hemangioma of skin and subcutaneous tissue: Secondary | ICD-10-CM | POA: Diagnosis not present

## 2019-12-28 DIAGNOSIS — L853 Xerosis cutis: Secondary | ICD-10-CM | POA: Diagnosis not present

## 2019-12-28 DIAGNOSIS — L821 Other seborrheic keratosis: Secondary | ICD-10-CM | POA: Diagnosis not present

## 2019-12-28 DIAGNOSIS — D225 Melanocytic nevi of trunk: Secondary | ICD-10-CM | POA: Diagnosis not present

## 2019-12-28 DIAGNOSIS — D2262 Melanocytic nevi of left upper limb, including shoulder: Secondary | ICD-10-CM | POA: Diagnosis not present

## 2019-12-28 DIAGNOSIS — D2261 Melanocytic nevi of right upper limb, including shoulder: Secondary | ICD-10-CM | POA: Diagnosis not present

## 2019-12-28 DIAGNOSIS — L814 Other melanin hyperpigmentation: Secondary | ICD-10-CM | POA: Diagnosis not present

## 2020-01-08 ENCOUNTER — Other Ambulatory Visit: Payer: Self-pay | Admitting: Family Medicine

## 2020-01-31 ENCOUNTER — Other Ambulatory Visit: Payer: Self-pay | Admitting: Family Medicine

## 2020-02-28 ENCOUNTER — Other Ambulatory Visit: Payer: Self-pay | Admitting: Family Medicine

## 2020-02-28 DIAGNOSIS — Z1231 Encounter for screening mammogram for malignant neoplasm of breast: Secondary | ICD-10-CM

## 2020-03-04 ENCOUNTER — Telehealth: Payer: Self-pay

## 2020-03-04 NOTE — Chronic Care Management (AMB) (Signed)
Chronic Care Management Pharmacy Assistant   Name: Ann Washington  MRN: [id number] DOB: 08-23-1950  Reason for Encounter: Disease State/ General Adherence Call  PCP : Marin Olp, MD  Allergies:   Allergies  Allergen Reactions  . Amlodipine Besy-Benazepril Hcl Other (See Comments)    Edema to ankle with redness/ but now she is tolerating the current regiment    Medications: Outpatient Encounter Medications as of 03/04/2020  Medication Sig  . amLODipine-valsartan (EXFORGE) 5-320 MG tablet TAKE 1 TABLET BY MOUTH EVERY DAY  . APPLE CIDER VINEGAR PO Take 500 mg by mouth daily.   Marland Kitchen escitalopram (LEXAPRO) 5 MG tablet Take 1 tablet (5 mg total) by mouth daily.  . fluticasone (FLONASE) 50 MCG/ACT nasal spray SPRAY 2 SPRAYS INTO EACH NOSTRIL EVERY DAY  . latanoprost (XALATAN) 0.005 % ophthalmic solution 1 drop at bedtime.  . Multiple Vitamin (MULTIVITAMIN) tablet Take 1 tablet by mouth daily.    Marland Kitchen omeprazole (PRILOSEC) 20 MG capsule Take 20 mg by mouth daily.  . Red Yeast Rice Extract (RED YEAST RICE PO) Take 2 tablets by mouth at bedtime.  . rosuvastatin (CRESTOR) 5 MG tablet Take 1 tablet (5 mg total) by mouth 2 (two) times a week.  . timolol (TIMOPTIC) 0.5 % ophthalmic solution Place 1 drop into both eyes 2 (two) times daily.  Marland Kitchen zolpidem (AMBIEN) 5 MG tablet TAKE 1 TABLET BY MOUTH EVERYDAY AT BEDTIME   No facility-administered encounter medications on file as of 03/04/2020.    Current Diagnosis: Patient Active Problem List   Diagnosis Date Noted  . Osteoarthritis of ankle and foot 12/08/2018  . 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 Normocytic anemia 09/12/2014  . CKD (chronic kidney disease), stage III ([place]) 03/14/2014  . Glaucoma 03/08/2014  . Insomnia 03/08/2014  . Stress incontinence, female 10/12/2011  . Vaginal atrophy 05/27/2011  . Hyperglycemia 12/09/2009  . Obesity 10/11/2008  . GERD- failed h2 blocker 08/25/2007  . Hyperlipidemia 10/13/2006  . Major depression,  recurrent, full remission ([place]) 10/13/2006  . Hypertension, essential 10/10/2006    Have you seen any other providers since your last visit with Ann Washington, Pharm.D., BCGP?  10/12/2019 Wonda Cerise, MD  Have you had any problems recently with your health?  Patient states he has a weakness in her left foot/ankle. Patient states she had surgery about 3 years ago and may need to have surgery again. Patient states she has an appointment with an orthopedic surgeon at [place] on 03/19/2020  Have you had any problems with your pharmacy?  Patient states she hasn't had any problems with her pharmacy. Patient did note that their automated reminder calls for prescription refills are annoying. Patient states she gets an automated reminder call for prescriptions that she has already picked up.  What issues or side effects are you having with your medications?  Patient states she is not currently having any issues or side effects from any of her medications at this time.  What would you like me to pass along to Ann Washington, Kirby.D., BCGP for them to help you with?   Patient states she doesn't have anything for me to pass along to Ann Washington, CPP at this time.  What can we do to take care of you better?  Patient states "I think I am doing okay."  Patient states she does not currently monitor her blood pressure at home as advised.  Future Appointments  Date Time Provider [place]  04/01/2020  1:00  PM [facility] CCM PHARMACIST [facility]  04/04/2020  8:00 AM Marin Olp, MD [facility]  06/05/2020 10:00 AM [facility] MM 3 [facility]  10/24/2020  8:45 AM [facility] HEALTH COACH [facility]   April D Calhoun, [place] Pharmacist Assistant [phone]   Follow-Up:  Pharmacist Review

## 2020-04-01 ENCOUNTER — Ambulatory Visit: Payer: Medicare Other

## 2020-04-01 NOTE — Chronic Care Management (AMB) (Signed)
  Chronic Care Management   Outreach Note   Name: Ann Washington MRN: [id number] DOB: 02/14/1950  Referred by: Marin Olp, MD Reason for referral: Telephone Appointment with [place] Pharmacist, [name].   An unsuccessful telephone outreach was attempted today. The patient was referred to the pharmacist for assistance with care management and care coordination.   Telephone appointment with clinical pharmacist today (04/01/2020) at 2pm. If patient immediately returns call, transfer to [phone]. Otherwise, please provide this number so patient can reschedule visit.   [name], Pharm.D., BCGP Clinical Pharmacist [facility] [phone]

## 2020-04-03 NOTE — Patient Instructions (Addendum)
Team we ended up doing a physical today-please refund copay  Please check with your pharmacy to see if they have the shingrix vaccine. If they do- please get this immunization and update [place] by phone call or mychart with dates you receive the vaccine  Please stop by lab before you go If you have mychart- we will send your results within 3 business days of [place] receiving them.  If you do not have mychart- we will call you about results within 5 business days of [place] receiving them.  *please also note that you will see labs on mychart as soon as they post. I will later go in and write notes on them- will say "notes from Dr. Yong Channel"  Recommended follow up: Return in about 6 months (around 10/05/2020) for follow up- or sooner if needed.

## 2020-04-03 NOTE — Progress Notes (Signed)
.  Phone [phone]   Subjective:  Patient presents today for their annual physical. Chief complaint-noted.   See problem oriented charting- ROS- full  review of systems was completed and negative except for:  Some ankle pain but much improved  The following were reviewed and entered/updated in epic: Past Medical History:  Diagnosis Date  . CIN I (cervical intraepithelial neoplasia I)    around age 70  . Depression   . 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 GERD (gastroesophageal reflux disease)   . Glaucoma   . Hyperlipidemia   . Hypertension   . Snoring 03/08/2008   Tested 2007 per patient-unrevealing for sleep apnea     Patient Active Problem List   Diagnosis Date Noted  . CKD (chronic kidney disease), stage III ([place]) 03/14/2014    Priority: Medium  . Glaucoma 03/08/2014    Priority: Medium  . Insomnia 03/08/2014    Priority: Medium  . Hyperglycemia 12/09/2009    Priority: Medium  . Hyperlipidemia 10/13/2006    Priority: Medium  . Major depression, recurrent, full remission ([place]) 10/13/2006    Priority: Medium  . Hypertension, essential 10/10/2006    Priority: Medium  . Normocytic anemia 09/12/2014    Priority: Low  . Stress incontinence, female 10/12/2011    Priority: Low  . Vaginal atrophy 05/27/2011    Priority: Low  . Obesity 10/11/2008    Priority: Low  . GERD- failed h2 blocker 08/25/2007    Priority: Low  . Osteoarthritis of ankle and foot 12/08/2018  . 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 CATARACT EXTRACTION     late 2019  . COLONOSCOPY    . COLPOSCOPY     age 16  . GYNECOLOGIC CRYOSURGERY     age 13  . HAMMER TOE SURGERY    . TUBAL LIGATION      Family History  Problem Relation Age of Onset  . Diabetes Sister   . Hypertension Sister   . Alzheimer's disease Mother   . Hip fracture Mother   . Osteoporosis Mother   . Deep vein thrombosis Father    . Heart disease Father        MI 71s, nonsmoker  . Hypertension Brother   . Diabetes Maternal Grandmother   . Stroke Maternal Grandfather   . Depression Paternal Grandmother   . Suicidality Paternal Grandfather   . Depression Paternal Grandfather   . Breast cancer Sister   . Arthritis Sister   . Colon cancer Neg Hx     Medications- reviewed and updated Current Outpatient Medications  Medication Sig Dispense Refill  . amLODipine-valsartan (EXFORGE) 5-320 MG tablet TAKE 1 TABLET BY MOUTH EVERY DAY 90 tablet 3  . APPLE CIDER VINEGAR PO Take 500 mg by mouth daily.     Marland Kitchen escitalopram (LEXAPRO) 5 MG tablet Take 1 tablet (5 mg total) by mouth daily. 90 tablet 3  . fluticasone (FLONASE) 50 MCG/ACT nasal spray SPRAY 2 SPRAYS INTO EACH NOSTRIL EVERY DAY 48 mL 1  . latanoprost (XALATAN) 0.005 % ophthalmic solution 1 drop at bedtime.    . Multiple Vitamin (MULTIVITAMIN) tablet Take 1 tablet by mouth daily.    Marland Kitchen omeprazole (PRILOSEC) 20 MG capsule Take 20 mg by mouth daily.    . Red Yeast Rice Extract (RED YEAST RICE PO) Take 2 tablets by mouth at bedtime.    . rosuvastatin (CRESTOR) 5 MG  tablet Take 1 tablet (5 mg total) by mouth 2 (two) times a week. 26 tablet 3  . timolol (TIMOPTIC) 0.5 % ophthalmic solution Place 1 drop into both eyes 2 (two) times daily.  0  . zolpidem (AMBIEN) 5 MG tablet TAKE 1 TABLET BY MOUTH EVERYDAY AT BEDTIME 30 tablet 5   No current facility-administered medications for this visit.    Allergies-reviewed and updated Allergies  Allergen Reactions  . Amlodipine Besy-Benazepril Hcl Other (See Comments)    Edema to ankle with redness/ but now she is tolerating the current regiment    Social History   Social History Narrative   Married (husband patient at [facility]). 3 children. 7 grandkids.       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 Oropharynx normal Neck: no thyromegaly CV: RRR no murmurs rubs or gallops Lungs: CTAB no crackles, wheeze, rhonchi Abdomen: soft/nontender/nondistended/normal bowel sounds. No rebound or guarding.  Ext: no edema Skin: warm, dry Neuro: grossly normal, moves all extremities, PERRLA   Assessment and Plan   70 y.o. female presenting for annual physical.  Health Maintenance counseling: 1. Anticipatory guidance: Patient counseled regarding regular dental exams -q6 months, eye exams - yearly,  avoiding smoking and second hand smoke , limiting alcohol to 1 beverage per day- well under that .   2. Risk factor reduction:  Advised patient of need for regular exercise and diet rich and fruits and vegetables to reduce risk of heart attack and stroke. Exercise- [employer] doing ellpitcal and bicycle 3x a week. Diet- has been trying to drink more water. Cutting down on doritos/chips/snacking Wt Readings from Last 3 Encounters:  04/04/20 177 lb 12.8 oz (80.6 kg)  10/12/19 180 lb (81.6 kg)  03/29/19 182 lb 3.2 oz (82.6 kg)  3. Immunizations/screenings/ancillary studies- discussed shingrix at pharmacy Immunization History  Administered Date(s) Administered  . Influenza,inj,Quad PF,6+ Mos 11/15/2012, 10/30/2015, 10/12/2019  . Influenza-Unspecified 09/25/2013, 11/10/2014, 10/29/2016  . PFIZER(Purple Top)SARS-COV-2 Vaccination 02/15/2019, 03/08/2019, 11/09/2019  . Pneumococcal Conjugate-13 03/08/2014  . Pneumococcal Polysaccharide-23 03/13/2015  . Td 01/26/2003  . Tdap 10/16/2015  . Zoster 12/12/2009  4. Cervical cancer screening- Dr. Marvel Plan did one last year- past formal age based screening requirements 5. Breast cancer screening-  breast exam with GYn and mammogram 05/31/19 6.  Colon cancer screening - 08/09/12 with 10 year repeat 7. Skin cancer screening- Dr. Ubaldo Glassing [place] derm. advised regular sunscreen use. Denies worrisome, changing, or new skin lesions.  8. Birth control/STD check- monogamous and post menopause 9. Osteoporosis screening at 22- 05/31/19  normal -Never smoker  Status of chronic or acute concerns   # Dr. Doran Durand did an injection  In foot recently below the fibula- she has had some relief of foot pain with this. Fixing would take surgery.   #Hypertension/CKD stage III S: Compliant with amlodipine-valsartan 5-320 mg.  Knows to avoid NSAIDs-Celebrex prescribed by Dr. Milinda Pointer in past is no longer on  Valsartan is protective in case there is a proteinuric element. BP Readings from Last 3 Encounters:  04/04/20 138/84  10/12/19  128/84  03/29/19 130/80  A/P: high normal today- encouraged home monitoring- continue current meds   #Hyperlipidemia S: Poorly controlled on red yeast rice alone in past now on crestor up to twice a week 5 mg.  Myalgias on pravastatin in the past. Lab Results  Component Value Date   CHOL 212 (H) 10/12/2019   HDL 55 10/12/2019   LDLCALC 135 (H) 10/12/2019   LDLDIRECT 151.0 03/29/2019   TRIG 114 10/12/2019   CHOLHDL 3.9 10/12/2019  A/P: hopefully improved- update LDL today    #Hyperglycemia S: Increased risk of diabetes.  No current medication.  Lifestyle modification only. Lab Results  Component Value Date   HGBA1C 6.0 (H) 10/12/2019  A/P: hopefully improved with weight loss- update levels today    #GERD S: Has been well controlled on Prilosec. zantac was not helpful for her.  A/P: reasonable control - continue current meds Lab Results  Component Value Date   [id number] 09/15/2017  update b12 with labs with high risk med use   #Depression in full remission  S: Compliant with Lexapro 5 mg.  Depression screen [facility] 2/9 04/04/2020 10/22/2019 10/02/2019  Decreased Interest 0 0 0  Down, Depressed, Hopeless 0 0 0  PHQ - 2  Score 0 0 0  Altered sleeping 0 - 0  Tired, decreased energy 0 - 0  Change in appetite 0 - 0  Feeling bad or failure about yourself  0 - 0  Trouble concentrating 0 - 0  Moving slowly or fidgety/restless 0 - 0  Suicidal thoughts 0 - 0  PHQ-9 Score 0 - 0  Difficult doing work/chores Not difficult at all - -  Some recent data might be hidden  A/P: has done well despite decrease to 5 mg- continue current meds -if world in more stable place next visit consider reduction  Recommended follow up:  Return in about 6 months (around 10/05/2020) for follow up- or sooner if needed. Future Appointments  Date Time Provider [place]  06/05/2020 10:00 AM [facility] MM 3 [facility]  10/24/2020  8:45 AM [facility] HEALTH COACH [facility]   Lab/Order associations:   ICD-10-CM   1. Preventative health care  Z00.00   2. Hypertension, essential  I10 CBC with Differential/Platelet    Comprehensive metabolic panel    LDL cholesterol, direct  3. Gastroesophageal reflux disease without esophagitis  K21.9   4. Hyperlipidemia, unspecified hyperlipidemia type  E78.5 CBC with Differential/Platelet    Comprehensive metabolic panel    LDL cholesterol, direct  5. High risk medication use  Z79.899 Vitamin B12  6. Hyperglycemia  R73.9 Hemoglobin A1c   Return precautions advised.  Garret Reddish, MD

## 2020-04-04 ENCOUNTER — Ambulatory Visit: Payer: Medicare Other | Admitting: Family Medicine

## 2020-04-04 ENCOUNTER — Other Ambulatory Visit: Payer: Self-pay

## 2020-04-04 ENCOUNTER — Encounter: Payer: Self-pay | Admitting: Family Medicine

## 2020-04-04 VITALS — BP 138/84 | HR 60 | Temp 97.7°F | Ht 60.0 in | Wt 177.8 lb

## 2020-04-04 DIAGNOSIS — E785 Hyperlipidemia, unspecified: Secondary | ICD-10-CM | POA: Diagnosis not present

## 2020-04-04 DIAGNOSIS — I10 Essential (primary) hypertension: Secondary | ICD-10-CM

## 2020-04-04 DIAGNOSIS — K219 Gastro-esophageal reflux disease without esophagitis: Secondary | ICD-10-CM

## 2020-04-04 DIAGNOSIS — Z79899 Other long term (current) drug therapy: Secondary | ICD-10-CM

## 2020-04-04 DIAGNOSIS — Z0000 Encounter for general adult medical examination without abnormal findings: Secondary | ICD-10-CM | POA: Diagnosis not present

## 2020-04-04 DIAGNOSIS — R739 Hyperglycemia, unspecified: Secondary | ICD-10-CM | POA: Diagnosis not present

## 2020-04-04 DIAGNOSIS — F3342 Major depressive disorder, recurrent, in full remission: Secondary | ICD-10-CM

## 2020-04-04 LAB — COMPREHENSIVE METABOLIC PANEL
ALT: 15 U/L (ref 0–35)
AST: 17 U/L (ref 0–37)
Albumin: 4.3 g/dL (ref 3.5–5.2)
Alkaline Phosphatase: 57 U/L (ref 39–117)
BUN: 20 mg/dL (ref 6–23)
CO2: 30 mEq/L (ref 19–32)
Calcium: 10 mg/dL (ref 8.4–10.5)
Chloride: 102 mEq/L (ref 96–112)
Creatinine, Ser: 0.84 mg/dL (ref 0.40–1.20)
GFR: 69.99 mL/min (ref >60.00)
Glucose, Bld: 100 mg/dL — ABNORMAL HIGH (ref 70–99)
Potassium: 4.1 mEq/L (ref 3.5–5.1)
Sodium: 138 mEq/L (ref 135–145)
Total Bilirubin: 0.7 mg/dL (ref 0.2–1.2)
Total Protein: 7.7 g/dL (ref 6.0–8.3)

## 2020-04-04 LAB — CBC WITH DIFFERENTIAL/PLATELET
Basophils Absolute: 0 10*3/uL (ref 0.0–0.1)
Basophils Relative: 0.6 % (ref 0.0–3.0)
Eosinophils Absolute: 0.3 10*3/uL (ref 0.0–0.7)
Eosinophils Relative: 3.1 % (ref 0.0–5.0)
HCT: 40 % (ref 36.0–46.0)
Hemoglobin: 13.6 g/dL (ref 12.0–15.0)
Lymphocytes Relative: 27.8 % (ref 12.0–46.0)
Lymphs Abs: 2.3 10*3/uL (ref 0.7–4.0)
MCHC: 34 g/dL (ref 30.0–36.0)
MCV: 91.3 fl (ref 78.0–100.0)
Monocytes Absolute: 0.8 10*3/uL (ref 0.1–1.0)
Monocytes Relative: 9.3 % (ref 3.0–12.0)
Neutro Abs: 4.8 10*3/uL (ref 1.4–7.7)
Neutrophils Relative %: 59.2 % (ref 43.0–77.0)
Platelets: 232 10*3/uL (ref 150.0–400.0)
RBC: 4.38 Mil/uL (ref 3.87–5.11)
RDW: 13 % (ref 11.5–15.5)
WBC: 8.1 10*3/uL (ref 4.0–10.5)

## 2020-04-04 LAB — LDL CHOLESTEROL, DIRECT: Direct LDL: 132 mg/dL

## 2020-04-04 LAB — VITAMIN B12: Vitamin B-12: 695 pg/mL (ref 211–911)

## 2020-04-04 LAB — HEMOGLOBIN A1C: Hgb A1c MFr Bld: 6.1 % (ref 4.6–6.5)

## 2020-04-11 ENCOUNTER — Other Ambulatory Visit: Payer: Self-pay | Admitting: Family Medicine

## 2020-04-14 ENCOUNTER — Ambulatory Visit (INDEPENDENT_AMBULATORY_CARE_PROVIDER_SITE_OTHER): Payer: Medicare Other

## 2020-04-14 DIAGNOSIS — E785 Hyperlipidemia, unspecified: Secondary | ICD-10-CM

## 2020-04-14 DIAGNOSIS — R739 Hyperglycemia, unspecified: Secondary | ICD-10-CM

## 2020-04-14 DIAGNOSIS — I10 Essential (primary) hypertension: Secondary | ICD-10-CM

## 2020-04-14 NOTE — Patient Instructions (Addendum)
Ann Washington,  Thank you for taking the time to review your medications with me today.  I have included our care plan/goals in the following pages. Please review and call me at [phone] with any questions!  Thanks! Ellin Mayhew, Pharm.D., BCGP Clinical Pharmacist [facility] [place] [phone] Patient Care Plan: [place] Plan    Problem Identified: HTN HLD CKD III Obesity   Priority: High    Goal: Disease Management   Start Date: 04/14/2020  Expected End Date: 04/14/2021  This Visit's Progress: On track  Priority: High  Note:   Hypertension (BP goal <130/80) -Controlled -Current treatment: . Amlodipine 5-320 mg once daily -Medications previously tried: Azor 5-40 mg once daily   -Denies hypotensive/hypertensive symptoms -Educated on BP goals and benefits of medications for prevention of heart attack, stroke and kidney damage; Daily salt intake goal < 2300 mg; Exercise goal of 150 minutes per week; -monitor BP at home at le, document, and provide log at future appointments -Counseled on diet and exercise extensively Recommended to continue current medication  Hyperlipidemia: (LDL goal < 100) -Not ideally controlled -Current treatment: . Rosuvastatin 5 mg twice WEEKLY -Medications previously tried: pravastatin 20 mg daily (sore muscle), fenofibrate 150 mg, rosuvastatin 5 mg once weekly,   -Educated on Cholesterol goals;  Benefits of statin for ASCVD risk reduction; -Counseled on diet and exercise extensively. Had in depth discussion on rosuvastatin and metformin and risks/benefits to each along with risk factors for cardiovascular disease progression from preDM to DM.  Recommended increase to rosuvastatin 5 mg three times weekly - agreeable to starting this today - PCP updated.  Prediabetes (A1c goal < 5.7) -Not ideally controlled  -03/2020 FBG 100, a1c 6.1% -has screened at least annually  -HTN, HLD, BMI  ~35 -Current medications: . N/a - previously declined metformin -Medications previously tried: n/a  -Current home glucose readings -Educated on A1c and blood sugar goals; Exercise goal of 150 minutes per week; Carbohydrate counting and/or plate method -Counseled on diet and exercise extensively Recommended pt to start on low dose metformin - declined at this time.  Current Barriers:  . Unable to independently afford treatment regimen . Suboptimal therapeutic regimen for HLD PreDM  Pharmacist Clinical Goal(s):  Marland Kitchen Over the next 365 days, patient will verbalize ability to afford treatment regimen . achieve adherence to monitoring guidelines and medication adherence to achieve therapeutic efficacy through collaboration with PharmD and provider.   Interventions: . 1:1 collaboration with Marin Olp, MD regarding development and update of comprehensive plan of care as evidenced by provider attestation and co-signature . Inter-disciplinary care team collaboration (see longitudinal plan of care) . Comprehensive medication review performed; medication list updated in electronic medical record  Patient Goals/Self-Care Activities . Over the next 365 days, patient will:  - take medications as prescribed target a minimum of 150 minutes of moderate intensity exercise weekly engage in dietary modifications by following plate method discussed  Medication Assistance: None required.  Patient affirms current coverage meets needs.     The patient verbalized understanding of instructions provided today and agreed to receive a Mailed copy of patient instruction and/or educational materials. Telephone follow up appointment with pharmacy team member scheduled for: See next appointment with "Care Management Staff" under "What's Next" below.   Prediabetes Prediabetes is when your blood sugar (blood glucose) level is higher than normal but not high enough for you to be diagnosed with type 2 diabetes.  Having  prediabetes puts you at risk for developing type 2 diabetes (type 2 diabetes mellitus). With certain lifestyle changes, you may be able to prevent or delay the onset of type 2 diabetes. This is important because type 2 diabetes can lead to serious complications, such as:  Heart disease.  Stroke.  Blindness.  Kidney disease.  Depression.  Poor circulation in the feet and legs. In severe cases, this could lead to surgical removal of a leg (amputation). What are the causes? The exact cause of prediabetes is not known. It may result from insulin resistance. Insulin resistance develops when cells in the body do not respond properly to insulin that the body makes. This can cause excess glucose to build up in the blood. High blood glucose (hyperglycemia) can develop. What increases the risk? The following factors may make you more likely to develop this condition:  You have a family member with type 2 diabetes.  You are older than 45 years.  You had a temporary form of diabetes during a pregnancy (gestational diabetes).  You had polycystic ovary syndrome (PCOS).  You are overweight or obese.  You are inactive (sedentary).  You have a history of heart disease, including problems with cholesterol levels, high levels of blood fats, or high blood pressure. What are the signs or symptoms? You may have no symptoms. If you do have symptoms, they may include:  Increased hunger.  Increased thirst.  Increased urination.  Vision changes, such as blurry vision.  Tiredness (fatigue). How is this diagnosed? This condition can be diagnosed with blood tests. Your blood glucose may be checked with one or more of the following tests:  A fasting blood glucose (FBG) test. You will not be allowed to eat (you will fast) for at least 8 hours before a blood sample is taken.  An A1C blood test (hemoglobin A1C). This test provides information about blood glucose levels over the previous 2?3  months.  An oral glucose tolerance test (OGTT). This test measures your blood glucose at two points in time: ? After fasting. This is your baseline level. ? Two hours after you drink a beverage that contains glucose. You may be diagnosed with prediabetes if:  Your FBG is 100?125 mg/dL (5.6-6.9 mmol/L).  Your A1C level is 5.7?6.4% (39-46 mmol/mol).  Your OGTT result is 140?199 mg/dL (7.8-11 mmol/L). These blood tests may be repeated to confirm your diagnosis.   How is this treated? Treatment may include dietary and lifestyle changes to help lower your blood glucose and prevent type 2 diabetes from developing. In some cases, medicine may be prescribed to help lower the risk of type 2 diabetes. Follow these instructions at home: Nutrition  Follow a healthy meal plan. This includes eating lean proteins, whole grains, legumes, fresh fruits and vegetables, low-fat dairy products, and healthy fats.  Follow instructions from your health care provider about eating or drinking restrictions.  Meet with a dietitian to create a healthy eating plan that is right for you.   Lifestyle  Do moderate-intensity exercise for at least 30 minutes a day on 5 or more days each week, or as told by your health care provider. A mix of activities may be best, such as: ? Brisk walking, swimming, biking, and weight lifting.  Lose weight as told by your health care provider. Losing 5-7% of your body weight can reverse insulin resistance.  Do not drink alcohol if: ? Your health care provider tells you not to drink. ? You are pregnant, may be pregnant,  or are planning to become pregnant.  If you drink alcohol: ? Limit how much you use to:  0-1 drink a day for women.  0-2 drinks a day for men. ? Be aware of how much alcohol is in your drink. In the [place] one drink equals one 12 oz bottle of beer (355 mL), one 5 oz glass of wine (148 mL), or one 1 oz glass of hard liquor (44 mL). General instructions  Take  over-the-counter and prescription medicines only as told by your health care provider. You may be prescribed medicines that help lower the risk of type 2 diabetes.  Do not use any products that contain nicotine or tobacco, such as cigarettes, e-cigarettes, and chewing tobacco. If you need help quitting, ask your health care provider.  Keep all follow-up visits. This is important. Where to find more information  American Diabetes Association: www.diabetes.org  Academy of Nutrition and Dietetics: www.eatright.org  American Heart Association: www.heart.org Contact a health care provider if:  You have any of these symptoms: ? Increased hunger. ? Increased urination. ? Increased thirst. ? Fatigue. ? Vision changes, such as blurry vision. Get help right away if you:  Have shortness of breath.  Feel confused.  Vomit or feel like you may vomit. Summary  Prediabetes is when your blood sugar (blood glucose)level is higher than normal but not high enough for you to be diagnosed with type 2 diabetes.  Having prediabetes puts you at risk for developing type 2 diabetes (type 2 diabetes mellitus).  Make lifestyle changes such as eating a healthy diet and exercising regularly to help prevent diabetes. Lose weight as told by your health care provider. This information is not intended to replace advice given to you by your health care provider. Make sure you discuss any questions you have with your health care provider. Document Revised: 04/12/2019 Document Reviewed: 04/12/2019 Elsevier Patient Education  2021 [place].  Prediabetes Eating Plan Prediabetes is a condition that causes blood sugar (glucose) levels to be higher than normal. This increases the risk for developing type 2 diabetes (type 2 diabetes mellitus). Working with a health care provider or nutrition specialist (dietitian) to make diet and lifestyle changes can help prevent the onset of diabetes. These changes may help  you:  Control your blood glucose levels.  Improve your cholesterol levels.  Manage your blood pressure. What are tips for following this plan? Reading food labels  Read food labels to check the amount of fat, salt (sodium), and sugar in prepackaged foods. Avoid foods that have: ? Saturated fats. ? Trans fats. ? Added sugars.  Avoid foods that have more than 300 milligrams (mg) of sodium per serving. Limit your sodium intake to less than 2,300 mg each day. Shopping  Avoid buying pre-made and processed foods.  Avoid buying drinks with added sugar. Cooking  Cook with olive oil. Do not use butter, lard, or ghee.  Bake, broil, grill, steam, or boil foods. Avoid frying. Meal planning  Work with your dietitian to create an eating plan that is right for you. This may include tracking how many calories you take in each day. Use a food diary, notebook, or mobile application to track what you eat at each meal.  Consider following a Mediterranean diet. This includes: ? Eating several servings of fresh fruits and vegetables each day. ? Eating fish at least twice a week. ? Eating one serving each day of whole grains, beans, nuts, and seeds. ? Using olive oil instead of other  fats. ? Limiting alcohol. ? Limiting red meat. ? Using nonfat or low-fat dairy products.  Consider following a plant-based diet. This includes dietary choices that focus on eating mostly vegetables and fruit, grains, beans, nuts, and seeds.  If you have high blood pressure, you may need to limit your sodium intake or follow a diet such as the DASH (Dietary Approaches to Stop Hypertension) eating plan. The DASH diet aims to lower high blood pressure.   Lifestyle  Set weight loss goals with help from your health care team. It is recommended that most people with prediabetes lose 7% of their body weight.  Exercise for at least 30 minutes 5 or more days a week.  Attend a support group or seek support from a mental  health counselor.  Take over-the-counter and prescription medicines only as told by your health care provider. What foods are recommended? Fruits Berries. Bananas. Apples. Oranges. Grapes. Papaya. Mango. Pomegranate. Kiwi. Grapefruit. Cherries. Vegetables Lettuce. Spinach. Peas. Beets. Cauliflower. Cabbage. Broccoli. Carrots. Tomatoes. Squash. Eggplant. Herbs. Peppers. Onions. Cucumbers. Brussels sprouts. Grains Whole grains, such as whole-wheat or whole-grain breads, crackers, cereals, and pasta. Unsweetened oatmeal. Bulgur. Barley. Quinoa. Brown rice. Corn or whole-wheat flour tortillas or taco shells. Meats and other proteins Seafood. Poultry without skin. Lean cuts of pork and beef. Tofu. Eggs. Nuts. Beans. Dairy Low-fat or fat-free dairy products, such as yogurt, cottage cheese, and cheese. Beverages Water. Tea. Coffee. Sugar-free or diet soda. Seltzer water. Low-fat or nonfat milk. Milk alternatives, such as soy or almond milk. Fats and oils Olive oil. Canola oil. Sunflower oil. Grapeseed oil. Avocado. Walnuts. Sweets and desserts Sugar-free or low-fat pudding. Sugar-free or low-fat ice cream and other frozen treats. Seasonings and condiments Herbs. Sodium-free spices. Mustard. Relish. Low-salt, low-sugar ketchup. Low-salt, low-sugar barbecue sauce. Low-fat or fat-free mayonnaise. The items listed above may not be a complete list of recommended foods and beverages. Contact a dietitian for more information. What foods are not recommended? Fruits Fruits canned with syrup. Vegetables Canned vegetables. Frozen vegetables with butter or cream sauce. Grains Refined white flour and flour products, such as bread, pasta, snack foods, and cereals. Meats and other proteins Fatty cuts of meat. Poultry with skin. Breaded or fried meat. Processed meats. Dairy Full-fat yogurt, cheese, or milk. Beverages Sweetened drinks, such as iced tea and soda. Fats and oils Butter. Lard. Ghee. Sweets  and desserts Baked goods, such as cake, cupcakes, pastries, cookies, and cheesecake. Seasonings and condiments Spice mixes with added salt. Ketchup. Barbecue sauce. Mayonnaise. The items listed above may not be a complete list of foods and beverages that are not recommended. Contact a dietitian for more information. Where to find more information  American Diabetes Association: www.diabetes.org Summary  You may need to make diet and lifestyle changes to help prevent the onset of diabetes. These changes can help you control blood sugar, improve cholesterol levels, and manage blood pressure.  Set weight loss goals with help from your health care team. It is recommended that most people with prediabetes lose 7% of their body weight.  Consider following a Mediterranean diet. This includes eating plenty of fresh fruits and vegetables, whole grains, beans, nuts, seeds, fish, and low-fat dairy, and using olive oil instead of other fats. This information is not intended to replace advice given to you by your health care provider. Make sure you discuss any questions you have with your health care provider. Document Revised: 04/12/2019 Document Reviewed: 04/12/2019 Elsevier Patient Education  [place].

## 2020-04-14 NOTE — Progress Notes (Signed)
Chronic Care Management Pharmacy Note  04/14/2020 Name:  Ann Washington MRN:  [id number] DOB:  07/29/1950  Subjective: Ann Washington is an 70 y.o. year old female who is a primary patient of Hunter, Brayton Mars, MD.  The CCM team was consulted for assistance with disease management and care coordination needs.    Engaged with patient by telephone for follow up visit in response to provider referral for pharmacy case management and/or care coordination services.   Consent to Services:  The patient was given information about Chronic Care Management services, agreed to services, and gave verbal consent prior to initiation of services.  Please see initial visit note for detailed documentation.   Patient Care Team: Marin Olp, MD as PCP - General (Family Medicine) Paula Compton, MD as Consulting Physician (Obstetrics and Gynecology) Rolm Bookbinder, MD as Consulting Physician (Dermatology) Letta Pate Luanna Salk, MD as Consulting Physician (Physical Medicine and Rehabilitation) Marygrace Drought, MD as Consulting Physician (Ophthalmology) Garrel Ridgel, DPM as Consulting Physician (Podiatry) [name], [facility] as Pharmacist (Pharmacist)  Objective: Lab Results  Component Value Date   CREATININE 0.84 04/04/2020   CREATININE 0.78 10/12/2019   CREATININE 0.76 03/29/2019   GFR 69.99 04/04/2020   GFR 75.18 03/29/2019   GFRNONAA 77 10/12/2019   GFRNONAA 56.64 12/02/2009  Last diabetic Eye exam: No results found for: HMDIABEYEEXA  Last diabetic Foot exam: No results found for: HMDIABFOOTEX  Lab Results  Component Value Date   CHOL 212 (H) 10/12/2019   CHOL 261 (H) 09/21/2018   TRIG 114 10/12/2019   TRIG 146.0 09/21/2018   HDL 55 10/12/2019   HDL 52.40 09/21/2018   CHOLHDL 3.9 10/12/2019   CHOLHDL 5 09/21/2018   VLDL 29.2 09/21/2018   VLDL 25.4 03/23/2018   LDLCALC 135 (H) 10/12/2019   LDLCALC 179 (H) 09/21/2018   LDLDIRECT 132.0 04/04/2020   LDLDIRECT 151.0 03/29/2019    Hepatic Function Latest Ref Rng & Units 04/04/2020 10/12/2019 03/29/2019  Total Protein 6.0 - 8.3 g/dL 7.7 7.6 7.7  Albumin 3.5 - 5.2 g/dL 4.3 - 4.3  AST 0 - 37 U/L '17 17 18  ' ALT 0 - 35 U/L '15 12 13  ' Alk Phosphatase 39 - 117 U/L 57 - 54  Total Bilirubin 0.2 - 1.2 mg/dL 0.7 0.8 0.8  Bilirubin, Direct 0.0 - 0.3 mg/dL - - -   Lab Results  Component Value Date/Time   TSH 3.55 03/13/2015 09:08 AM   TSH 3.27 09/12/2014 10:42 AM   CBC Latest Ref Rng & Units 04/04/2020 10/12/2019 09/21/2018  WBC 4.0 - 10.5 K/uL 8.1 7.4 8.5  Hemoglobin 12.0 - 15.0 g/dL 13.6 12.8 13.7  Hematocrit 36.0 - 46.0 % 40.0 38.7 40.7  Platelets 150.0 - 400.0 K/uL 232.0 228 253.0   Lab Results  Component Value Date/Time   VD25OH 35 12/11/2010 09:44 AM    Clinical ASCVD: No  The 10-year ASCVD risk score Mikey Bussing DC Jr., et al., 2013) is: 16.3%   Values used to calculate the score:     Age: 59 years     Sex: Female     Is Non-Hispanic African American: No     Diabetic: No     Tobacco smoker: No     Systolic Blood Pressure: 094 mmHg     Is BP treated: Yes     HDL Cholesterol: 55 mg/dL     Total Cholesterol: 212 mg/dL    Social History   Tobacco Use  Smoking Status Never Smoker  Smokeless Tobacco Never Used   BP Readings from Last 3 Encounters:  04/04/20 138/84  10/12/19 128/84  03/29/19 130/80   Pulse Readings from Last 3 Encounters:  04/04/20 60  10/12/19 (!) 57  03/29/19 (!) 55   Wt Readings from Last 3 Encounters:  04/04/20 177 lb 12.8 oz (80.6 kg)  10/12/19 180 lb (81.6 kg)  03/29/19 182 lb 3.2 oz (82.6 kg)    Assessment: Review of patient past medical history, allergies, medications, health status, including review of consultants reports, laboratory and other test data, was performed as part of comprehensive evaluation and provision of chronic care management services.   SDOH:  (Social Determinants of Health) assessments and interventions performed:   CCM Care Plan Allergies  Allergen  Reactions  . Amlodipine Besy-Benazepril Hcl Other (See Comments)    Edema to ankle with redness/ but now she is tolerating the current regiment   Medications Reviewed Today    Reviewed by [name], [facility] (Pharmacist) on 04/14/20 at [address] List Status: <None>  Medication Order Taking? Sig Documenting Provider Last Dose Status Informant  amLODipine-valsartan (EXFORGE) 5-320 MG tablet [id number] No TAKE 1 TABLET BY MOUTH EVERY DAY Marin Olp, MD Taking Active   APPLE CIDER VINEGAR PO [id number] No Take 500 mg by mouth daily.  [provider] Taking Active   escitalopram (LEXAPRO) 5 MG tablet [id number] No Take 1 tablet (5 mg total) by mouth daily. Marin Olp, MD Taking Active   fluticasone [facility]) 50 MCG/ACT nasal spray [id number] No SPRAY 2 SPRAYS INTO EACH NOSTRIL EVERY DAY Marin Olp, MD Taking Active   latanoprost (XALATAN) 0.005 % ophthalmic solution [id number] No 1 drop at bedtime. [provider] Taking Active   Multiple Vitamin (MULTIVITAMIN) tablet [id number] No Take 1 tablet by mouth daily. [provider] Taking Active   omeprazole (PRILOSEC) 20 MG capsule [id number] No Take 20 mg by mouth daily. [provider] Taking Active   Red Yeast Rice Extract (RED YEAST RICE PO) [id number] No Take 2 tablets by mouth at bedtime. [provider] Taking Active   rosuvastatin (CRESTOR) 5 MG tablet [id number] No Take 1 tablet (5 mg total) by mouth 2 (two) times a week.  Patient taking differently: Take 5 mg by mouth 3 (three) times a week.   Marin Olp, MD Taking Active   timolol (TIMOPTIC) 0.5 % ophthalmic solution [id number] No Place 1 drop into both eyes 2 (two) times daily. [provider] Taking Active   zolpidem (AMBIEN) 5 MG tablet [id number]  TAKE 1 TABLET BY MOUTH EVERYDAY AT BEDTIME Marin Olp, MD  Active          Patient Active Problem List   Diagnosis Date Noted  . Osteoarthritis of ankle and foot  12/08/2018  . 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 Normocytic anemia 09/12/2014  . CKD (chronic kidney disease), stage III ([place]) 03/14/2014  . Glaucoma 03/08/2014  . Insomnia 03/08/2014  . Stress incontinence, female 10/12/2011  . Vaginal atrophy 05/27/2011  . Hyperglycemia 12/09/2009  . Obesity 10/11/2008  . GERD- failed h2 blocker 08/25/2007  . Hyperlipidemia 10/13/2006  . Major depression, recurrent, full remission ([place]) 10/13/2006  . Hypertension, essential 10/10/2006   Immunization History  Administered Date(s) Administered  . Influenza,inj,Quad PF,6+ Mos 11/15/2012, 10/30/2015, 10/12/2019  . Influenza-Unspecified 09/25/2013, 11/10/2014, 10/29/2016  . PFIZER(Purple Top)SARS-COV-2 Vaccination 02/15/2019, 03/08/2019, 11/09/2019  . Pneumococcal Conjugate-13 03/08/2014  . Pneumococcal Polysaccharide-23 03/13/2015  . Td 01/26/2003  . Tdap 10/16/2015  .  Zoster 12/12/2009    Conditions to be addressed/monitored: HTN, HLD, CKD Stage III and Obesity  Care Plan : [place]  Updates made by [name], [facility] since 04/14/2020 12:00 AM    Problem: HTN HLD CKD III Obesity   Priority: High    Goal: Disease Management   Start Date: 04/14/2020  Expected End Date: 04/14/2021  This Visit's Progress: On track  Priority: High  Note:   Hypertension (BP goal <130/80) -Controlled -Current treatment: . Amlodipine 5-320 mg once daily -Medications previously tried: Azor 5-40 mg once daily   -Denies hypotensive/hypertensive symptoms -Educated on BP goals and benefits of medications for prevention of heart attack, stroke and kidney damage; Daily salt intake goal < 2300 mg; Exercise goal of 150 minutes per week; -monitor BP at home at le, document, and provide log at future appointments -Counseled on diet and exercise extensively Recommended to continue current medication  Hyperlipidemia: (LDL goal < 100) -Not ideally controlled -Current treatment: . Rosuvastatin 5 mg twice  WEEKLY -Medications previously tried: pravastatin 20 mg daily (sore muscle), fenofibrate 150 mg, rosuvastatin 5 mg once weekly,   -Educated on Cholesterol goals;  Benefits of statin for ASCVD risk reduction; -Counseled on diet and exercise extensively. Had in depth discussion on rosuvastatin and metformin and risks/benefits to each along with risk factors for cardiovascular disease progression from preDM to DM.  Recommended increase to rosuvastatin 5 mg three times weekly - agreeable to starting this today - PCP updated.  Prediabetes (A1c goal < 5.7) -Not ideally controlled  -03/2020 FBG 100, a1c 6.1% -has screened at least annually  -HTN, HLD, BMI ~35 -Current medications: . N/a - previously declined metformin -Medications previously tried: n/a  -Current home glucose readings -Educated on A1c and blood sugar goals; Exercise goal of 150 minutes per week; Carbohydrate counting and/or plate method -Counseled on diet and exercise extensively Recommended pt to start on low dose metformin - declined at this time.  Current Barriers:  . Unable to independently afford treatment regimen . Suboptimal therapeutic regimen for HLD PreDM  Pharmacist Clinical Goal(s):  Marland Kitchen Over the next 365 days, patient will verbalize ability to afford treatment regimen . achieve adherence to monitoring guidelines and medication adherence to achieve therapeutic efficacy through collaboration with PharmD and provider.   Interventions: . 1:1 collaboration with Marin Olp, MD regarding development and update of comprehensive plan of care as evidenced by provider attestation and co-signature . Inter-disciplinary care team collaboration (see longitudinal plan of care) . Comprehensive medication review performed; medication list updated in electronic medical record  Patient Goals/Self-Care Activities . Over the next 365 days, patient will:  - take medications as prescribed target a minimum of 150 minutes of  moderate intensity exercise weekly engage in dietary modifications by following plate method discussed  Medication Assistance: None required.  Patient affirms current coverage meets needs.    Patient's preferred pharmacy is:  [id number] [id number]: [phone]: [phone] Follow Up:  Patient agrees to Care Plan and Follow-up. Plan: Telephone follow up appointment with care management team member scheduled for:  next month to review dietary/medication changes   Future Appointments  Date Time Provider DBayou Corne 05/19/2020  3:30 PM [facility] CCM PHARMACIST [facility] P[facility]  06/05/2020 10:00 AM [facility] MM 3 [facility]  10/09/2020  8:00 AM HMarin Olp MD [facility]  10/24/2020  8:45 AM [facility] HEALTH COACH [facility]   [name], [place].D., BCGP Clinical Pharmacist [place] [phone]

## 2020-05-19 ENCOUNTER — Ambulatory Visit: Payer: Medicare Other

## 2020-05-27 ENCOUNTER — Ambulatory Visit: Payer: Medicare Other

## 2020-06-05 ENCOUNTER — Other Ambulatory Visit: Payer: Self-pay

## 2020-06-05 ENCOUNTER — Ambulatory Visit
Admission: RE | Admit: 2020-06-05 | Discharge: 2020-06-05 | Disposition: A | Discharge status: Home / Self Care | Payer: Medicare Other | Source: Ambulatory Visit | Attending: Family Medicine | Admitting: Family Medicine

## 2020-06-05 DIAGNOSIS — Z1231 Encounter for screening mammogram for malignant neoplasm of breast: Secondary | ICD-10-CM

## 2020-06-09 ENCOUNTER — Telehealth: Payer: Self-pay

## 2020-06-09 NOTE — Chronic Care Management (AMB) (Signed)
    Chronic Care Management Pharmacy Assistant   Name: Ann Washington  MRN: [id number] DOB: 1950/08/13  Reason for Encounter: General Adherence Call  Recent office visits:  No visits noted  Recent consult visits:  No visits noted  Hospital visits:  None in previous 6 months  Medications: Outpatient Encounter Medications as of 06/09/2020  Medication Sig  . amLODipine-valsartan (EXFORGE) 5-320 MG tablet TAKE 1 TABLET BY MOUTH EVERY DAY  . APPLE CIDER VINEGAR PO Take 500 mg by mouth daily.   Marland Kitchen escitalopram (LEXAPRO) 5 MG tablet Take 1 tablet (5 mg total) by mouth daily.  . fluticasone (FLONASE) 50 MCG/ACT nasal spray SPRAY 2 SPRAYS INTO EACH NOSTRIL EVERY DAY  . latanoprost (XALATAN) 0.005 % ophthalmic solution 1 drop at bedtime.  . Multiple Vitamin (MULTIVITAMIN) tablet Take 1 tablet by mouth daily.  Marland Kitchen omeprazole (PRILOSEC) 20 MG capsule Take 20 mg by mouth daily.  . Red Yeast Rice Extract (RED YEAST RICE PO) Take 2 tablets by mouth at bedtime.  . rosuvastatin (CRESTOR) 5 MG tablet Take 1 tablet (5 mg total) by mouth 2 (two) times a week. (Patient taking differently: Take 5 mg by mouth 3 (three) times a week.)  . timolol (TIMOPTIC) 0.5 % ophthalmic solution Place 1 drop into both eyes 2 (two) times daily.  Marland Kitchen zolpidem (AMBIEN) 5 MG tablet TAKE 1 TABLET BY MOUTH EVERYDAY AT BEDTIME   No facility-administered encounter medications on file as of 06/09/2020.    Spoke with patient today and she was at work but she is doing well.  She will be having a phone visit with CPP 05/17 at 2pm, but needs a refill for her rosuvastatin as she is finished with her old fill. CPP notified of request.  Star Rating Drugs: Rosuvastatin 5 mg- 90 DS last filled 02/06/20  Wilford Sports CPA,CMA

## 2020-06-10 ENCOUNTER — Ambulatory Visit (INDEPENDENT_AMBULATORY_CARE_PROVIDER_SITE_OTHER): Payer: Medicare Other

## 2020-06-10 DIAGNOSIS — E785 Hyperlipidemia, unspecified: Secondary | ICD-10-CM

## 2020-06-10 DIAGNOSIS — I10 Essential (primary) hypertension: Secondary | ICD-10-CM | POA: Diagnosis not present

## 2020-06-10 MED ORDER — ROSUVASTATIN CALCIUM 5 MG PO TABS: 5.0000 mg | ORAL_TABLET | ORAL | 3 refills | Status: DC

## 2020-06-10 NOTE — Patient Instructions (Signed)
Ms. Mcever,  Thank you for talking with me today. I have included our care plan/goals in the following pages.   Please review and call me at [phone] with any questions.  Thanks! Ellin Mayhew, Pharm.D., BCGP Clinical Pharmacist [facility] [place] [phone] Patient Care Plan: [place] Plan    Problem Identified: HTN HLD CKD III Obesity   Priority: High    Goal: Disease Management   Start Date: 04/14/2020  Expected End Date: 04/14/2021  This Visit's Progress: On track  Recent Progress: On track  Priority: High  Note:   Current Barriers:   . Unable to independently afford treatment regimen . Suboptimal therapeutic regimen for HLD PreDM  Pharmacist Clinical Goal(s):  Marland Kitchen Over the next 365 days, patient will verbalize ability to afford treatment regimen . achieve adherence to monitoring guidelines and medication adherence to achieve therapeutic efficacy through collaboration with PharmD and provider.   Interventions: . 1:1 collaboration with Marin Olp, MD regarding development and update of comprehensive plan of care as evidenced by provider attestation and co-signature . Inter-disciplinary care team collaboration (see longitudinal plan of care) . Comprehensive medication review performed; medication list updated in electronic medical record . No Rx changes, tolerating rosuvastatin 5mg  TIW well  Hypertension (BP goal <130/80) -Controlled -Reports lower readings at home typically <120s/70s -Current treatment: . Amlodipine 5-320 mg once daily -Medications previously tried: Azor 5-40 mg once daily   -Denies hypotensive/hypertensive symptoms Daily salt intake goal < 2300 mg; Recommended to continue current medication  Hyperlipidemia: (LDL goal < 100 next 6 months) -Not ideally controlled -HTN, HLD, BMI ~35 -Current treatment: . Rosuvastatin 5 mg three times WEEKLY -Medications previously tried:  pravastatin 20 mg daily (sore muscle), fenofibrate 150 mg, rosuvastatin 5 mg once weekly,   -Educated on Cholesterol goals;  -Tolerating rosuvastatin three times weekly - no side effects noted, feels the same as previously. Recommended pt to continue medication - refill sent to pharmacy per patient request  Prediabetes (A1c goal < 5.7) -Not ideally controlled  -03/2020 FBG 100, a1c 6.1% -Current medications: . N/a - previously declined metformin -Exercise: has been active outdoors and going to the gym three times a week after work -Diet: has gotten used to unsweetened tea, being mindful of portion control. Some peanut butter crackers maybe a couple times per time, sometimes replacing meals Reviewed several questions related to healthy food choices with patient Pt prefers to continue to work on diet and exercise before considering metformin.  Patient Goals/Self-Care Activities . Over the next 365 days, patient will:  - take medications as prescribed target a minimum of 150 minutes of moderate intensity exercise weekly engage in dietary modifications by following plate method discussed  Medication Assistance: None required.  Patient affirms current coverage meets needs.    The patient verbalized understanding of instructions provided today and agreed to receive a MyChart copy of patient instruction and/or educational materials. Telephone follow up appointment with pharmacy team member scheduled for: See next appointment with "Care Management Staff" under "What's Next" below.

## 2020-06-10 NOTE — Progress Notes (Signed)
Chronic Care Management Pharmacy Note  06/10/2020 Name:  Ann Washington MRN:  [id number] DOB:  05-Mar-1950  Subjective: Ann Washington is an 70 y.o. year old female who is a primary patient of Hunter, Brayton Mars, MD.  The CCM team was consulted for assistance with disease management and care coordination needs.    Engaged with patient by telephone for follow up visit in response to provider referral for pharmacy case management and/or care coordination services.   Consent to Services:  The patient was given information about Chronic Care Management services, agreed to services, and gave verbal consent prior to initiation of services.  Please see initial visit note for detailed documentation.   Patient Care Team: Marin Olp, MD as PCP - General (Family Medicine) Paula Compton, MD as Consulting Physician (Obstetrics and Gynecology) Rolm Bookbinder, MD as Consulting Physician (Dermatology) Letta Pate Luanna Salk, MD as Consulting Physician (Physical Medicine and Rehabilitation) Marygrace Drought, MD as Consulting Physician (Ophthalmology) Garrel Ridgel, DPM as Consulting Physician (Podiatry) [name], [facility] as Pharmacist (Pharmacist)  Objective: Lab Results  Component Value Date   CREATININE 0.84 04/04/2020   CREATININE 0.78 10/12/2019   CREATININE 0.76 03/29/2019   GFR 69.99 04/04/2020   GFR 75.18 03/29/2019   GFRNONAA 77 10/12/2019   GFRNONAA 56.64 12/02/2009  Last diabetic Eye exam: No results found for: HMDIABEYEEXA  Last diabetic Foot exam: No results found for: HMDIABFOOTEX  Lab Results  Component Value Date   CHOL 212 (H) 10/12/2019   CHOL 261 (H) 09/21/2018   TRIG 114 10/12/2019   TRIG 146.0 09/21/2018   HDL 55 10/12/2019   HDL 52.40 09/21/2018   CHOLHDL 3.9 10/12/2019   CHOLHDL 5 09/21/2018   VLDL 29.2 09/21/2018   VLDL 25.4 03/23/2018   LDLCALC 135 (H) 10/12/2019   LDLCALC 179 (H) 09/21/2018   LDLDIRECT 132.0 04/04/2020   LDLDIRECT 151.0 03/29/2019    Hepatic Function Latest Ref Rng & Units 04/04/2020 10/12/2019 03/29/2019  Total Protein 6.0 - 8.3 g/dL 7.7 7.6 7.7  Albumin 3.5 - 5.2 g/dL 4.3 - 4.3  AST 0 - 37 U/L _0 ALT 0 - 35 U/L _1 Alk Phosphatase 39 - 117 U/L 57 - 54  Total Bilirubin 0.2 - 1.2 mg/dL 0.7 0.8 0.8  Bilirubin, Direct 0.0 - 0.3 mg/dL - - -   Lab Results  Component Value Date/Time   TSH 3.55 03/13/2015 09:08 AM   TSH 3.27 09/12/2014 10:42 AM   CBC Latest Ref Rng & Units 04/04/2020 10/12/2019 09/21/2018  WBC 4.0 - 10.5 K/uL 8.1 7.4 8.5  Hemoglobin 12.0 - 15.0 g/dL 13.6 12.8 13.7  Hematocrit 36.0 - 46.0 % 40.0 38.7 40.7  Platelets 150.0 - 400.0 K/uL 232.0 228 253.0   Lab Results  Component Value Date/Time   VD25OH 35 12/11/2010 09:44 AM    Clinical ASCVD: No  The 10-year ASCVD risk score Mikey Bussing DC Jr., et al., 2013) is: 16.3%   Values used to calculate the score:     Age: 32 years     Sex: Female     Is Non-Hispanic African American: No     Diabetic: No     Tobacco smoker: No     Systolic Blood Pressure: 595 mmHg     Is BP treated: Yes     HDL Cholesterol: 55 mg/dL     Total Cholesterol: 212 mg/dL    Social History   Tobacco Use  Smoking Status Never Smoker  Smokeless Tobacco Never Used   BP Readings from Last 3 Encounters:  04/04/20 138/84  10/12/19 128/84  03/29/19 130/80   Pulse Readings from Last 3 Encounters:  04/04/20 60  10/12/19 (!) 57  03/29/19 (!) 55   Wt Readings from Last 3 Encounters:  04/04/20 177 lb 12.8 oz (80.6 kg)  10/12/19 180 lb (81.6 kg)  03/29/19 182 lb 3.2 oz (82.6 kg)    Assessment: Review of patient past medical history, allergies, medications, health status, including review of consultants reports, laboratory and other test data, was performed as part of comprehensive evaluation and provision of chronic care management services.   SDOH:  (Social Determinants of Health) assessments and interventions performed: Yes.  CCM Care Plan Allergies  Allergen  Reactions  . Amlodipine Besy-Benazepril Hcl Other (See Comments)    Edema to ankle with redness/ but now she is tolerating the current regiment   Medications Reviewed Today    Reviewed by [name], [facility] (Pharmacist) on 06/10/20 at 1439  Med List Status: <None>  Medication Order Taking? Sig Documenting Provider Last Dose Status Informant  amLODipine-valsartan (EXFORGE) 5-320 MG tablet [id number] No TAKE 1 TABLET BY MOUTH EVERY DAY Marin Olp, MD Taking Active   APPLE CIDER VINEGAR PO [id number] No Take 500 mg by mouth daily.  [provider] Taking Active   escitalopram (LEXAPRO) 5 MG tablet [id number] No Take 1 tablet (5 mg total) by mouth daily. Marin Olp, MD Taking Active   fluticasone [facility]) 50 MCG/ACT nasal spray [id number] No SPRAY 2 SPRAYS INTO EACH NOSTRIL EVERY DAY Marin Olp, MD Taking Active   latanoprost (XALATAN) 0.005 % ophthalmic solution [id number] No 1 drop at bedtime. [provider] Taking Active   Multiple Vitamin (MULTIVITAMIN) tablet [id number] No Take 1 tablet by mouth daily. [provider] Taking Active   omeprazole (PRILOSEC) 20 MG capsule [id number] No Take 20 mg by mouth daily. [provider] Taking Active   Red Yeast Rice Extract (RED YEAST RICE PO) [id number] No Take 2 tablets by mouth at bedtime. [provider] Taking Active        Patient taking differently:      Discontinued 06/10/20 1439   rosuvastatin (CRESTOR) 5 MG tablet [id number]  Take 1 tablet (5 mg total) by mouth 3 (three) times a week. Marin Olp, MD  Active   timolol (TIMOPTIC) 0.5 % ophthalmic solution [id number] No Place 1 drop into both eyes 2 (two) times daily. [provider] Taking Active   zolpidem (AMBIEN) 5 MG tablet [id number]  TAKE 1 TABLET BY MOUTH EVERYDAY AT BEDTIME Marin Olp, MD  Active          Patient Active Problem List   Diagnosis Date Noted  . Osteoarthritis of ankle and foot 12/08/2018  .  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 Normocytic anemia 09/12/2014  . CKD (chronic kidney disease), stage III ([place]) 03/14/2014  . Glaucoma 03/08/2014  . Insomnia 03/08/2014  . Stress incontinence, female 10/12/2011  . Vaginal atrophy 05/27/2011  . Hyperglycemia 12/09/2009  . Obesity 10/11/2008  . GERD- failed h2 blocker 08/25/2007  . Hyperlipidemia 10/13/2006  . Major depression, recurrent, full remission ([place]) 10/13/2006  . Hypertension, essential 10/10/2006   Immunization History  Administered Date(s) Administered  . Influenza,inj,Quad PF,6+ Mos 11/15/2012, 10/30/2015, 10/12/2019  . Influenza-Unspecified 09/25/2013, 11/10/2014, 10/29/2016  . PFIZER(Purple Top)SARS-COV-2 Vaccination 02/15/2019, 03/08/2019, 11/09/2019  . Pneumococcal Conjugate-13 03/08/2014  . Pneumococcal Polysaccharide-23 03/13/2015  . Td 01/26/2003  . Tdap 10/16/2015  .  Zoster 12/12/2009   Conditions to be addressed/monitored: HTN, HLD, CKD Stage III and Obesity  Care Plan : [place]  Updates made by [name], [facility] since 06/10/2020 12:00 AM    Problem: HTN HLD CKD III Obesity   Priority: High    Goal: Disease Management   Start Date: 04/14/2020  Expected End Date: 04/14/2021  This Visit's Progress: On track  Recent Progress: On track  Priority: High  Note:   Current Barriers:   . Unable to independently afford treatment regimen . Suboptimal therapeutic regimen for HLD PreDM  Pharmacist Clinical Goal(s):  Marland Kitchen Over the next 365 days, patient will verbalize ability to afford treatment regimen . achieve adherence to monitoring guidelines and medication adherence to achieve therapeutic efficacy through collaboration with PharmD and provider.   Interventions: . 1:1 collaboration with Marin Olp, MD regarding development and update of comprehensive plan of care as evidenced by provider attestation and co-signature . Inter-disciplinary care team collaboration (see longitudinal plan of  care) . Comprehensive medication review performed; medication list updated in electronic medical record . No Rx changes, tolerating rosuvastatin 49m TIW well  Hypertension (BP goal <130/80) -Controlled -Reports lower readings at home typically <120s/70s -Current treatment: . Amlodipine 5-320 mg once daily -Medications previously tried: Azor 5-40 mg once daily   -Denies hypotensive/hypertensive symptoms Daily salt intake goal < 2300 mg; Recommended to continue current medication  Hyperlipidemia: (LDL goal < 100 next 6 months) -Not ideally controlled -HTN, HLD, BMI ~35 -Current treatment: . Rosuvastatin 5 mg three times WEEKLY -Medications previously tried: pravastatin 20 mg daily (sore muscle), fenofibrate 150 mg, rosuvastatin 5 mg once weekly,   -Educated on Cholesterol goals;  -Tolerating rosuvastatin three times weekly - no side effects noted, feels the same as previously. Recommended pt to continue medication - refill sent to pharmacy per patient request  Prediabetes (A1c goal < 5.7) -Not ideally controlled  -03/2020 FBG 100, a1c 6.1% -Current medications: . N/a - previously declined metformin -Exercise: has been active outdoors and going to the gym three times a week after work -Diet: has gotten used to unsweetened tea, being mindful of portion control. Some peanut butter crackers maybe a couple times per time, sometimes replacing meals Reviewed several questions related to healthy food choices with patient Pt prefers to continue to work on diet and exercise before considering metformin.  Patient Goals/Self-Care Activities . Over the next 365 days, patient will:  - take medications as prescribed target a minimum of 150 minutes of moderate intensity exercise weekly engage in dietary modifications by following plate method discussed  Medication Assistance: None required.  Patient affirms current coverage meets needs.    Follow Up:  Patient agrees to Care Plan and  Follow-up. Plan: Telephone follow up appointment with care management team member scheduled for 6 months   Patient's preferred pharmacy is:  [id number] [place], [place] [id number]: [phone]: [phone] Appointments  Date Time Provider [place]9/15/2022  8:00 AM HuMarin OlpMD [facility]  10/24/2020  8:45 AM [facility] HEALTH COACH [facility]  12/09/2020  3:30 PM [facility] CCM PHARMACIST [facility]   JaMadelin RearPharmD, CPP Clinical Pharmacist Practitioner  [facility]  [phone]

## 2020-07-03 IMAGING — MG DIGITAL SCREENING BILATERAL MAMMOGRAM WITH TOMO AND CAD
8 series · 9 of 24 positions shown · non-contrast
Comparison: Previous exam(s).

CLINICAL DATA: Screening.

EXAM:
DIGITAL SCREENING BILATERAL MAMMOGRAM WITH TOMO AND CAD

[L CC synth-2D]
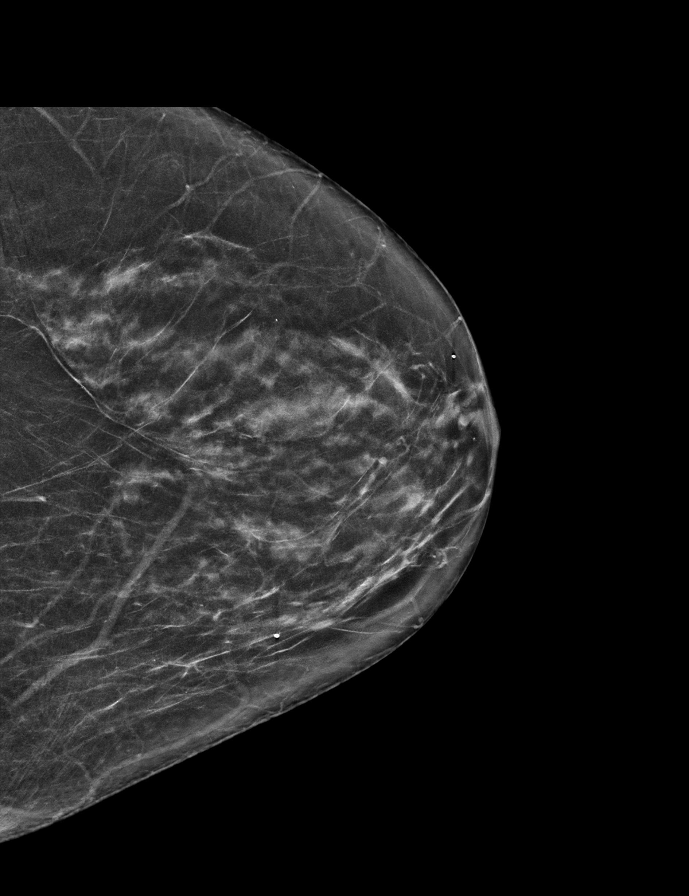

[L MLO synth-2D]
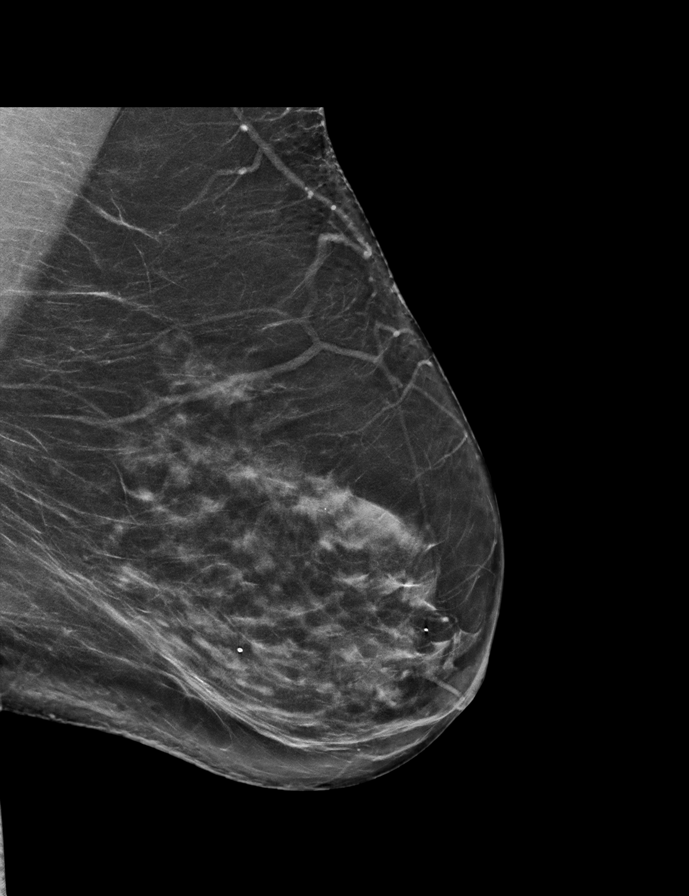

[R CC synth-2D]
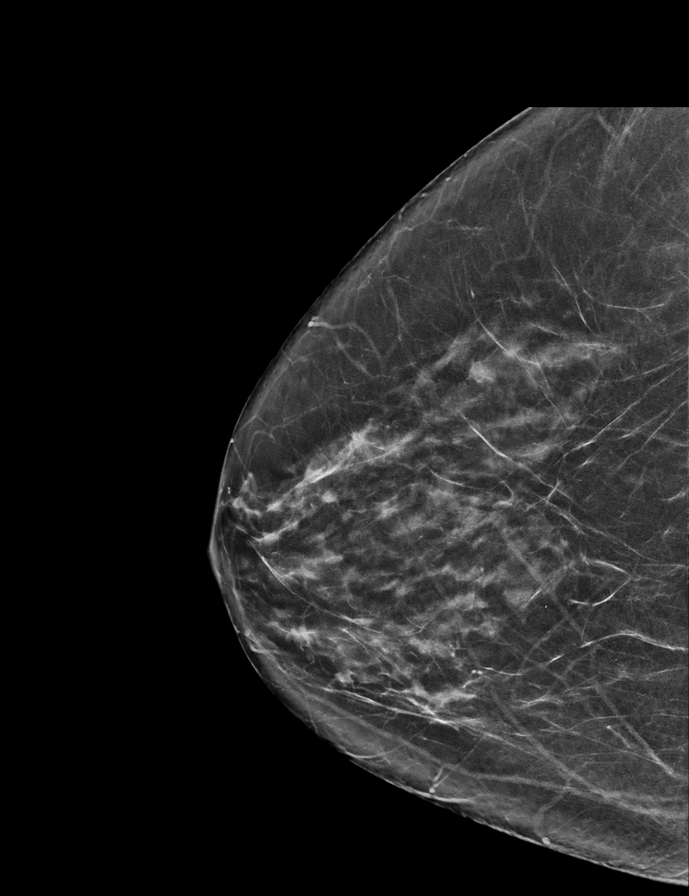

[R MLO synth-2D]
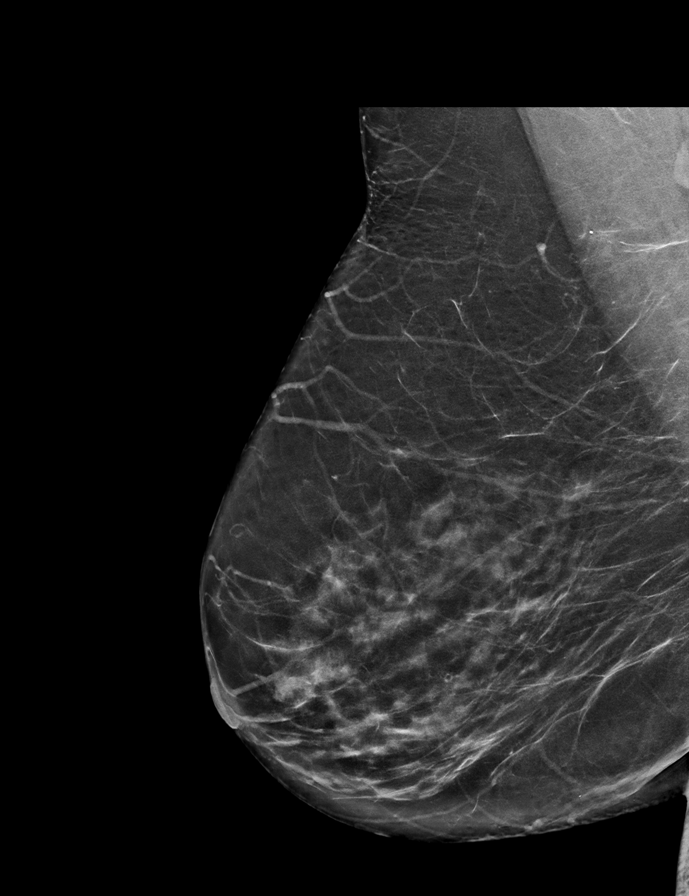

[R CC tomo · 2 of 63 frames shown]
[frame 21/63]
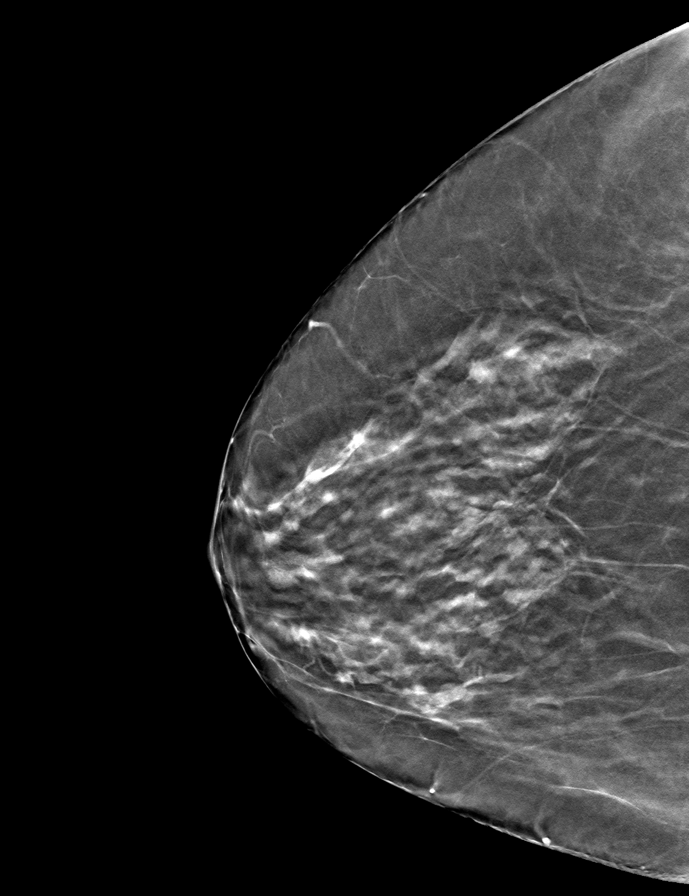
[frame 32/63]
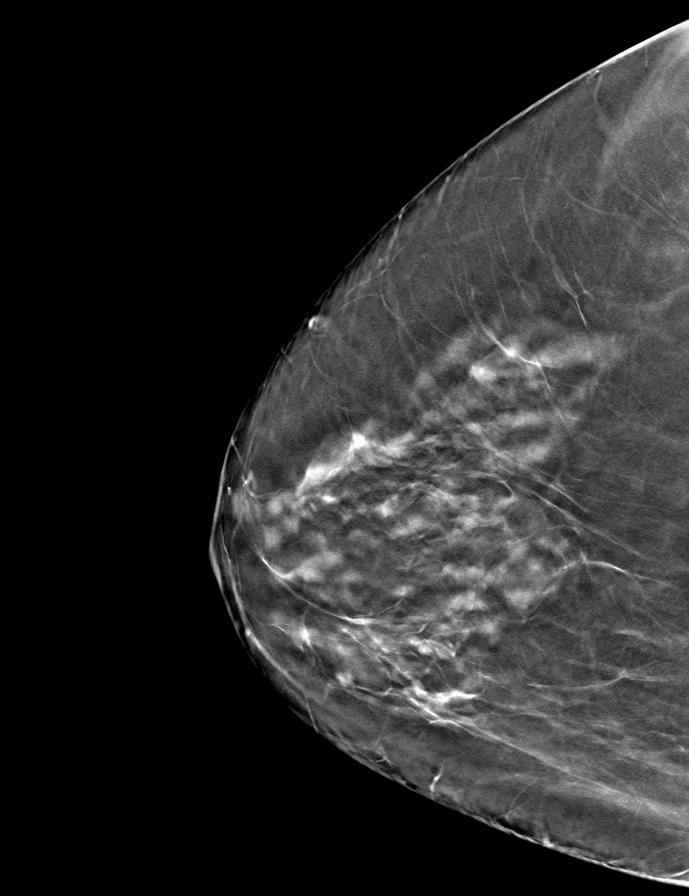

[L CC tomo · tomo slice 31/61.0]
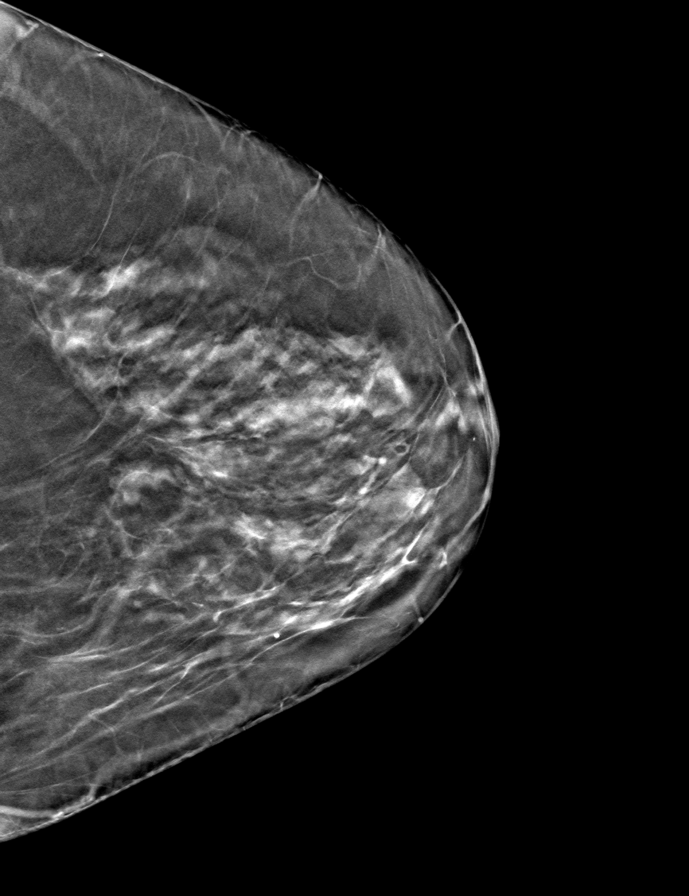

[L MLO tomo · tomo slice 37/72.0]
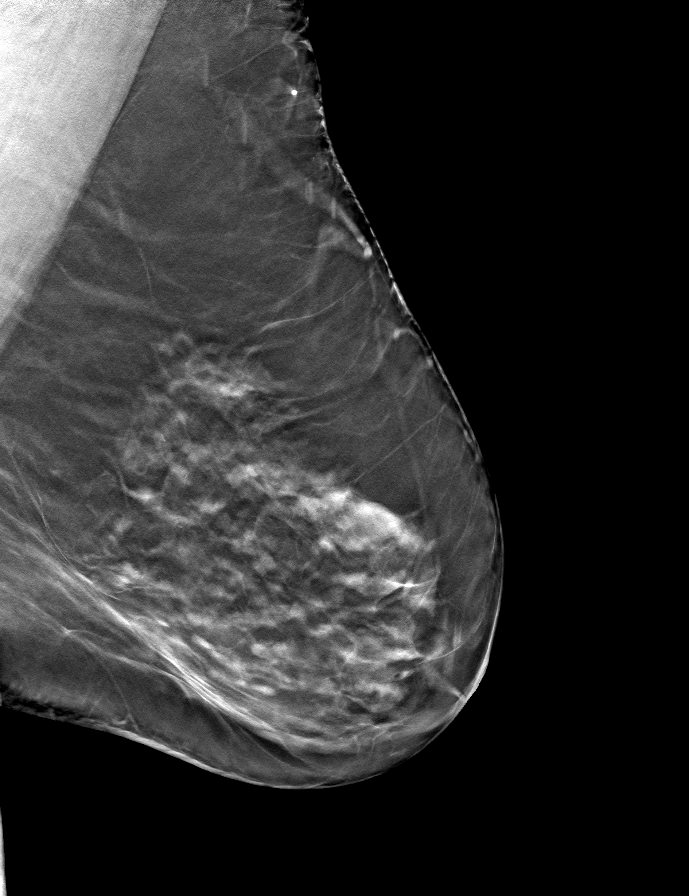

[R MLO tomo · tomo slice 36/71.0]
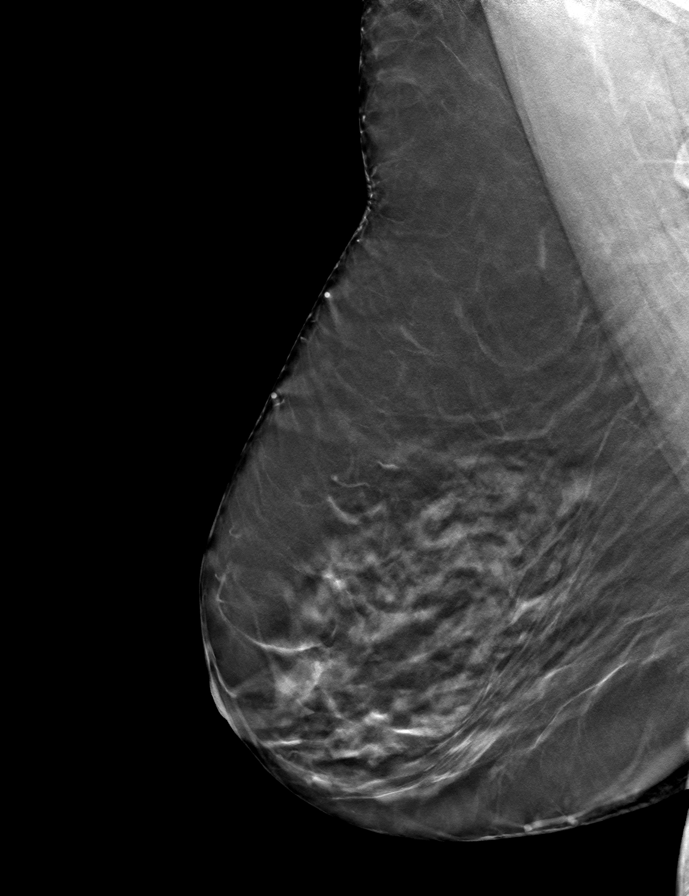

[9 of 24 positions shown; findings below may reference images not displayed]

ACR Breast Density Category c: The breast tissue is heterogeneously
dense, which may obscure small masses.
FINDINGS: There are no findings suspicious for malignancy. Images were
processed with CAD.
IMPRESSION: No mammographic evidence of malignancy. A result letter of this
screening mammogram will be mailed directly to the patient.

RECOMMENDATION:
Screening mammogram in one year. (Code:[id number])

BI-RADS CATEGORY  1: Negative.

## 2020-08-04 ENCOUNTER — Telehealth: Payer: Self-pay

## 2020-08-04 NOTE — Chronic Care Management (AMB) (Signed)
    Chronic Care Management Pharmacy Assistant   Name: Ann Washington  MRN: [id number] DOB: Mar 07, 1950  Reason for Encounter: Disease State - General Adherence Call   Recent office visits:  None  Recent consult visits:  None  Hospital visits:  None in previous 6 months  Medications: Outpatient Encounter Medications as of 08/04/2020  Medication Sig   amLODipine-valsartan (EXFORGE) 5-320 MG tablet TAKE 1 TABLET BY MOUTH EVERY DAY   APPLE CIDER VINEGAR PO Take 500 mg by mouth daily.    escitalopram (LEXAPRO) 5 MG tablet Take 1 tablet (5 mg total) by mouth daily.   fluticasone (FLONASE) 50 MCG/ACT nasal spray SPRAY 2 SPRAYS INTO EACH NOSTRIL EVERY DAY   latanoprost (XALATAN) 0.005 % ophthalmic solution 1 drop at bedtime.   Multiple Vitamin (MULTIVITAMIN) tablet Take 1 tablet by mouth daily.   omeprazole (PRILOSEC) 20 MG capsule Take 20 mg by mouth daily.   Red Yeast Rice Extract (RED YEAST RICE PO) Take 2 tablets by mouth at bedtime.   rosuvastatin (CRESTOR) 5 MG tablet Take 1 tablet (5 mg total) by mouth 3 (three) times a week.   timolol (TIMOPTIC) 0.5 % ophthalmic solution Place 1 drop into both eyes 2 (two) times daily.   zolpidem (AMBIEN) 5 MG tablet TAKE 1 TABLET BY MOUTH EVERYDAY AT BEDTIME   No facility-administered encounter medications on file as of 08/04/2020.   Reviewed chart for medication changes ahead of general adherence call.  No OVs, Consults, or hospital visits since last care coordination call/Pharmacist visit.  No medication changes indicated.  Patient Questions: Have you had any problems recently with your health? Patient states she had not had any problems recently with her health.  Have you had any problems with your pharmacy? Patient states she has not had any problems with her pharmacy.  What issues or side effects are you having with your medications? Patient states she is not currently having any issues or side effects from any of her  medications.  What would you like me to pass along to [name], CPP for him to help you with?  Patient states she does not have anything for me to pass along at this time.  What can we do to take care of you better? Patient states "I am doing well at the moment and don't need anything, thank you for your call."  April D Calhoun, [place] Pharmacist Assistant [phone]    Future Appointments  Date Time Provider [place]  10/09/2020  8:00 AM Marin Olp, MD [facility]  10/24/2020  8:45 AM [facility] HEALTH COACH [facility]  12/09/2020  3:30 PM [facility] CCM PHARMACIST [facility]     Star Rating Drugs: Rosuvastatin 5 mg last filled 06/10/2020 91 DS  April D Calhoun, [place] Pharmacist Assistant [phone]

## 2020-09-24 ENCOUNTER — Other Ambulatory Visit: Payer: Self-pay | Admitting: Family Medicine

## 2020-10-07 NOTE — Progress Notes (Signed)
Phone [phone] In person visit   Subjective:   Ann Washington is a 70 y.o. year old very pleasant female patient who presents for/with See problem oriented charting Chief Complaint  Patient presents with   Hypertension   Hyperlipidemia   Hyperglycemia    This visit occurred during the SARS-CoV-2 public health emergency.  Safety protocols were in place, including screening questions prior to the visit, additional usage of staff PPE, and extensive cleaning of exam room while observing appropriate contact time as indicated for disinfecting solutions.   Past Medical History-  Patient Active Problem List   Diagnosis Date Noted   CKD (chronic kidney disease), stage III ([place]) 03/14/2014    Priority: Medium   Glaucoma 03/08/2014    Priority: Medium   Insomnia 03/08/2014    Priority: Medium   Hyperglycemia 12/09/2009    Priority: Medium   Hyperlipidemia 10/13/2006    Priority: Medium   Major depression, recurrent, full remission ([place]) 10/13/2006    Priority: Medium   Hypertension, essential 10/10/2006    Priority: Medium   Normocytic anemia 09/12/2014    Priority: Low   Stress incontinence, female 10/12/2011    Priority: Low   Vaginal atrophy 05/27/2011    Priority: Low   Obesity 10/11/2008    Priority: Low   GERD- failed h2 blocker 08/25/2007    Priority: Low   Osteoarthritis of ankle and foot 12/08/2018   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    Medications- reviewed and updated Current Outpatient Medications  Medication Sig Dispense Refill   amLODipine-valsartan (EXFORGE) 5-320 MG tablet TAKE 1 TABLET BY MOUTH EVERY DAY 90 tablet 3   escitalopram (LEXAPRO) 5 MG tablet Take 1 tablet (5 mg total) by mouth daily. 90 tablet 3   fluticasone (FLONASE) 50 MCG/ACT nasal spray SPRAY 2 SPRAYS INTO EACH NOSTRIL EVERY DAY 48 mL 1   Multiple Vitamin (MULTIVITAMIN) tablet Take 1 tablet by mouth daily.     omeprazole (PRILOSEC) 20 MG capsule Take 20 mg by mouth daily.     rosuvastatin  (CRESTOR) 5 MG tablet Take 1 tablet (5 mg total) by mouth 3 (three) times a week. 39 tablet 3   timolol (TIMOPTIC) 0.5 % ophthalmic solution Place 1 drop into both eyes 2 (two) times daily.  0   zolpidem (AMBIEN) 5 MG tablet TAKE 1 TABLET BY MOUTH EVERYDAY AT BEDTIME 30 tablet 5   No current facility-administered medications for this visit.     Objective:  BP 132/82   Pulse (!) 54   Temp 97.9 F (36.6 C) (Temporal)   Ht 5' (1.524 m)   Wt 175 lb 9.6 oz (79.7 kg)   SpO2 99%   BMI 34.29 kg/m  Gen: NAD, resting comfortably CV: RRR no murmurs rubs or gallops Lungs: CTAB no crackles, wheeze, rhonchi Abdomen: soft/nontender/nondistended/normal bowel sounds. No rebound or guarding.  Ext: stable edema Skin: warm, dry     Assessment and Plan   #foot pain- needs surgery with Dr. Doran Durand - may get injection soon- holding off while still working  # SInus concerns S: Patient with history of allergic rhinitis.  She is currently taking Flonase 2 sprays each nostril daily but even with this is having some issues particularly in the summers. Claritin did not help years ago- was not on flonase at the time.  A/P: Allergies are poorly controlled.  We discussed trying Xyzal before bed while continuing Flonase 2 sprays each nostril daily.  This will take at least 3 to 4 weeks to know  how effective it is and I wanted her to let me know if not improving at that time   # new MV concerns S: Patient started new multivitamin called Mason over 20- thinks womens specific. Prior [employer] brand centrum silver. Having some restlessness at night /trouble since switching.  A/P: Since some of her issues are nighttime restlessness-we are going to see if starting Xyzal at night may resolve these issues.  If it does not within a week or 2 I want her to convert back to the old [employer] brand Centrum Silver and let me know if symptoms or not improving with that.  #Hypertension/CKD stage III S: Compliant with  amlodipine-valsartan 5-320 mg. Knows to avoid NSAIDs-Celebrex prescribed by Dr. Milinda Pointer in past is no longer on. Valsartan is protective in case there is a proteinuric element. Home reading #s:128/68 most recent home reading BP Readings from Last 3 Encounters:  10/09/20 132/82  04/04/20 138/84  10/12/19 128/84  A/P: Blood pressure Controlled. Continue current medications. Chronic kidney disease stage III-we will monitor with CMP today  #Hyperlipidemia S: Poorly controlled on red yeast rice alone in past- later on rosuvastatin 5 mg twice a week starting 10/18/19- upped to 3x a week at last visit. Myalgias on pravastatin in the past. Lab Results  Component Value Date   CHOL 212 (H) 10/12/2019   HDL 55 10/12/2019   LDLCALC 135 (H) 10/12/2019   LDLDIRECT 132.0 04/04/2020   TRIG 114 10/12/2019   CHOLHDL 3.9 10/12/2019  A/P: hopefully improved-  update direct LDL today   #Hyperglycemia S: Increased risk of diabetes. No current medication. Lifestyle modification only. Lab Results  Component Value Date   HGBA1C 6.1 04/04/2020   HGBA1C 6.0 (H) 10/12/2019   HGBA1C 6.1 03/29/2019  A/P: Hopefully stable-update A1c with labs today  #Depression in full remission S: Compliant with Lexapro 5 mg. PHQ 9 of 0 today  Depression screen [facility] 2/9 10/09/2020 04/04/2020 10/22/2019  Decreased Interest 0 0 0  Down, Depressed, Hopeless 0 0 0  PHQ - 2 Score 0 0 0  Altered sleeping 0 0 -  Tired, decreased energy 0 0 -  Change in appetite 0 0 -  Feeling bad or failure about yourself  0 0 -  Trouble concentrating 0 0 -  Moving slowly or fidgety/restless 0 0 -  Suicidal thoughts 0 0 -  PHQ-9 Score 0 0 -  Difficult doing work/chores Not difficult at all Not difficult at all -  Some recent data might be hidden  A/P: full remission of depression-  continue current meds. She wants to trial off medicine completely- advised her to take half tablet for 2 weeks when she is down to 7 pills. If she has worsening  symptoms she will let us know.   Recommended follow up: Return in about 6 months (around 04/08/2021) for physical or sooner if needed. Future Appointments  Date Time Provider [place]  10/24/2020  8:45 AM [facility] HEALTH COACH [facility]  12/09/2020  3:30 PM [facility] CCM PHARMACIST [facility]    Lab/Order associations:   ICD-10-CM   1. Hypertension, essential  I10     2. Hyperlipidemia, unspecified hyperlipidemia type  E78.5     3. Hyperglycemia  R73.9     4. Stage 3 chronic kidney disease, unspecified whether stage 3a or 3b CKD ([place])  N18.30       Return precautions advised.  Garret Reddish, MD

## 2020-10-09 ENCOUNTER — Telehealth: Payer: Self-pay | Admitting: Pharmacist

## 2020-10-09 ENCOUNTER — Encounter: Payer: Self-pay | Admitting: Family Medicine

## 2020-10-09 ENCOUNTER — Other Ambulatory Visit: Payer: Self-pay

## 2020-10-09 ENCOUNTER — Ambulatory Visit: Payer: Medicare Other | Admitting: Family Medicine

## 2020-10-09 ENCOUNTER — Other Ambulatory Visit: Payer: Self-pay | Admitting: Family Medicine

## 2020-10-09 VITALS — BP 132/82 | HR 54 | Temp 97.9°F | Ht 60.0 in | Wt 175.6 lb

## 2020-10-09 DIAGNOSIS — N183 Chronic kidney disease, stage 3 unspecified: Secondary | ICD-10-CM | POA: Diagnosis not present

## 2020-10-09 DIAGNOSIS — E785 Hyperlipidemia, unspecified: Secondary | ICD-10-CM | POA: Diagnosis not present

## 2020-10-09 DIAGNOSIS — I10 Essential (primary) hypertension: Secondary | ICD-10-CM

## 2020-10-09 DIAGNOSIS — R739 Hyperglycemia, unspecified: Secondary | ICD-10-CM | POA: Diagnosis not present

## 2020-10-09 LAB — LIPID PANEL
Cholesterol: 192 mg/dL (ref 0–200)
HDL: 56 mg/dL (ref >39.00)
LDL Cholesterol: 121 mg/dL — ABNORMAL HIGH (ref 0–99)
NonHDL: 135.78
Total CHOL/HDL Ratio: 3
Triglycerides: 76 mg/dL (ref 0.0–149.0)
VLDL: 15.2 mg/dL (ref 0.0–40.0)

## 2020-10-09 LAB — COMPREHENSIVE METABOLIC PANEL
ALT: 16 U/L (ref 0–35)
AST: 19 U/L (ref 0–37)
Albumin: 4.3 g/dL (ref 3.5–5.2)
Alkaline Phosphatase: 48 U/L (ref 39–117)
BUN: 20 mg/dL (ref 6–23)
CO2: 27 mEq/L (ref 19–32)
Calcium: 9.6 mg/dL (ref 8.4–10.5)
Chloride: 105 mEq/L (ref 96–112)
Creatinine, Ser: 0.78 mg/dL (ref 0.40–1.20)
GFR: 76.23 mL/min (ref >60.00)
Glucose, Bld: 103 mg/dL — ABNORMAL HIGH (ref 70–99)
Potassium: 4.2 mEq/L (ref 3.5–5.1)
Sodium: 138 mEq/L (ref 135–145)
Total Bilirubin: 0.7 mg/dL (ref 0.2–1.2)
Total Protein: 7.6 g/dL (ref 6.0–8.3)

## 2020-10-09 LAB — HEMOGLOBIN A1C: Hgb A1c MFr Bld: 6.2 % (ref 4.6–6.5)

## 2020-10-09 NOTE — Chronic Care Management (AMB) (Addendum)
    Chronic Care Management Pharmacy Assistant   Name: Ann Washington  MRN: [id number] DOB: 11/30/1950   Reason for Encounter: General Adherence Call    Recent office visits:  10/09/2020 [facility] (PCP) Marin Olp, MD; chronic follow up, discussed taking Xyzal before bed for sinus concerns  Recent consult visits:  None  Hospital visits:  None  Medications: Outpatient Encounter Medications as of 10/09/2020  Medication Sig   amLODipine-valsartan (EXFORGE) 5-320 MG tablet TAKE 1 TABLET BY MOUTH EVERY DAY   escitalopram (LEXAPRO) 5 MG tablet Take 1 tablet (5 mg total) by mouth daily.   fluticasone (FLONASE) 50 MCG/ACT nasal spray SPRAY 2 SPRAYS INTO EACH NOSTRIL EVERY DAY   Multiple Vitamin (MULTIVITAMIN) tablet Take 1 tablet by mouth daily.   omeprazole (PRILOSEC) 20 MG capsule Take 20 mg by mouth daily.   rosuvastatin (CRESTOR) 5 MG tablet Take 1 tablet (5 mg total) by mouth 3 (three) times a week.   timolol (TIMOPTIC) 0.5 % ophthalmic solution Place 1 drop into both eyes 2 (two) times daily.   zolpidem (AMBIEN) 5 MG tablet TAKE 1 TABLET BY MOUTH EVERYDAY AT BEDTIME   No facility-administered encounter medications on file as of 10/09/2020.   Patient Questions: Have you had any problems recently with your health? Patient states she has not had any problems recently with her health.  Have you had any problems with your pharmacy? Patient states she has not had any problems recently with her pharmacy.  What issues or side effects are you having with your medications? Patient states she believes her previous multi vitamin was causing her to have sleeplessness at night so she stopped taking it. Patient states she switched to a different brand and hope that helps.  What would you like me to pass along to Leata Mouse, CPP for him to help you with?  Patient states she does not have anything to pass along at this time.  What can we do to take care of you better? Patient did not have  any suggestions.   Care Gaps: Medicare Annual Wellness: scheduled AWV 10/24/2020  PNA Vaccination: Completed Hemoglobin A1C: 6.1% on 04/04/2020 Colonoscopy: Next due on 08/10/2022 Dexa Scan: Completed Mammogram: Next due on 06/06/2022 HPV Vaccines: Aged Out  Future Appointments  Date Time Provider [place]  10/24/2020  8:45 AM [facility] HEALTH COACH [facility]  12/09/2020  3:30 PM [facility] CCM PHARMACIST [facility]  04/23/2021  9:40 AM Marin Olp, MD [facility]     Star Rating Drugs: Rosuvastatin 5 mg last filled 09/05/2020 91 DS Amlodipine-Valsartan last filled 07/30/2020 90 DS  April D Calhoun, [place] Pharmacist Assistant [phone]

## 2020-10-09 NOTE — Patient Instructions (Addendum)
Health Maintenance Due  Topic Date Due   Zoster Vaccines- Shingrix (1 of 2) Please check with your pharmacy to see if they have the shingrix vaccine. If they do- please get this immunization and update [place] by phone call or mychart with dates you receive the vaccine. This will cheaper at your local pharmacy.  Never done   COVID-19 Vaccine (4 - Booster for [employer] series)   Recommend omicron booster at pharmacy- let us know when you get this and flu shot 02/01/2020   INFLUENZA VACCINE Patient states that she will wait until October and get this done at her local pharmacy.  08/25/2020   Allergies are poorly controlled.  We discussed trying Xyzal before bed while continuing Flonase 2 sprays each nostril daily.  This will take at least 3 to 4 weeks to know how effective it is and I wanted her to let me know if not improving at that time  Since some of her issues are nighttime restlessness-we are going to see if starting Xyzal at night may resolve these issues.  If it does not within a week or 2 I want her to convert back to the old [employer] brand Centrum Silver and let me know if symptoms or not improving with that.  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 She wants to trial off medicine completely- advised her to take half tablet for 2 weeks when she is down to 7 pills. If she has worsening symptoms she will let us know.   Team please connect patient with Jeanett Schlein or [facility]. Last visit we did a physical and was charged copay- I had asked for this to be refunded but she thought it would be through insurance not front desk. She also was charged 30 today- I would assume should get at least $30 back but check please.   Recommended follow up: Return in about 6 months (around 04/08/2021) for physical or sooner if needed.

## 2020-10-10 ENCOUNTER — Telehealth: Payer: Self-pay

## 2020-10-10 NOTE — Telephone Encounter (Signed)
Pt stated that she was returning a call.

## 2020-10-13 NOTE — Telephone Encounter (Signed)
Returned pt's call. Please see lab note.

## 2020-10-24 ENCOUNTER — Ambulatory Visit (INDEPENDENT_AMBULATORY_CARE_PROVIDER_SITE_OTHER): Payer: Medicare Other

## 2020-10-24 ENCOUNTER — Other Ambulatory Visit: Payer: Self-pay

## 2020-10-24 DIAGNOSIS — Z0000 Encounter for general adult medical examination without abnormal findings: Secondary | ICD-10-CM | POA: Diagnosis not present

## 2020-10-24 NOTE — Patient Instructions (Signed)
[name] , Thank you for taking time to come for your Medicare Wellness Visit. I appreciate your ongoing commitment to your health goals. Please review the following plan we discussed and let me know if I can assist you in the future.   Screening recommendations/referrals: Colonoscopy: Done 08/09/12 repeat every 10 years due 08/10/22 Mammogram: Done 06/05/20 repeat every year  Bone Density: Done 05/31/19 repeat every 2 years  Recommended yearly ophthalmology/optometry visit for glaucoma screening and checkup Recommended yearly dental visit for hygiene and checkup  Vaccinations: Influenza vaccine: Due  Pneumococcal vaccine: Completed  Tdap vaccine: done 10/16/15 repeat every 10 years due 10/15/25 Shingles vaccine: Shingrix discussed. Please contact your pharmacy for coverage information.    Covid-19:Completed 1/21, 2/11, & 11/09/19  Advanced directives: Please bring a copy of your health care power of attorney and living will to the office at your convenience.  Conditions/risks identified: Continue exercise   Next appointment: Follow up in one year for your annual wellness visit    Preventive Care 65 Years and Older, Female Preventive care refers to lifestyle choices and visits with your health care provider that can promote health and wellness. What does preventive care include? A yearly physical exam. This is also called an annual well check. Dental exams once or twice a year. Routine eye exams. Ask your health care provider how often you should have your eyes checked. Personal lifestyle choices, including: Daily care of your teeth and gums. Regular physical activity. Eating a healthy diet. Avoiding tobacco and drug use. Limiting alcohol use. Practicing safe sex. Taking low-dose aspirin every day. Taking vitamin and mineral supplements as recommended by your health care provider. What happens during an annual well check? The services and screenings done by your health care  provider during your annual well check will depend on your age, overall health, lifestyle risk factors, and family history of disease. Counseling  Your health care provider may ask you questions about your: Alcohol use. Tobacco use. Drug use. Emotional well-being. Home and relationship well-being. Sexual activity. Eating habits. History of falls. Memory and ability to understand (cognition). Work and work Statistician. Reproductive health. Screening  You may have the following tests or measurements: Height, weight, and BMI. Blood pressure. Lipid and cholesterol levels. These may be checked every 5 years, or more frequently if you are over 54 years old. Skin check. Lung cancer screening. You may have this screening every year starting at age 15 if you have a 30-pack-year history of smoking and currently smoke or have quit within the past 15 years. Fecal occult blood test (FOBT) of the stool. You may have this test every year starting at age 54. Flexible sigmoidoscopy or colonoscopy. You may have a sigmoidoscopy every 5 years or a colonoscopy every 10 years starting at age 75. Hepatitis C blood test. Hepatitis B blood test. Sexually transmitted disease (STD) testing. Diabetes screening. This is done by checking your blood sugar (glucose) after you have not eaten for a while (fasting). You may have this done every 1-3 years. Bone density scan. This is done to screen for osteoporosis. You may have this done starting at age 66. Mammogram. This may be done every 1-2 years. Talk to your health care provider about how often you should have regular mammograms. Talk with your health care provider about your test results, treatment options, and if necessary, the need for more tests. Vaccines  Your health care provider may recommend certain vaccines, such as: Influenza vaccine. This is recommended every year.  Tetanus, diphtheria, and acellular pertussis (Tdap, Td) vaccine. You may need a Td  booster every 10 years. Zoster vaccine. You may need this after age 25. Pneumococcal 13-valent conjugate (PCV13) vaccine. One dose is recommended after age 33. Pneumococcal polysaccharide (PPSV23) vaccine. One dose is recommended after age 21. Talk to your health care provider about which screenings and vaccines you need and how often you need them. This information is not intended to replace advice given to you by your health care provider. Make sure you discuss any questions you have with your health care provider. Document Released: 02/07/2015 Document Revised: 10/01/2015 Document Reviewed: 11/12/2014 Elsevier Interactive Patient Education  2017 [place] Prevention in the Home Falls can cause injuries. They can happen to people of all ages. There are many things you can do to make your home safe and to help prevent falls. What can I do on the outside of my home? Regularly fix the edges of walkways and driveways and fix any cracks. Remove anything that might make you trip as you walk through a door, such as a raised step or threshold. Trim any bushes or trees on the path to your home. Use bright outdoor lighting. Clear any walking paths of anything that might make someone trip, such as rocks or tools. Regularly check to see if handrails are loose or broken. Make sure that both sides of any steps have handrails. Any raised decks and porches should have guardrails on the edges. Have any leaves, snow, or ice cleared regularly. Use sand or salt on walking paths during winter. Clean up any spills in your garage right away. This includes oil or grease spills. What can I do in the bathroom? Use night lights. Install grab bars by the toilet and in the tub and shower. Do not use towel bars as grab bars. Use non-skid mats or decals in the tub or shower. If you need to sit down in the shower, use a plastic, non-slip stool. Keep the floor dry. Clean up any water that spills on the floor  as soon as it happens. Remove soap buildup in the tub or shower regularly. Attach bath mats securely with double-sided non-slip rug tape. Do not have throw rugs and other things on the floor that can make you trip. What can I do in the bedroom? Use night lights. Make sure that you have a light by your bed that is easy to reach. Do not use any sheets or blankets that are too big for your bed. They should not hang down onto the floor. Have a firm chair that has side arms. You can use this for support while you get dressed. Do not have throw rugs and other things on the floor that can make you trip. What can I do in the kitchen? Clean up any spills right away. Avoid walking on wet floors. Keep items that you use a lot in easy-to-reach places. If you need to reach something above you, use a strong step stool that has a grab bar. Keep electrical cords out of the way. Do not use floor polish or wax that makes floors slippery. If you must use wax, use non-skid floor wax. Do not have throw rugs and other things on the floor that can make you trip. What can I do with my stairs? Do not leave any items on the stairs. Make sure that there are handrails on both sides of the stairs and use them. Fix handrails that are broken or loose.  Make sure that handrails are as long as the stairways. Check any carpeting to make sure that it is firmly attached to the stairs. Fix any carpet that is loose or worn. Avoid having throw rugs at the top or bottom of the stairs. If you do have throw rugs, attach them to the floor with carpet tape. Make sure that you have a light switch at the top of the stairs and the bottom of the stairs. If you do not have them, ask someone to add them for you. What else can I do to help prevent falls? Wear shoes that: Do not have high heels. Have rubber bottoms. Are comfortable and fit you well. Are closed at the toe. Do not wear sandals. If you use a stepladder: Make sure that it is  fully opened. Do not climb a closed stepladder. Make sure that both sides of the stepladder are locked into place. Ask someone to hold it for you, if possible. Clearly mark and make sure that you can see: Any grab bars or handrails. First and last steps. Where the edge of each step is. Use tools that help you move around (mobility aids) if they are needed. These include: Canes. Walkers. Scooters. Crutches. Turn on the lights when you go into a dark area. Replace any light bulbs as soon as they burn out. Set up your furniture so you have a clear path. Avoid moving your furniture around. If any of your floors are uneven, fix them. If there are any pets around you, be aware of where they are. Review your medicines with your doctor. Some medicines can make you feel dizzy. This can increase your chance of falling. Ask your doctor what other things that you can do to help prevent falls. This information is not intended to replace advice given to you by your health care provider. Make sure you discuss any questions you have with your health care provider. Document Released: 11/07/2008 Document Revised: 06/19/2015 Document Reviewed: 02/15/2014 Elsevier Interactive Patient Education  2017 [employer].

## 2020-10-24 NOTE — Progress Notes (Addendum)
Virtual Visit via Telephone Note  I connected with  Ann Washington on 10/24/20 at  8:45 AM EDT by telephone and verified that I am speaking with the correct person using two identifiers.  Medicare Annual Wellness visit completed telephonically due to Covid-19 pandemic.   Persons participating in this call: This Health Coach and this patient.   Location: Patient: Home  Provider: Office   I discussed the limitations, risks, security and privacy concerns of performing an evaluation and management service by telephone and the availability of in person appointments. The patient expressed understanding and agreed to proceed.  Unable to perform video visit due to video visit attempted and failed and/or patient does not have video capability.   Some vital signs may be absent or patient reported.   Willette Brace, LPN   Subjective:   Ann Washington is a 70 y.o. female who presents for Medicare Annual (Subsequent) preventive examination.  Review of Systems     Cardiac Risk Factors include: advanced age (>16men, >91 women);hypertension;dyslipidemia;obesity (BMI >30kg/m2)     Objective:    There were no vitals filed for this visit. There is no height or weight on file to calculate BMI.  Advanced Directives 10/24/2020 10/22/2019 10/05/2018 09/14/2017 04/01/2016  Does Patient Have a Medical Advance Directive? Yes Yes No No No  Type of Advance Directive - Healthcare Power of [place];Living will - - -  Does patient want to make changes to medical advance directive? Yes (MAU/Ambulatory/Procedural Areas - Information given) - - - -  Copy of Healthcare Power of Attorney in Chart? - No - copy requested - - -  Would patient like information on creating a medical advance directive? - - Yes (MAU/Ambulatory/Procedural Areas - Information given) No - Patient declined -    Current Medications (verified) Outpatient Encounter Medications as of 10/24/2020  Medication Sig   amLODipine-valsartan  (EXFORGE) 5-320 MG tablet TAKE 1 TABLET BY MOUTH EVERY DAY   escitalopram (LEXAPRO) 5 MG tablet Take 1 tablet (5 mg total) by mouth daily.   fluticasone (FLONASE) 50 MCG/ACT nasal spray SPRAY 2 SPRAYS INTO EACH NOSTRIL EVERY DAY   latanoprost (XALATAN) 0.005 % ophthalmic solution 1 drop at bedtime.   Multiple Vitamin (MULTIVITAMIN) tablet Take 1 tablet by mouth daily.   omeprazole (PRILOSEC) 20 MG capsule Take 20 mg by mouth daily.   rosuvastatin (CRESTOR) 5 MG tablet Take 1 tablet (5 mg total) by mouth 3 (three) times a week.   timolol (TIMOPTIC) 0.5 % ophthalmic solution Place 1 drop into both eyes 2 (two) times daily.   zolpidem (AMBIEN) 5 MG tablet TAKE 1 TABLET BY MOUTH EVERYDAY AT BEDTIME   No facility-administered encounter medications on file as of 10/24/2020.    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   Required high dose PPI, now on prilosec     GERD (gastroesophageal reflux disease)    Glaucoma    Hyperlipidemia    Hypertension    Snoring 03/08/2008   Tested 2007 per patient-unrevealing for sleep apnea     Past Surgical History:  Procedure Laterality Date   CATARACT EXTRACTION     late 2019   COLONOSCOPY     COLPOSCOPY     age 20   GYNECOLOGIC CRYOSURGERY     age 34   HAMMER TOE SURGERY  TUBAL LIGATION     Family History  Problem Relation Age of Onset   Diabetes Sister    Hypertension Sister    Alzheimer's disease Mother    Hip fracture Mother    Osteoporosis Mother    Deep vein thrombosis Father    Heart disease Father        MI 3s, nonsmoker   Hypertension Brother    Diabetes Maternal Grandmother    Stroke Maternal Grandfather    Depression Paternal Grandmother    Suicidality Paternal Grandfather    Depression Paternal Grandfather    Breast cancer Sister    Arthritis  Sister    Colon cancer Neg Hx    Social History   Socioeconomic History   Marital status: Married    Spouse name: Not on file   Number of children: Not on file   Years of education: Not on file   Highest education level: Not on file  Occupational History   Not on file  Tobacco Use   Smoking status: Never   Smokeless tobacco: Never  Vaping Use   Vaping Use: Never used  Substance and Sexual Activity   Alcohol use: No   Drug use: No   Sexual activity: Yes    Birth control/protection: Surgical  Other Topics Concern   Not on file  Social History Narrative   Married (husband patient at [facility]). 3 children. 7 grandkids.       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      Hobbies: time with grandkids, walk when weather nice, reading, tv   Social Determinants of Health   Financial Resource Strain: Low Risk    Difficulty of Paying Living Expenses: Not hard at all  Food Insecurity: No Food Insecurity   Worried About Charity fundraiser in the Last Year: Never true   [place] in the Last Year: Never true  Transportation Needs: No Transportation Needs   Lack of Transportation (Medical): No   Lack of Transportation (Non-Medical): No  Physical Activity: Sufficiently Active   Days of Exercise per Week: 3 days   Minutes of Exercise per Session: 50 min  Stress: No Stress Concern Present   Feeling of Stress : Not at all  Social Connections: Moderately Integrated   Frequency of Communication with Friends and Family: More than three times a week   Frequency of Social Gatherings with Friends and Family: More than three times a week   Attends Religious Services: More than 4 times per year   Active Member of [employer] or Organizations: No   Attends Music therapist: Never   Marital Status: Married    Tobacco Counseling Counseling given: Not Answered   Clinical Intake:  Pre-visit preparation completed: Yes  Pain :  No/denies pain     BMI - recorded: 34.29 Nutritional Status: BMI > 30  Obese Nutritional Risks: None Diabetes: No  How often do you need to have someone help you when you read instructions, pamphlets, or other written materials from your doctor or pharmacy?: 1 - Never  Diabetic?No  Interpreter Needed?: No  Information entered by :: Charlott Rakes, LPN   Activities of Daily Living In your present state of health, do you have any difficulty performing the following activities: 10/24/2020  Hearing? N  Vision? N  Difficulty concentrating or making decisions? N  Walking or climbing stairs? Y  Comment left foot issues  Dressing or bathing? N  Doing  errands, shopping? N  Preparing Food and eating ? N  Using the Toilet? N  In the past six months, have you accidently leaked urine? N  Do you have problems with loss of bowel control? N  Managing your Medications? N  Managing your Finances? N  Housekeeping or managing your Housekeeping? N  Some recent data might be hidden    Patient Care Team: Marin Olp, MD as PCP - General (Family Medicine) Paula Compton, MD as Consulting Physician (Obstetrics and Gynecology) Rolm Bookbinder, MD as Consulting Physician (Dermatology) Letta Pate Luanna Salk, MD as Consulting Physician (Physical Medicine and Rehabilitation) Marygrace Drought, MD as Consulting Physician (Ophthalmology) Garrel Ridgel, DPM as Consulting Physician (Podiatry) [name], [facility] as Pharmacist (Pharmacist)  Indicate any recent Medical Services you may have received from other than [facility] providers in the past year (date may be approximate).     Assessment:   This is a routine wellness examination for Zhuri.  Hearing/Vision screen Hearing Screening - Comments:: Pt denies any hearing issues  Vision Screening - Comments:: Pt follows up with Dr Satira Sark for annual eye exams   Dietary issues and exercise activities discussed: Current Exercise Habits: Home exercise  routine, Type of exercise: Other - see comments (eliptical), Time (Minutes): 50, Frequency (Times/Week): 3, Weekly Exercise (Minutes/Week): 150   Goals Addressed             This Visit's Progress    Patient Stated       Continue exercise        Depression Screen PHQ 2/9 Scores 10/24/2020 10/09/2020 04/04/2020 10/22/2019 10/02/2019 03/29/2019 10/05/2018  PHQ - 2 Score 0 0 0 0 0 0 0  PHQ- 9 Score - 0 0 - 0 0 -    Fall Risk Fall Risk  10/24/2020 04/04/2020 10/22/2019 10/12/2019 10/05/2018  Falls in the past year? 0 0 0 0 0  Number falls in past yr: 0 0 0 0 0  Injury with Fall? 0 0 0 0 0  Risk for fall due to : Impaired vision - Impaired vision;Impaired mobility;Impaired balance/gait - -  Risk for fall due to: Comment - - weakness on ankle at times and affects walking - -  Follow up Falls prevention discussed - - - Education provided;Falls evaluation completed    FALL RISK PREVENTION PERTAINING TO THE HOME:  Any stairs in or around the home? Yes  If so, are there any without handrails? Yes  Home free of loose throw rugs in walkways, pet beds, electrical cords, etc? Yes  Adequate lighting in your home to reduce risk of falls? Yes   ASSISTIVE DEVICES UTILIZED TO PREVENT FALLS:  Life alert? No  Use of a cane, walker or w/c? No  Grab bars in the bathroom? No  Shower chair or bench in shower? No  Elevated toilet seat or a handicapped toilet? No   TIMED UP AND GO:  Was the test performed? No .   Cognitive Function: MMSE - Mini Mental State Exam 04/01/2016  Not completed: (No Data)     6CIT Screen 10/24/2020 10/22/2019 10/05/2018 09/14/2017  What Year? 0 points 0 points 0 points 0 points  What month? 0 points 0 points 0 points 0 points  What time? 0 points - 0 points 0 points  Count back from 20 0 points 0 points 0 points 0 points  Months in reverse 0 points 0 points 0 points 0 points  Repeat phrase 0 points 0 points 0 points -  Total Score 0 -  0 -    Immunizations Immunization  History  Administered Date(s) Administered   Influenza,inj,Quad PF,6+ Mos 11/15/2012, 10/30/2015, 10/12/2019   Influenza-Unspecified 09/25/2013, 11/10/2014, 10/29/2016   PFIZER(Purple Top)SARS-COV-2 Vaccination 02/15/2019, 03/08/2019, 11/09/2019   Pneumococcal Conjugate-13 03/08/2014   Pneumococcal Polysaccharide-23 03/13/2015   Td 01/26/2003   Tdap 10/16/2015   Zoster, Live 12/12/2009    TDAP status: Up to date  Flu Vaccine status: Due, Education has been provided regarding the importance of this vaccine. Advised may receive this vaccine at local pharmacy or Health Dept. Aware to provide a copy of the vaccination record if obtained from local pharmacy or Health Dept. Verbalized acceptance and understanding.  Pneumococcal vaccine status: Up to date  Covid-19 vaccine status: Completed vaccines  Qualifies for Shingles Vaccine? Yes   Zostavax completed No   Shingrix Completed?: No.    Education has been provided regarding the importance of this vaccine. Patient has been advised to call insurance company to determine out of pocket expense if they have not yet received this vaccine. Advised may also receive vaccine at local pharmacy or Health Dept. Verbalized acceptance and understanding.  Screening Tests Health Maintenance  Topic Date Due   Zoster Vaccines- Shingrix (1 of 2) Never done   COVID-19 Vaccine (4 - Booster for Pfizer series) 02/01/2020   INFLUENZA VACCINE  08/25/2020   MAMMOGRAM  06/06/2022   COLONOSCOPY (Pts 45-35yrs Insurance coverage will need to be confirmed)  08/10/2022   TETANUS/TDAP  10/15/2025   DEXA SCAN  Completed   Hepatitis C Screening  Completed   HPV VACCINES  Aged Out    Health Maintenance  Health Maintenance Due  Topic Date Due   Zoster Vaccines- Shingrix (1 of 2) Never done   COVID-19 Vaccine (4 - Booster for Pfizer series) 02/01/2020   INFLUENZA VACCINE  08/25/2020    Colorectal cancer screening: Type of screening: Colonoscopy. Completed  08/09/12. Repeat every 10 years  Mammogram status: Completed 06/05/20. Repeat every year  Bone Density status: Completed 05/31/19. Results reflect: Bone density results: NORMAL. Repeat every 2 years.  Additional Screening:  Hepatitis C Screening:  Completed 03/13/15  Vision Screening: Recommended annual ophthalmology exams for early detection of glaucoma and other disorders of the eye. Is the patient up to date with their annual eye exam?  Yes  Who is the provider or what is the name of the office in which the patient attends annual eye exams? Dr Satira Sark  If pt is not established with a provider, would they like to be referred to a provider to establish care? No .   Dental Screening: Recommended annual dental exams for proper oral hygiene  Community Resource Referral / Chronic Care Management: CRR required this visit?  No   CCM required this visit?  No      Plan:     I have personally reviewed and noted the following in the patient's chart:   Medical and social history Use of alcohol, tobacco or illicit drugs  Current medications and supplements including opioid prescriptions.  Functional ability and status Nutritional status Physical activity Advanced directives List of other physicians Hospitalizations, surgeries, and ER visits in previous 12 months Vitals Screenings to include cognitive, depression, and falls Referrals and appointments  In addition, I have reviewed and discussed with patient certain preventive protocols, quality metrics, and best practice recommendations. A written personalized care plan for preventive services as well as general preventive health recommendations were provided to patient.     Willette Brace, LPN  10/24/2020   Nurse Notes: none

## 2020-12-02 NOTE — Progress Notes (Signed)
Chronic Care Management Pharmacy Note  12/09/2020 Name:  Ann Washington MRN:  [id number] DOB:  [birth year]  Subjective: Ann Washington is an 70 y.o. year old female who is a primary patient of Hunter, Brayton Mars, MD.  The CCM team was consulted for assistance with disease management and care coordination needs.    Engaged with patient by telephone for follow up visit in response to provider referral for pharmacy case management and/or care coordination services.   Consent to Services:  The patient was given information about Chronic Care Management services, agreed to services, and gave verbal consent prior to initiation of services.  Please see initial visit note for detailed documentation.   Patient Care Team: Marin Olp, MD as PCP - General (Family Medicine) Paula Compton, MD as Consulting Physician (Obstetrics and Gynecology) Rolm Bookbinder, MD as Consulting Physician (Dermatology) Letta Pate Luanna Salk, MD as Consulting Physician (Physical Medicine and Rehabilitation) Marygrace Drought, MD as Consulting Physician (Ophthalmology) Garrel Ridgel, DPM as Consulting Physician (Podiatry) Edythe Clarity, [facility] (Pharmacist)  Recent office visits:  10/09/2020 OV (PCP) Marin Olp, MD; chronic follow up, discussed taking Xyzal before bed for sinus concerns   Recent consult visits:  None   Hospital visits:  None  Objective: Lab Results  Component Value Date   CREATININE 0.78 10/09/2020   CREATININE 0.84 04/04/2020   CREATININE 0.78 10/12/2019   GFR 76.23 10/09/2020   GFR 69.99 04/04/2020   GFRNONAA 77 10/12/2019   GFRNONAA 56.64 12/02/2009  Last diabetic Eye exam: No results found for: HMDIABEYEEXA  Last diabetic Foot exam: No results found for: HMDIABFOOTEX  Lab Results  Component Value Date   CHOL 192 10/09/2020   CHOL 212 (H) 10/12/2019   TRIG 76.0 10/09/2020   TRIG 114 10/12/2019   HDL 56.00 10/09/2020   HDL 55 10/12/2019   CHOLHDL 3 10/09/2020    CHOLHDL 3.9 10/12/2019   VLDL 15.2 10/09/2020   VLDL 29.2 09/21/2018   LDLCALC 121 (H) 10/09/2020   LDLCALC 135 (H) 10/12/2019   LDLDIRECT 132.0 04/04/2020   LDLDIRECT 151.0 03/29/2019   Hepatic Function Latest Ref Rng & Units 10/09/2020 04/04/2020 10/12/2019  Total Protein 6.0 - 8.3 g/dL 7.6 7.7 7.6  Albumin 3.5 - 5.2 g/dL 4.3 4.3 -  AST 0 - 37 U/L _0 ALT 0 - 35 U/L _1 Alk Phosphatase 39 - 117 U/L 48 57 -  Total Bilirubin 0.2 - 1.2 mg/dL 0.7 0.7 0.8  Bilirubin, Direct 0.0 - 0.3 mg/dL - - -   Lab Results  Component Value Date/Time   TSH 3.55 03/13/2015 09:08 AM   TSH 3.27 09/12/2014 10:42 AM   CBC Latest Ref Rng & Units 04/04/2020 10/12/2019 09/21/2018  WBC 4.0 - 10.5 K/uL 8.1 7.4 8.5  Hemoglobin 12.0 - 15.0 g/dL 13.6 12.8 13.7  Hematocrit 36.0 - 46.0 % 40.0 38.7 40.7  Platelets 150.0 - 400.0 K/uL 232.0 228 253.0   Lab Results  Component Value Date/Time   VD25OH 35 12/11/2010 09:44 AM    Clinical ASCVD: No  The 10-year ASCVD risk score (Arnett DK, et al., 2019) is: 14.6%   Values used to calculate the score:     Age: 62 years     Sex: Female     Is Non-Hispanic African American: No     Diabetic: No     Tobacco smoker: No     Systolic Blood Pressure: 332 mmHg     Is BP  treated: Yes     HDL Cholesterol: 56 mg/dL     Total Cholesterol: 192 mg/dL    Social History   Tobacco Use  Smoking Status Never  Smokeless Tobacco Never   BP Readings from Last 3 Encounters:  10/09/20 132/82  04/04/20 138/84  10/12/19 128/84   Pulse Readings from Last 3 Encounters:  10/09/20 (!) 54  04/04/20 60  10/12/19 (!) 57   Wt Readings from Last 3 Encounters:  10/09/20 175 lb 9.6 oz (79.7 kg)  04/04/20 177 lb 12.8 oz (80.6 kg)  10/12/19 180 lb (81.6 kg)    Assessment: Review of patient past medical history, allergies, medications, health status, including review of consultants reports, laboratory and other test data, was performed as part of comprehensive evaluation and  provision of chronic care management services.   SDOH:  (Social Determinants of Health) assessments and interventions performed: Yes.  CCM Care Plan Allergies  Allergen Reactions   Amlodipine Besy-Benazepril Hcl Other (See Comments)    Edema to ankle with redness/ but now she is tolerating the current regiment   Medications Reviewed Today     Reviewed by Edythe Clarity, [facility] (Pharmacist) on 12/09/20 at [place] List Status: <None>   Medication Order Taking? Sig Documenting Provider Last Dose Status Informant  amLODipine-valsartan (EXFORGE) 5-320 MG tablet [id number] Yes TAKE 1 TABLET BY MOUTH EVERY DAY Marin Olp, MD Taking Active   escitalopram (LEXAPRO) 5 MG tablet [id number] Yes TAKE 1 TABLET BY MOUTH EVERY DAY Marin Olp, MD Taking Active   fluticasone [facility]) 50 MCG/ACT nasal spray [id number] Yes SPRAY 2 SPRAYS INTO EACH NOSTRIL EVERY DAY Marin Olp, MD Taking Active   latanoprost (XALATAN) 0.005 % ophthalmic solution [id number] Yes 1 drop at bedtime. [provider] Taking Active   Multiple Vitamin (MULTIVITAMIN) tablet [id number] Yes Take 1 tablet by mouth daily. [provider] Taking Active   omeprazole (PRILOSEC) 20 MG capsule [id number] Yes Take 20 mg by mouth daily. [provider] Taking Active   rosuvastatin (CRESTOR) 5 MG tablet [id number] Yes Take 1 tablet (5 mg total) by mouth 3 (three) times a week. Marin Olp, MD Taking Active   timolol (TIMOPTIC) 0.5 % ophthalmic solution [id number] Yes Place 1 drop into both eyes 2 (two) times daily. [provider] Taking Active   zolpidem (AMBIEN) 5 MG tablet [id number] Yes TAKE 1 TABLET BY MOUTH EVERYDAY AT BEDTIME Marin Olp, MD Taking Active            Patient Active Problem List   Diagnosis Date Noted   Osteoarthritis of ankle and foot 12/08/2018   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   Normocytic anemia 09/12/2014   CKD (chronic kidney disease), stage III ([place])  03/14/2014   Glaucoma 03/08/2014   Insomnia 03/08/2014   Stress incontinence, female 10/12/2011   Vaginal atrophy 05/27/2011   Hyperglycemia 12/09/2009   Obesity 10/11/2008   GERD- failed h2 blocker 08/25/2007   Hyperlipidemia 10/13/2006   Major depression, recurrent, full remission ([place]) 10/13/2006   Hypertension, essential 10/10/2006   Immunization History  Administered Date(s) Administered   Influenza,inj,Quad PF,6+ Mos 11/15/2012, 10/30/2015, 10/12/2019   Influenza-Unspecified 09/25/2013, 11/10/2014, 10/29/2016   PFIZER(Purple Top)SARS-COV-2 Vaccination 02/15/2019, 03/08/2019, 11/09/2019   Pneumococcal Conjugate-13 03/08/2014   Pneumococcal Polysaccharide-23 03/13/2015   Td 01/26/2003   Tdap 10/16/2015   Zoster, Live 12/12/2009   Conditions to be addressed/monitored: HTN, HLD, CKD Stage III and Obesity  Care Plan : [place]  Updates  made by Edythe Clarity, RPH since 12/09/2020 12:00 AM     Problem: HTN HLD CKD III Obesity   Priority: High     Goal: Disease Management   Start Date: 04/14/2020  Expected End Date: 04/14/2021  Recent Progress: On track  Priority: High  Note:   Current Barriers:   Unable to independently afford treatment regimen Suboptimal therapeutic regimen for HLD PreDM  Pharmacist Clinical Goal(s):  Over the next 365 days, patient will verbalize ability to afford treatment regimen achieve adherence to monitoring guidelines and medication adherence to achieve therapeutic efficacy through collaboration with PharmD and provider.   Interventions: 1:1 collaboration with Marin Olp, MD regarding development and update of comprehensive plan of care as evidenced by provider attestation and co-signature Inter-disciplinary care team collaboration (see longitudinal plan of care) Comprehensive medication review performed; medication list updated in electronic medical record No Rx changes, tolerating rosuvastatin 31m TIW  well  Hypertension (BP goal <130/80) -Controlled -Reports lower readings at home typically <120s/70s -Current treatment: Amlodipine 5-320 mg once daily -Medications previously tried: Azor 5-40 mg once daily   -Denies hypotensive/hypertensive symptoms Daily salt intake goal < 2300 mg; Recommended to continue current medication  Update 12/09/20 Controlled in office, not checking at home Denies any symptoms of dizziness, etc. No changes needed at this time, continue current medications.  Hyperlipidemia: (LDL goal < 100 next 6 months) -Not ideally controlled -HTN, HLD, BMI ~35 -Current treatment: Rosuvastatin 5 mg three times WEEKLY -Medications previously tried: pravastatin 20 mg daily (sore muscle), fenofibrate 150 mg, rosuvastatin 5 mg once weekly,   -Educated on Cholesterol goals;  -Tolerating rosuvastatin three times weekly - no side effects noted, feels the same as previously. Recommended pt to continue medication - refill sent to pharmacy per patient request  Prediabetes (A1c goal < 5.7) -Not ideally controlled  -03/2020 FBG 100, a1c 6.1% -Current medications: N/a - previously declined metformin -Exercise: has been active outdoors and going to the gym three times a week after work -Diet: has gotten used to unsweetened tea, being mindful of portion control. Some peanut butter crackers maybe a couple times per time, sometimes replacing meals Reviewed several questions related to healthy food choices with patient Pt prefers to continue to work on diet and exercise before considering metformin.  Update 12/09/20 10/09/20 - 6.2 A1c She continues to exercise and watches what she eats. Wants to keep A1c down with lifestyle mods. Continue routine A1c screenings. Doing cardiovascular exercise three times per week at the gym.  Patient Goals/Self-Care Activities Over the next 365 days, patient will:  - take medications as prescribed target a minimum of 150 minutes of moderate  intensity exercise weekly engage in dietary modifications by following plate method discussed  Medication Assistance: None required.  Patient affirms current coverage meets needs.        Follow Up:  Patient agrees to Care Plan and Follow-up. Plan: Telephone follow up appointment with care management team member scheduled for 6 months   Patient's preferred pharmacy is:  [id number] [place], [place] [id number]: [phone]: [phone] Appointments  Date Time Provider [place]3/30/2023  9:40 AM HuMarin OlpMD [facility]10/13/2023  8:45 AM [facility] HEALTH COACH LBGuaynaboPharmD Clinical Pharmacist  [phone]

## 2020-12-04 ENCOUNTER — Other Ambulatory Visit: Payer: Self-pay | Admitting: Family Medicine

## 2020-12-09 ENCOUNTER — Ambulatory Visit (INDEPENDENT_AMBULATORY_CARE_PROVIDER_SITE_OTHER): Payer: Medicare Other | Admitting: Pharmacist

## 2020-12-09 DIAGNOSIS — I10 Essential (primary) hypertension: Secondary | ICD-10-CM

## 2020-12-09 DIAGNOSIS — R739 Hyperglycemia, unspecified: Secondary | ICD-10-CM

## 2020-12-09 NOTE — Patient Instructions (Addendum)
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        Patient Care Plan: [place] Plan     Problem Identified: HTN HLD CKD III Obesity   Priority: High     Goal: Disease Management   Start Date: 04/14/2020  Expected End Date: 04/14/2021  Recent Progress: On track  Priority: High  Note:   Current Barriers:   Unable to independently afford treatment regimen Suboptimal therapeutic regimen for HLD PreDM  Pharmacist Clinical Goal(s):  Over the next 365 days, patient will verbalize ability to afford treatment regimen achieve adherence to monitoring guidelines and medication adherence to achieve therapeutic efficacy through collaboration with PharmD and provider.   Interventions: 1:1 collaboration with Marin Olp, MD regarding development and update of comprehensive plan of care as evidenced by provider attestation and co-signature Inter-disciplinary care team collaboration (see longitudinal plan of care) Comprehensive medication review performed; medication list updated in electronic medical record No Rx changes, tolerating rosuvastatin 5mg  TIW well  Hypertension (BP goal <130/80) -Controlled -Reports lower readings at home typically <120s/70s -Current treatment: Amlodipine 5-320 mg once daily -Medications previously tried: Azor 5-40 mg once daily   -Denies hypotensive/hypertensive symptoms Daily salt intake goal < 2300 mg; Recommended to continue current medication  Update 12/09/20 Controlled in office, not checking at home Denies any symptoms of dizziness, etc. No changes needed at this time, continue current medications.  Hyperlipidemia: (LDL goal < 100 next 6 months) -Not ideally controlled -HTN, HLD, BMI ~35 -Current treatment: Rosuvastatin 5 mg three times  WEEKLY -Medications previously tried: pravastatin 20 mg daily (sore muscle), fenofibrate 150 mg, rosuvastatin 5 mg once weekly,   -Educated on Cholesterol goals;  -Tolerating rosuvastatin three times weekly - no side effects noted, feels the same as previously. Recommended pt to continue medication - refill sent to pharmacy per patient request  Prediabetes (A1c goal < 5.7) -Not ideally controlled  -03/2020 FBG 100, a1c 6.1% -Current medications: N/a - previously declined metformin -Exercise: has been active outdoors and going to the gym three times a week after work -Diet: has gotten used to unsweetened tea, being mindful of portion control. Some peanut butter crackers maybe a couple times per time, sometimes replacing meals Reviewed several questions related to healthy food choices with patient Pt prefers to continue to work on diet and exercise before considering metformin.  Update 12/09/20 10/09/20 - 6.2 A1c She continues to exercise and watches what she eats. Wants to keep A1c down with lifestyle mods. Continue routine A1c screenings. Doing cardiovascular exercise three times per week at the gym.  Patient Goals/Self-Care Activities Over the next 365 days, patient will:  - take medications as prescribed target a minimum of 150 minutes of moderate intensity exercise weekly engage in dietary modifications by following plate method discussed  Medication Assistance: None required.  Patient affirms current coverage meets needs.         Patient verbalizes understanding of instructions provided today and agrees to view in [place].  Telephone follow up appointment with pharmacy team member scheduled for: 6 months  Edythe Clarity, [place]

## 2020-12-24 DIAGNOSIS — I10 Essential (primary) hypertension: Secondary | ICD-10-CM | POA: Diagnosis not present

## 2021-01-29 ENCOUNTER — Other Ambulatory Visit: Payer: Self-pay | Admitting: Family Medicine

## 2021-02-17 ENCOUNTER — Telehealth: Payer: Self-pay | Admitting: Family Medicine

## 2021-02-17 NOTE — Telephone Encounter (Signed)
Patient notified and verbalized understanding. 

## 2021-02-17 NOTE — Telephone Encounter (Signed)
Increasing fluids and cutting blood pressure medicine in half short-term is very reasonable since she does not want to schedule a visit at this time-if fails to improve would encourage visit.  If blood pressure gets above 140/90 should restart full dose of blood pressure medicine

## 2021-02-17 NOTE — Telephone Encounter (Signed)
Pt states she was sick with a stomach flu over the weekend. She is getting better but she is still feeling weak. She also states her blood pressure is down to 120/66. [facility] to [facility] and they advised her to cut her blood pressure meds in half. I asked her to come in for an appt but she refused. She wants to know what Dr Yong Channel would like for her to do.

## 2021-02-22 ENCOUNTER — Other Ambulatory Visit: Payer: Self-pay

## 2021-02-22 ENCOUNTER — Emergency Department (HOSPITAL_BASED_OUTPATIENT_CLINIC_OR_DEPARTMENT_OTHER)
Admission: EM | Admit: 2021-02-22 | Discharge: 2021-02-22 | Disposition: A | Discharge status: Home / Self Care | Payer: Medicare Other | Attending: Emergency Medicine | Admitting: Emergency Medicine

## 2021-02-22 DIAGNOSIS — R531 Weakness: Secondary | ICD-10-CM | POA: Diagnosis not present

## 2021-02-22 DIAGNOSIS — Z20822 Contact with and (suspected) exposure to covid-19: Secondary | ICD-10-CM | POA: Insufficient documentation

## 2021-02-22 DIAGNOSIS — Z79899 Other long term (current) drug therapy: Secondary | ICD-10-CM | POA: Diagnosis not present

## 2021-02-22 DIAGNOSIS — D72829 Elevated white blood cell count, unspecified: Secondary | ICD-10-CM | POA: Insufficient documentation

## 2021-02-22 DIAGNOSIS — R52 Pain, unspecified: Secondary | ICD-10-CM | POA: Diagnosis not present

## 2021-02-22 DIAGNOSIS — R630 Anorexia: Secondary | ICD-10-CM | POA: Diagnosis not present

## 2021-02-22 DIAGNOSIS — R5383 Other fatigue: Secondary | ICD-10-CM | POA: Insufficient documentation

## 2021-02-22 LAB — RESP PANEL BY RT-PCR (FLU A&B, COVID) ARPGX2
Influenza A by PCR: NEGATIVE
Influenza B by PCR: NEGATIVE
SARS Coronavirus 2 by RT PCR: NEGATIVE

## 2021-02-22 LAB — CBC WITH DIFFERENTIAL/PLATELET
Abs Immature Granulocytes: 0.04 10*3/uL (ref 0.00–0.07)
Basophils Absolute: 0 10*3/uL (ref 0.0–0.1)
Basophils Relative: 0 %
Eosinophils Absolute: 0.1 10*3/uL (ref 0.0–0.5)
Eosinophils Relative: 1 %
HCT: 40.4 % (ref 36.0–46.0)
Hemoglobin: 13.7 g/dL (ref 12.0–15.0)
Immature Granulocytes: 0 %
Lymphocytes Relative: 21 %
Lymphs Abs: 2.4 10*3/uL (ref 0.7–4.0)
MCH: 30.5 pg (ref 26.0–34.0)
MCHC: 33.9 g/dL (ref 30.0–36.0)
MCV: 90 fL (ref 80.0–100.0)
Monocytes Absolute: 0.9 10*3/uL (ref 0.1–1.0)
Monocytes Relative: 8 %
Neutro Abs: 8 10*3/uL — ABNORMAL HIGH (ref 1.7–7.7)
Neutrophils Relative %: 70 %
Platelets: 298 10*3/uL (ref 150–400)
RBC: 4.49 MIL/uL (ref 3.87–5.11)
RDW: 12.2 % (ref 11.5–15.5)
WBC: 11.5 10*3/uL — ABNORMAL HIGH (ref 4.0–10.5)
nRBC: 0 % (ref 0.0–0.2)

## 2021-02-22 LAB — BASIC METABOLIC PANEL
Anion gap: 8 (ref 5–15)
BUN: 12 mg/dL (ref 8–23)
CO2: 27 mmol/L (ref 22–32)
Calcium: 10.1 mg/dL (ref 8.9–10.3)
Chloride: 104 mmol/L (ref 98–111)
Creatinine, Ser: 0.7 mg/dL (ref 0.44–1.00)
GFR, Estimated: >60 mL/min (ref >60)
Glucose, Bld: 115 mg/dL — ABNORMAL HIGH (ref 70–99)
Potassium: 4.2 mmol/L (ref 3.5–5.1)
Sodium: 139 mmol/L (ref 135–145)

## 2021-02-22 MED ORDER — HYDROXYZINE HCL 25 MG PO TABS: 25.0000 mg | ORAL_TABLET | Freq: Every evening | ORAL | 0 refills | Status: DC | PRN

## 2021-02-22 NOTE — ED Notes (Addendum)
Pt stated that she had a recent hx of a "stomach bug" (abdominal pain and diarrhea) for a day last week. Pt stated that starting last night she "just don't feel right". Has no appetite, increase in smell and just a feeling of "blah". No recent diagnoses or been around anyone that's been sick. Pt denies pain anywhere, no current N/V/D.

## 2021-02-22 NOTE — ED Triage Notes (Signed)
Decreased appetite, body aches and generalized fatigue x 3 days. Denies any pain.

## 2021-02-22 NOTE — Discharge Instructions (Addendum)
Your work-up today was negative for COVID and flu.  The rest of your lab work was also reassuring.  I would follow-up with your primary care doctor to reassess your medication changes and to see if they need to do further evaluation.  I have sent you in a few tablets of hydroxyzine which you can take about 30 to 60 minutes before bed to help with insomnia.  Hope he feel better soon.

## 2021-02-22 NOTE — ED Provider Notes (Signed)
[facility] EMERGENCY DEPT Provider Note   CSN: [id number] Arrival date & time: 02/22/21  1257     History  Chief Complaint  Patient presents with   Fatigue   Generalized Body Aches    Ann Washington is a 71 y.o. female.  Patient presents for nonspecific symptoms including generalized body aches, fatigue and decreased appetite.  She states that she feels a little weak as well.  She is not in any pain and denies URI symptoms including cough, congestion, fevers, chills, abdominal pain, nausea, vomiting, diarrhea.  She took Tylenol last night without any improvement.  She states that she has not been sleeping very well.  She reportedly switched from Lexapro to another medication back in October.  She was reading on Google that there may be a relapse in depression symptoms within the 90 days after switching.  She wants to rule out COVID and flu before she goes on that avenue.       Home Medications Prior to Admission medications   Medication Sig Start Date End Date Taking? Authorizing Provider  hydrOXYzine (ATARAX) 25 MG tablet Take 1 tablet (25 mg total) by mouth at bedtime as needed. 02/22/21  Yes Kathe Becton R, PA-C  amLODipine-valsartan (EXFORGE) 5-320 MG tablet TAKE 1 TABLET BY MOUTH EVERY DAY 01/29/21   Marin Olp, MD  escitalopram (LEXAPRO) 5 MG tablet TAKE 1 TABLET BY MOUTH EVERY DAY 12/04/20   Marin Olp, MD  fluticasone [facility]) 50 MCG/ACT nasal spray SPRAY 2 SPRAYS INTO EACH NOSTRIL EVERY DAY 09/24/20   Marin Olp, MD  latanoprost (XALATAN) 0.005 % ophthalmic solution 1 drop at bedtime.    [provider]  Multiple Vitamin (MULTIVITAMIN) tablet Take 1 tablet by mouth daily.    [provider]  omeprazole (PRILOSEC) 20 MG capsule Take 20 mg by mouth daily.    [provider]  rosuvastatin (CRESTOR) 5 MG tablet Take 1 tablet (5 mg total) by mouth 3 (three) times a week. 06/11/20   Marin Olp, MD  timolol  (TIMOPTIC) 0.5 % ophthalmic solution Place 1 drop into both eyes 2 (two) times daily. 03/03/17   [provider]  zolpidem (AMBIEN) 5 MG tablet TAKE 1 TABLET BY MOUTH EVERYDAY AT BEDTIME 10/09/20   Marin Olp, MD      Allergies    Amlodipine besy-benazepril hcl    Review of Systems   Review of Systems  Constitutional:  Positive for appetite change and fatigue. Negative for fever.  HENT: Negative.    Eyes: Negative.   Respiratory:  Negative for shortness of breath.   Cardiovascular: Negative.   Gastrointestinal:  Negative for abdominal pain and vomiting.  Endocrine: Negative.   Genitourinary: Negative.   Musculoskeletal: Negative.   Skin:  Negative for rash.  Neurological:  Negative for headaches.  All other systems reviewed and are negative.  Physical Exam Updated Vital Signs BP (!) 142/62    Pulse (!) 58    Temp 98.4 F (36.9 C) (Oral)    Resp (!) 25    Ht 5\' 1"  (1.549 m)    Wt 79.4 kg    SpO2 97%    BMI 33.07 kg/m  Physical Exam Vitals and nursing note reviewed.  Constitutional:      General: She is not in acute distress.    Appearance: She is not ill-appearing.  HENT:     Head: Atraumatic.  Eyes:     Conjunctiva/sclera: Conjunctivae normal.  Cardiovascular:  Rate and Rhythm: Normal rate and regular rhythm.     Pulses: Normal pulses.     Heart sounds: No murmur heard. Pulmonary:     Effort: Pulmonary effort is normal. No respiratory distress.     Breath sounds: Normal breath sounds.  Abdominal:     General: Abdomen is flat. There is no distension.     Palpations: Abdomen is soft.     Tenderness: There is no abdominal tenderness.  Musculoskeletal:        General: Normal range of motion.     Cervical back: Normal range of motion.  Skin:    General: Skin is warm and dry.     Capillary Refill: Capillary refill takes less than 2 seconds.  Neurological:     General: No focal deficit present.     Mental Status: She is alert.  Psychiatric:         Mood and Affect: Mood normal.    ED Results / Procedures / Treatments   Labs (all labs ordered are listed, but only abnormal results are displayed) Labs Reviewed  BASIC METABOLIC PANEL - Abnormal; Notable for the following components:      Result Value   Glucose, Bld 115 (*)    All other components within normal limits  CBC WITH DIFFERENTIAL/PLATELET - Abnormal; Notable for the following components:   WBC 11.5 (*)    Neutro Abs 8.0 (*)    All other components within normal limits  RESP PANEL BY RT-PCR (FLU A&B, COVID) [id number]    EKG None  Radiology No results found.  Procedures Procedures    Medications Ordered in ED Medications - No data to display  ED Course/ Medical Decision Making/ A&P Clinical Course as of 02/22/21 1613  Sun Feb 22, 2021  1434 Resp Panel by RT-PCR (Flu A&B, Covid) Nasopharyngeal Swab Respiratory panel negative [EC]  1508 CBC with Differential(!) CBC with mild leukocytosis [EC]  2130 Basic metabolic panel(!) BMP normal [EC]    Clinical Course User Index [EC] Tonye Pearson, PA-C                           Medical Decision Making Amount and/or Complexity of Data Reviewed Labs: ordered. Decision-making details documented in ED Course.  Risk Prescription drug management.   History:  Patient presents for nonspecific symptoms including generalized body aches, fatigue and decreased appetite.  She states that she feels a little weak as well.  She is not in any pain and denies URI symptoms including cough, congestion, fevers, chills, abdominal pain, nausea, vomiting, diarrhea.  She took Tylenol last night without any improvement.  She states that she has not been sleeping very well.  She reportedly switched from Lexapro to another medication back in October.  She was reading on Google that there may be a relapse in depression symptoms within the 90 days after switching.  She wants to rule out COVID and flu before she goes on that avenue. Additional  history obtained from husband This patient presents to the ED for concern of fatigue, this involves an extensive number of treatment options, and is a complaint that carries with it a high risk of complications and morbidity.   Differentials include URI, neoplasm, medication change, depression, anxiety, infection  Initial impression:  Patient is sitting up on the side of the bed, not ill-appearing or toxic appearing.  Vitals are normal.  Physical exam was normal.  This may be due to mental health  and subsequent medication changes for this in recent months.  However, we our pending respiratory panel and basic labs.  If this is normal, she can likely be discharged home to follow-up with her primary care provider.  Lab Tests and EKG:  I Ordered, reviewed, and interpreted labs and EKG.  The pertinent results in my decision-making regarding them are detailed in the ED course and/or initial impression section above.   Cardiac Monitoring:  The patient was maintained on a cardiac monitor.  I personally viewed and interpreted the cardiac monitored which showed an underlying rhythm of: NSR   Disposition:  After consideration of the diagnostic results, physical exam, history and the patients response to treatment feel that the patent would benefit from discharge with outpatient follow-up.   Fatigue: Patient's respiratory panel negative for COVID and flu.  CBC with mild leukocytosis, BMP normal.  Patient's physical exam was reassuring.  This may be from her medication changed from Lexapro.  She is to follow-up with her PCP for reevaluation.  She requests a few pills to help her sleep at night as this is one of her primary complaints.  Prescribed a few tablets of hydroxyzine at a low dose.  She states that melatonin does not work for her.  All questions asked and answered.  Discharged home in good condition.   Final Clinical Impression(s) / ED Diagnoses Final diagnoses:  Fatigue, unspecified type     Rx / DC Orders ED Discharge Orders          Ordered    hydrOXYzine (ATARAX) 25 MG tablet  At bedtime PRN        02/22/21 1548              Tonye Pearson, PA-C 02/22/21 [place], [place], DO 02/23/21 [phone]

## 2021-02-23 ENCOUNTER — Encounter: Payer: Self-pay | Admitting: Family Medicine

## 2021-02-23 ENCOUNTER — Telehealth: Payer: Self-pay

## 2021-02-23 NOTE — Telephone Encounter (Signed)
Willing to work pt in today?

## 2021-02-23 NOTE — Telephone Encounter (Signed)
Patient has called back stating she is requesting Dr. Yong Washington to [facility] [address]    I have advised patient of turnaround time and asked if patient would like to schedule with another provider.   Patient refused.

## 2021-02-23 NOTE — Telephone Encounter (Signed)
Patient called in and stated she went to the Ed yesterday she thought she had covid or the flu which both came back negative. Patient wants to see if Dr. Yong Channel would work patient in thinks it might be her depression coming back she said she been having trouble sleeping as well. Please advise.

## 2021-02-23 NOTE — Telephone Encounter (Signed)
How about a 4 40 virtual tomorrow (only if video would work)  or if she cannot do that 4 20 in person visit tomorrow/Tuesday the 31st to discuss?

## 2021-02-24 ENCOUNTER — Ambulatory Visit: Payer: Medicare Other | Admitting: Family Medicine

## 2021-02-24 ENCOUNTER — Other Ambulatory Visit: Payer: Self-pay

## 2021-02-24 ENCOUNTER — Encounter: Payer: Self-pay | Admitting: Family Medicine

## 2021-02-24 VITALS — BP 132/82 | HR 76 | Temp 98.2°F | Ht 61.0 in | Wt 176.2 lb

## 2021-02-24 DIAGNOSIS — F331 Major depressive disorder, recurrent, moderate: Secondary | ICD-10-CM | POA: Diagnosis not present

## 2021-02-24 DIAGNOSIS — E785 Hyperlipidemia, unspecified: Secondary | ICD-10-CM

## 2021-02-24 DIAGNOSIS — I10 Essential (primary) hypertension: Secondary | ICD-10-CM | POA: Diagnosis not present

## 2021-02-24 MED ORDER — ESCITALOPRAM OXALATE 10 MG PO TABS: 10.0000 mg | ORAL_TABLET | Freq: Every day | ORAL | 3 refills | Status: DC

## 2021-02-24 NOTE — Telephone Encounter (Signed)
Patient has been scheduled for this afternoon.

## 2021-02-24 NOTE — Patient Instructions (Addendum)
Sorry depression has worsened- I think we will get you feeling better back on meds  Trial lexapro 5 mg for 3-4 days and if no significant side effects go up to 10 mg  Schedule labs in 10-14 days  Ok to hold rosuvastatin for the next month then restart with once or twice a week per your preference  Taking the medicine as directed and not missing any doses is one of the best things you can do to treat your depression.  Here are some things to keep in mind:  Side effects (stomach upset, some increased anxiety) may happen before you notice a benefit.  These side effects typically go away over time. Changes to your dose of medicine or a change in medication all together is sometimes necessary Most people need to be on medication at least 6-12 months but I am recommend long term use with recent recurrence Many people will notice an improvement within two weeks but the full effect of the medication can take up to 6-8 weeks Stopping the medication when you start feeling better often results in a return of symptoms If you start having thoughts of hurting yourself or others after starting this medicine, call our office immediately at [phone] or seek care through 911.    Recommended follow up: Return in about 2 months (around 04/24/2021) for follow up- or sooner if needed.

## 2021-02-24 NOTE — Telephone Encounter (Signed)
Noted  

## 2021-02-24 NOTE — Progress Notes (Signed)
Phone [phone] In person visit   Subjective:   Ann Washington is a 71 y.o. year old very pleasant female patient who presents for/with See problem oriented charting Chief Complaint  Patient presents with   Follow-up   Depression    Pt c/o feeling down and out since last Friday.    This visit occurred during the SARS-CoV-2 public health emergency.  Safety protocols were in place, including screening questions prior to the visit, additional usage of staff PPE, and extensive cleaning of exam room while observing appropriate contact time as indicated for disinfecting solutions.   Past Medical History-  Patient Active Problem List   Diagnosis Date Noted   CKD (chronic kidney disease), stage III ([place]) 03/14/2014    Priority: Medium    Glaucoma 03/08/2014    Priority: Medium    Insomnia 03/08/2014    Priority: Medium    Hyperglycemia 12/09/2009    Priority: Medium    Hyperlipidemia 10/13/2006    Priority: Medium    Major depression, recurrent, full remission ([place]) 10/13/2006    Priority: Medium    Hypertension, essential 10/10/2006    Priority: Medium    Normocytic anemia 09/12/2014    Priority: Low   Stress incontinence, female 10/12/2011    Priority: Low   Vaginal atrophy 05/27/2011    Priority: Low   Obesity 10/11/2008    Priority: Low   GERD- failed h2 blocker 08/25/2007    Priority: Low   Osteoarthritis of ankle and foot 12/08/2018   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 90 tablet 3   fluticasone (FLONASE) 50 MCG/ACT nasal spray SPRAY 2 SPRAYS INTO EACH NOSTRIL EVERY DAY 48 mL 1   latanoprost (XALATAN) 0.005 % ophthalmic solution 1 drop at bedtime.     Multiple Vitamin (MULTIVITAMIN) tablet Take 1  tablet by mouth daily.     omeprazole (PRILOSEC) 20 MG capsule Take 20 mg by mouth daily.     rosuvastatin (CRESTOR) 5 MG tablet Take 1 tablet (5 mg total) by mouth 3 (three) times a week. 39 tablet 3   timolol (TIMOPTIC) 0.5 % ophthalmic solution Place 1 drop into both eyes 2 (two) times daily.  0   zolpidem (AMBIEN) 5 MG tablet TAKE 1 TABLET BY MOUTH EVERYDAY AT BEDTIME 30 tablet 5   No current facility-administered medications for this visit.     Objective:  BP 132/82    Pulse 76    Temp 98.2 F (36.8 C)    Ht 5\' 1"  (1.549 m)    Wt 176 lb 3.2 oz (79.9 kg)    SpO2 96%    BMI 33.29 kg/m  Gen: NAD, resting comfortably CV: RRR no murmurs rubs or gallops Lungs: CTAB no crackles, wheeze, rhonchi Ext: no edema Skin: warm, dry     Assessment and Plan   #Depression  S: Medication: Previously was on Lexapro 10 mg but she had wanted to try off medication.  She stopped taking this back in October after weaning off.  She reports feeling down since at least last Friday if not 2 weeks ago but did not feel worsening depression prior to that- had a stomach bug and felt better after recovering.  She felt so fatigued she even went in  to draw a bridge emergency room-tested negative for COVID and flu.  She was given some hydroxyzine in the ED-had previously been given some Ambien from our office back in September.  That she feels very down and drained with lack of motivation and anhedonia - she has been sleeping very poorly. Hydroxyzine didn't help.  - with zolpidem gets 4-5 hours - very low appetite -opts out of therapy/counseling  Husband is with patient today and apparently in 2006 when she initially presented with depression had very similar symptoms-she had rather significant disease and she was actually hospitalized for 3 days.  Apparently there was suicidal ideation at that time. Depression screen [facility] 2/9 02/24/2021 10/24/2020 10/09/2020  Decreased Interest 3 0 0  Down, Depressed, Hopeless 3 0 0   PHQ - 2 Score 6 0 0  Altered sleeping 3 - 0  Tired, decreased energy 3 - 0  Change in appetite 3 - 0  Feeling bad or failure about yourself  0 - 0  Trouble concentrating 0 - 0  Moving slowly or fidgety/restless 0 - 0  Suicidal thoughts 0 - 0  PHQ-9 Score 15 - 0  Difficult doing work/chores Somewhat difficult - Not difficult at all  Some recent data might be hidden  A/P: Moderate depression with recurrence/worsening off of Lexapro-we discussed now that she has had a recurrence she will need to be on medication long-term-she is in complete agreement and so is her husband.  We will restart Lexapro 10 mg but take just 5 mg for the first few days to make sure she tolerates that -In regards to insomnia hopeful that will improve as Lexapro gets back in her system-but she can still typically get 4 to 5 hours of sleep on the Ambien-discussed reasons why we should not increase to 10 mg dose as it is above the max for her age and gender - I would like to recheck CBC, CMP, TSH-had mild leukocytosis but would like to wait 10 to 14 days-she agrees to schedule lab follow-up -No thoughts of self-harm and agrees to call us immediately or contact her husband immediately if any thoughts develop -Has been with patient today and very supportive and informative  #Hypertension/CKD stage III S: Compliant with amlodipine-valsartan 5-320 mg- only taking half right now after prior GI illness and blood pressure running lower.  Knows to avoid  BP Readings from Last 3 Encounters:  02/24/21 132/82  02/22/21 (!) 142/62  10/09/20 132/82  A/P: Blood pressure well controlled but trending up from prior low she reported under 120-suggested potentially increasing back to full dose at this point-patient wants to monitor and only increase if systolic is above [id number] seems reasonable to me  #Hyperlipidemia S: Poorly controlled on red yeast rice alone in past- later on rosuvastatin 5 mg twice a week starting 10/18/19- increased  to 3x a week back in September (she reports had similar issues in past with crestor).  Myalgias on pravastatin in the past. -sensation of leg weakness on 3x a week A/P: With sensation of leg weakness and wanting to remove any modifying factors for depression-opted to stop medicine for 1 month and then restart at once or twice a month rosuvastatin 5 mg-could titrate up further in the future if needed  Recommended follow up: Return in about 2 months (around 04/24/2021) for follow up- or sooner if needed. Future Appointments  Date Time Provider [place]  03/06/2021  8:45 AM [facility] LAB [facility]  04/23/2021  9:40 AM Marin Olp,  MD [facility]  06/22/2021  4:15 PM [facility] CCM PHARMACIST [facility]  11/06/2021  8:45 AM [facility] HEALTH COACH [facility]    Lab/Order associations:   ICD-10-CM   1. Hypertension, essential  I10 CBC with Differential/Platelet    Comprehensive metabolic panel    TSH    2. Hyperlipidemia, unspecified hyperlipidemia type  E78.5 CBC with Differential/Platelet    Comprehensive metabolic panel    TSH      Meds ordered this encounter  Medications   escitalopram (LEXAPRO) 10 MG tablet    Sig: Take 1 tablet (10 mg total) by mouth daily.    Dispense:  90 tablet    Refill:  3    Return precautions advised.  Garret Reddish, MD

## 2021-02-24 NOTE — Telephone Encounter (Signed)
Patient has been scheduled for in office said she preferred in office.

## 2021-02-25 ENCOUNTER — Encounter: Payer: Self-pay | Admitting: Family Medicine

## 2021-02-26 MED ORDER — LORAZEPAM 0.5 MG PO TABS: 0.5000 mg | ORAL_TABLET | Freq: Every evening | ORAL | 0 refills | Status: DC | PRN

## 2021-03-03 ENCOUNTER — Ambulatory Visit (HOSPITAL_COMMUNITY)
Admission: EM | Admit: 2021-03-03 | Discharge: 2021-03-03 | Disposition: A | Discharge status: Home / Self Care | Payer: Medicare Other | Attending: Psychiatry | Admitting: Psychiatry

## 2021-03-03 DIAGNOSIS — R450 Nervousness: Secondary | ICD-10-CM | POA: Diagnosis not present

## 2021-03-03 DIAGNOSIS — F411 Generalized anxiety disorder: Secondary | ICD-10-CM | POA: Diagnosis not present

## 2021-03-03 DIAGNOSIS — F5101 Primary insomnia: Secondary | ICD-10-CM | POA: Insufficient documentation

## 2021-03-03 DIAGNOSIS — F32A Depression, unspecified: Secondary | ICD-10-CM | POA: Diagnosis not present

## 2021-03-03 DIAGNOSIS — Z91128 Patient's intentional underdosing of medication regimen for other reason: Secondary | ICD-10-CM | POA: Insufficient documentation

## 2021-03-03 DIAGNOSIS — T43226A Underdosing of selective serotonin reuptake inhibitors, initial encounter: Secondary | ICD-10-CM | POA: Diagnosis not present

## 2021-03-03 NOTE — Discharge Instructions (Addendum)
Take all medications as prescribed. Keep all follow-up appointments as scheduled.  Do not consume alcohol or use illegal drugs while on prescription medications. Report any adverse effects from your medications to your primary care provider promptly.  In the event of recurrent symptoms or worsening symptoms, call 911, a crisis hotline, or go to the nearest emergency department for evaluation.   

## 2021-03-03 NOTE — Progress Notes (Signed)
°   03/03/21 1600  [place] (Walk-ins at [facility] only)  How Did You Hear About [place]? Family/Friend  What Is the Reason for Your Visit/Call Today? Patient presents wtih her husband due to concerns she is having difficulty with tapering off of lexapro.  Her PCP has agreed to manage this taper, as she felt she was on too many medications and decided she could d/c the lexapro.  She has recently had difficulty sleeping and is only napping.  This has been ongoing for the past 10 days.  Patient and husband have been trying to connect with a psychitrist to assist wtih taper and address other related symptoms.  They have been told it will be 2+ weeks before she can be seen.  Patient's husband feels this is more urgent and patient needed to be seen today.   Patient denies SI, HI, AVH or SA hx.  They were informed that medications would not be adjusted/managed in this setting.  They were provided with referral information for providers who may be able to see patient within the next 2-3 days.  How Long Has This Been Causing You Problems? 1-6 months  Have You Recently Had Any Thoughts About Hurting Yourself? No  Are You Planning to Commit Suicide/Harm Yourself At This time? No  Have you Recently Had Thoughts About [place]? No  Are You Planning To Harm Someone At This Time? No  Are you currently experiencing any auditory, visual or other hallucinations? No  Have You Used Any Alcohol or Drugs in the Past 24 Hours? No  Do you have any current medical co-morbidities that require immediate attention? No  Clinician description of patient physical appearance/behavior: Patient is calm, cooperative and pleasant.  AAOx5  What Do You Feel Would Help You the Most Today? Medication(s)  If access to [facility] was not available, would you have sought care in the Emergency Department? No  Determination of Need Routine (7 days)  Options For Referral Medication Management

## 2021-03-03 NOTE — ED Provider Notes (Signed)
Behavioral Health Urgent Care Medical Screening Exam  Patient Name: Ann Washington MRN: [id number] Date of Evaluation: 03/03/21 Chief Complaint:   Diagnosis:  Final diagnoses:  Generalized anxiety disorder  Primary insomnia    History of Present illness: Ann Washington is a 71 y.o. female. patient presented to [facility] as a walk in  accompanied by her husband with complaints of insomnia.   Ann Washington, 71 y.o., female patient seen face to face by this provider and chart reviewed on 03/03/21.  On evaluation Ann Washington reports  Ann Washington reported history of depression when Ann Washington was recently discontinued from Lexapro as Ann Washington states Ann Washington had been on Lexapro for 15+ years.  States Ann Washington was feeling better and decided to titrate off the medication.  Ann Washington is denying suicidal homicidal ideations.  Denies auditory or visual hallucinations.  States Ann Washington has not been able to get any sleep.  States Ann Washington has tried multiple medication combinations that have been ineffective.  Ann Washington reports Ann Washington has made outpatient follow-up appointments however the appointments are 2 to 3 weeks out.  Patient was offered hydroxyzine however states that it is not helpful and was provided a prescription 2 days prior by the local urgent care.  Patient was provided with additional outpatient resources for [place] and consider [facility] for medication management.   During evaluation Ann Washington is sitting in no acute distress. Ann Washington is alert/oriented x 4; calm/cooperative; and mood congruent with affect. Ann Washington is speaking in a clear tone at moderate volume, and normal pace; with good eye contact.  Her thought process is coherent and relevant; There is no indication that Ann Washington is currently responding to internal/external stimuli or experiencing delusional thought content; and Ann Washington has denied suicidal/self-harm/homicidal ideation, psychosis, and paranoia.   Patient has remained calm throughout assessment and has answered questions  appropriately.     At this time Ann Washington is educated and verbalizes understanding of mental health resources and other crisis services in the community.Ann Washington is instructed to call 911 and present to the nearest emergency room should Ann Washington experience any suicidal/homicidal ideation, auditory/visual/hallucinations, or detrimental worsening of her mental health condition.Ann Washington was a also advised by Probation officer that Ann Washington could call the toll-free phone on insurance card to assist with identifying in network counselors and agencies or number on back of Medicaid card to  speak with care coordinator   Psychiatric Specialty Exam  Presentation  General Appearance:Appropriate for Environment  Eye Contact:Good  Speech:Clear and Coherent  Speech Volume:Normal  Handedness:Left   Mood and Affect  Mood:Anxious; Depressed Affect:Congruent  Thought Process  Thought Processes:Disorganized Descriptions of Associations:Intact  Orientation:Full (Time, Place and Person)  Thought Content:Logical    Hallucinations:None  Ideas of Reference:None  Suicidal Thoughts:No  Homicidal Thoughts:No   Sensorium  Memory:Remote Fair; Immediate Fair; Immediate Good Judgment:Fair Insight:No data recorded  Executive Functions  Concentration:Fair Attention Span:Fair Recall:Good Fund of Knowledge:Good Language:Good  Psychomotor Activity  Psychomotor Activity:Normal  Assets  Assets:Desire for Improvement  Sleep  Sleep:Poor Number of hours: No data recorded  Nutritional Assessment (For OBS and FBC admissions only) Has the patient had a weight loss or gain of 10 pounds or more in the last 3 months?: No Has the patient had a decrease in food intake/or appetite?: No Does the patient have dental problems?: No Does the patient have eating habits or behaviors that may be indicators of an eating disorder including binging or inducing vomiting?: No Has the patient recently lost weight without trying?:  0  Has the patient been eating poorly because of a decreased appetite?: 0 Malnutrition Screening Tool Score: 0    Physical Exam: Physical Exam Vitals and nursing note reviewed.  HENT:     Head: Normocephalic.  Cardiovascular:     Rate and Rhythm: Normal rate.  Neurological:     Mental Status: Ann Washington is oriented to person, place, and time.  Psychiatric:        Mood and Affect: Mood normal.        Thought Content: Thought content normal.   Review of Systems  Cardiovascular: Negative.   Gastrointestinal: Negative.   Psychiatric/Behavioral:  Negative for depression. The patient is nervous/anxious and has insomnia.   All other systems reviewed and are negative. Blood pressure (!) 159/89, pulse 96, temperature 97.9 F (36.6 C), temperature source Oral, resp. rate 19, SpO2 99 %. There is no height or weight on file to calculate BMI.  Musculoskeletal: Strength & Muscle Tone: within normal limits Gait & Station: normal Patient leans: N/A   [place] MSE Discharge Disposition for Follow up and Recommendations: Based on my evaluation the patient does not appear to have an emergency medical condition and can be discharged with resources and follow up care in outpatient services for Medication Management   Derrill [place], NP 03/03/2021, 4:31 PM

## 2021-03-06 ENCOUNTER — Other Ambulatory Visit (INDEPENDENT_AMBULATORY_CARE_PROVIDER_SITE_OTHER): Payer: Medicare Other

## 2021-03-06 ENCOUNTER — Other Ambulatory Visit: Payer: Self-pay

## 2021-03-06 ENCOUNTER — Other Ambulatory Visit: Payer: Medicare Other

## 2021-03-06 DIAGNOSIS — I10 Essential (primary) hypertension: Secondary | ICD-10-CM

## 2021-03-06 DIAGNOSIS — E785 Hyperlipidemia, unspecified: Secondary | ICD-10-CM | POA: Diagnosis not present

## 2021-03-06 LAB — CBC WITH DIFFERENTIAL/PLATELET
Basophils Absolute: 0 10*3/uL (ref 0.0–0.1)
Basophils Relative: 0.4 % (ref 0.0–3.0)
Eosinophils Absolute: 0.1 10*3/uL (ref 0.0–0.7)
Eosinophils Relative: 1.4 % (ref 0.0–5.0)
HCT: 39.5 % (ref 36.0–46.0)
Hemoglobin: 13.2 g/dL (ref 12.0–15.0)
Lymphocytes Relative: 19.4 % (ref 12.0–46.0)
Lymphs Abs: 1.9 10*3/uL (ref 0.7–4.0)
MCHC: 33.3 g/dL (ref 30.0–36.0)
MCV: 91.2 fl (ref 78.0–100.0)
Monocytes Absolute: 0.9 10*3/uL (ref 0.1–1.0)
Monocytes Relative: 9.6 % (ref 3.0–12.0)
Neutro Abs: 6.6 10*3/uL (ref 1.4–7.7)
Neutrophils Relative %: 69.2 % (ref 43.0–77.0)
Platelets: 277 10*3/uL (ref 150.0–400.0)
RBC: 4.33 Mil/uL (ref 3.87–5.11)
RDW: 13.4 % (ref 11.5–15.5)
WBC: 9.6 10*3/uL (ref 4.0–10.5)

## 2021-03-06 LAB — COMPREHENSIVE METABOLIC PANEL
ALT: 14 U/L (ref 0–35)
AST: 16 U/L (ref 0–37)
Albumin: 4.1 g/dL (ref 3.5–5.2)
Alkaline Phosphatase: 50 U/L (ref 39–117)
BUN: 20 mg/dL (ref 6–23)
CO2: 29 mEq/L (ref 19–32)
Calcium: 10.1 mg/dL (ref 8.4–10.5)
Chloride: 102 mEq/L (ref 96–112)
Creatinine, Ser: 0.83 mg/dL (ref 0.40–1.20)
GFR: 70.55 mL/min (ref >60.00)
Glucose, Bld: 114 mg/dL — ABNORMAL HIGH (ref 70–99)
Potassium: 4.2 mEq/L (ref 3.5–5.1)
Sodium: 138 mEq/L (ref 135–145)
Total Bilirubin: 0.9 mg/dL (ref 0.2–1.2)
Total Protein: 7.6 g/dL (ref 6.0–8.3)

## 2021-03-06 LAB — TSH: TSH: 3.03 u[IU]/mL (ref 0.35–5.50)

## 2021-03-08 ENCOUNTER — Encounter: Payer: Self-pay | Admitting: Family Medicine

## 2021-03-16 ENCOUNTER — Other Ambulatory Visit: Payer: Self-pay | Admitting: Family Medicine

## 2021-04-01 NOTE — Progress Notes (Incomplete)
Phone [phone]   Subjective:  Patient presents today for their annual physical. Chief complaint-noted.   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   Required high dose PPI, now on prilosec     GERD (gastroesophageal reflux disease)    Glaucoma    Hyperlipidemia    Hypertension    Snoring 03/08/2008   Tested 2007 per patient-unrevealing for sleep apnea     Patient Active Problem List   Diagnosis Date Noted   Osteoarthritis of ankle and foot 12/08/2018   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   Normocytic anemia 09/12/2014   CKD (chronic kidney disease), stage III ([place]) 03/14/2014   Glaucoma 03/08/2014   Insomnia 03/08/2014   Stress incontinence, female 10/12/2011   Vaginal atrophy 05/27/2011   Hyperglycemia 12/09/2009   Obesity 10/11/2008   GERD- failed h2 blocker 08/25/2007   Hyperlipidemia 10/13/2006   Major depression, recurrent, full remission ([place]) 10/13/2006   Hypertension, essential 10/10/2006   Past Surgical History:  Procedure Laterality Date   CATARACT EXTRACTION     late 2019   COLONOSCOPY     COLPOSCOPY     age 20   GYNECOLOGIC CRYOSURGERY     age 7   HAMMER TOE SURGERY     TUBAL LIGATION      Family History  Problem Relation Age of Onset   Diabetes Sister    Hypertension Sister    Alzheimer's disease Mother    Hip fracture Mother    Osteoporosis Mother    Deep vein thrombosis Father    Heart disease Father        MI 73s, nonsmoker   Hypertension Brother    Diabetes Maternal Grandmother    Stroke Maternal Grandfather    Depression Paternal Grandmother    Suicidality Paternal Grandfather    Depression Paternal Grandfather    Breast cancer Sister    Arthritis Sister     Colon cancer Neg Hx     Medications- reviewed and updated Current Outpatient Medications  Medication Sig Dispense Refill   amLODipine-valsartan (EXFORGE) 5-320 MG tablet TAKE 1 TABLET BY MOUTH EVERY DAY 90 tablet 3   escitalopram (LEXAPRO) 10 MG tablet Take 1 tablet (10 mg total) by mouth daily. 90 tablet 3   fluticasone (FLONASE) 50 MCG/ACT nasal spray SPRAY 2 SPRAYS INTO EACH NOSTRIL EVERY DAY 48 mL 1   latanoprost (XALATAN) 0.005 % ophthalmic solution 1 drop at bedtime.     LORazepam (ATIVAN) 0.5 MG tablet Take 1 tablet (0.5 mg total) by mouth at bedtime as needed for anxiety (Do not drive for 8 hours after use.  Do not take on a day you take Ambien.). 30 tablet 0   Multiple Vitamin (MULTIVITAMIN) tablet Take 1 tablet by mouth daily.     omeprazole (PRILOSEC) 20 MG capsule Take 20 mg by mouth daily.     rosuvastatin (CRESTOR) 5 MG tablet Take 1 tablet (5 mg total) by mouth 3 (three) times a week. 39 tablet 3   timolol (TIMOPTIC) 0.5 % ophthalmic solution Place 1 drop into both eyes 2 (two) times daily.  0   zolpidem (AMBIEN) 5 MG tablet TAKE 1 TABLET BY MOUTH EVERYDAY AT BEDTIME 30 tablet 5   No current  facility-administered medications for this visit.    Allergies-reviewed and updated Allergies  Allergen Reactions   Amlodipine Besy-Benazepril Hcl Other (See Comments)    Edema to ankle with redness/ but now she is tolerating the current regiment    Social History   Social History Narrative   Married (husband patient at [facility]). 3 children. 7 grandkids.       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 Gen: NAD, resting comfortably HEENT: Mucous membranes are moist. Oropharynx normal Neck: no thyromegaly CV: RRR no murmurs rubs or gallops Lungs: CTAB no crackles, wheeze, rhonchi Abdomen:  soft/nontender/nondistended/normal bowel sounds. No rebound or guarding.  Ext: no edema Skin: warm, dry Neuro: grossly normal, moves all extremities, PERRLA***   Assessment and Plan   71 y.o. female presenting for annual physical.  Health Maintenance counseling: 1. Anticipatory guidance: Patient counseled regarding regular dental exams ***q6 months, eye exams -yearly***,  avoiding smoking and second hand smoke*** , limiting alcohol to 1 beverage per day-well under that .  *** .  No illicit drugs - *** 2. Risk factor reduction:  Advised patient of need for regular exercise and diet rich and fruits and vegetables to reduce risk of heart attack and stroke.  Exercise- [employer] doing ellpitcal and bicycle 3x a week***.  Diet-has been trying to drink more water. Cutting down on doritos/chips/***.  Wt Readings from Last 3 Encounters:  02/24/21 176 lb 3.2 oz (79.9 kg)  02/22/21 175 lb (79.4 kg)  10/09/20 175 lb 9.6 oz (79.7 kg)   3. Immunizations/screenings/ancillary studies DISCUSSED:  -Shingrix vaccination - *** Immunization History  Administered Date(s) Administered   Fluad Quad(high Dose 65+) 11/10/2020   Influenza,inj,Quad PF,6+ Mos 11/15/2012, 10/30/2015, 10/12/2019   Influenza-Unspecified 09/25/2013, 11/10/2014, 10/29/2016   PFIZER(Purple Top)SARS-COV-2 Vaccination 02/15/2019, 03/08/2019, 11/09/2019   Pfizer Covid-19 Vaccine Bivalent Booster 70yr & up 11/10/2020   Pneumococcal Conjugate-13 03/08/2014   Pneumococcal Polysaccharide-23 03/13/2015   Td 01/26/2003   Tdap 10/16/2015   Zoster, Live 12/12/2009   Health Maintenance Due  Topic Date Due   Zoster Vaccines- Shingrix (1 of 2) Never done   4. Cervical cancer screening- Dr. RMarvel Plandid one last year- past formal age based screening requirements*** 5. Breast cancer screening-  breast exam with GYN *** and mammogram 06/05/20 with a 1 year repeat planned 6. Colon cancer screening - 08/09/12 with a 10 year repeat  planned*** 7. Skin cancer screening-  [place]***advised regular sunscreen use. Denies worrisome, changing, or new skin lesions.  8. Birth control/STD check-  monogamous and post menopause*** 9. Osteoporosis screening at 690 DEXA 05/31/19*** -never smoker - ***  Status of chronic or acute concerns   10/22/19 awv *** 04/04/20 cpe ***  #Hypertension/CKD stage III S: Compliant with amlodipine-valsartan 5-320 mg. Knows to avoid NSAIDs-Celebrex prescribed by Dr. HMilinda Pointerin past is no longer on Valsartan is protective in case there was a proteinuric element. Home readings #s: *** BP Readings from Last 3 Encounters:  02/24/21 132/82  02/22/21 (!) 142/62  10/09/20 132/82  A/P: ***  #Hyperlipidemia S: Poorly controlled on red yeast rice alone in past- later on rosuvastatin 5 mg twice a week starting 10/18/19. Myalgias on pravastatin in the past. Lab Results  Component Value Date   CHOL 192 10/09/2020   HDL  56.00 10/09/2020   LDLCALC 121 (H) 10/09/2020   LDLDIRECT 132.0 04/04/2020   TRIG 76.0 10/09/2020   CHOLHDL 3 10/09/2020   A/P: ***  #Hyperglycemia S: Increased risk of diabetes. No current medication. Lifestyle modification only. Exercise and diet- *** Lab Results  Component Value Date   HGBA1C 6.2 10/09/2020   HGBA1C 6.1 04/04/2020   HGBA1C 6.0 (H) 10/12/2019    A/P: ***  #GERD S: Had been well controlled on Prilosec 20 mg. zantac was not helpful for her.  B12 levels related to PPI use: Lab Results  Component Value Date   VITAMINB12 695 04/04/2020   A/P: ***   #Depression in full remission S: Compliant with Lexapro 5 mg. PHQ 9 of***  A/P:  Recommended follow up: No follow-ups on file. Future Appointments  Date Time Provider [place]  04/23/2021  9:40 AM Marin Olp, MD [facility]  06/22/2021  4:15 PM [facility] PHARMACIST [facility]  11/06/2021  8:45 AM [facility] HEALTH COACH [facility]    No chief complaint on file.  Lab/Order  associations:*** fasting No diagnosis found.  No orders of the defined types were placed in this encounter.  I,Jada Bradford,acting as a scribe for Garret Reddish, MD.,have documented all relevant documentation on the behalf of Garret Reddish, MD,as directed by  Garret Reddish, MD while in the presence of Garret Reddish, MD.  ***  Return precautions advised.  Burnett Corrente

## 2021-04-23 ENCOUNTER — Ambulatory Visit (INDEPENDENT_AMBULATORY_CARE_PROVIDER_SITE_OTHER): Payer: Medicare Other | Admitting: Family Medicine

## 2021-04-23 ENCOUNTER — Encounter: Payer: Self-pay | Admitting: Family Medicine

## 2021-04-23 VITALS — BP 148/84 | HR 60 | Temp 97.0°F | Ht 61.0 in | Wt 168.4 lb

## 2021-04-23 DIAGNOSIS — R739 Hyperglycemia, unspecified: Secondary | ICD-10-CM

## 2021-04-23 DIAGNOSIS — F331 Major depressive disorder, recurrent, moderate: Secondary | ICD-10-CM

## 2021-04-23 DIAGNOSIS — I10 Essential (primary) hypertension: Secondary | ICD-10-CM | POA: Diagnosis not present

## 2021-04-23 DIAGNOSIS — E785 Hyperlipidemia, unspecified: Secondary | ICD-10-CM | POA: Diagnosis not present

## 2021-04-23 DIAGNOSIS — N183 Chronic kidney disease, stage 3 unspecified: Secondary | ICD-10-CM

## 2021-04-23 DIAGNOSIS — Z79899 Other long term (current) drug therapy: Secondary | ICD-10-CM

## 2021-04-23 DIAGNOSIS — K219 Gastro-esophageal reflux disease without esophagitis: Secondary | ICD-10-CM

## 2021-04-23 DIAGNOSIS — Z0000 Encounter for general adult medical examination without abnormal findings: Secondary | ICD-10-CM | POA: Diagnosis not present

## 2021-04-23 DIAGNOSIS — R3911 Hesitancy of micturition: Secondary | ICD-10-CM

## 2021-04-23 LAB — CBC WITH DIFFERENTIAL/PLATELET
Basophils Absolute: 0 10*3/uL (ref 0.0–0.1)
Basophils Relative: 0.4 % (ref 0.0–3.0)
Eosinophils Absolute: 0.2 10*3/uL (ref 0.0–0.7)
Eosinophils Relative: 1.6 % (ref 0.0–5.0)
HCT: 41.3 % (ref 36.0–46.0)
Hemoglobin: 13.8 g/dL (ref 12.0–15.0)
Lymphocytes Relative: 15.6 % (ref 12.0–46.0)
Lymphs Abs: 1.5 10*3/uL (ref 0.7–4.0)
MCHC: 33.4 g/dL (ref 30.0–36.0)
MCV: 93 fl (ref 78.0–100.0)
Monocytes Absolute: 0.8 10*3/uL (ref 0.1–1.0)
Monocytes Relative: 8.1 % (ref 3.0–12.0)
Neutro Abs: 7.1 10*3/uL (ref 1.4–7.7)
Neutrophils Relative %: 74.3 % (ref 43.0–77.0)
Platelets: 189 10*3/uL (ref 150.0–400.0)
RBC: 4.44 Mil/uL (ref 3.87–5.11)
RDW: 13 % (ref 11.5–15.5)
WBC: 9.6 10*3/uL (ref 4.0–10.5)

## 2021-04-23 LAB — COMPREHENSIVE METABOLIC PANEL
ALT: 24 U/L (ref 0–35)
AST: 23 U/L (ref 0–37)
Albumin: 4.5 g/dL (ref 3.5–5.2)
Alkaline Phosphatase: 60 U/L (ref 39–117)
BUN: 16 mg/dL (ref 6–23)
CO2: 26 mEq/L (ref 19–32)
Calcium: 10.3 mg/dL (ref 8.4–10.5)
Chloride: 102 mEq/L (ref 96–112)
Creatinine, Ser: 0.81 mg/dL (ref 0.40–1.20)
GFR: 72.58 mL/min (ref >60.00)
Glucose, Bld: 116 mg/dL — ABNORMAL HIGH (ref 70–99)
Potassium: 4 mEq/L (ref 3.5–5.1)
Sodium: 137 mEq/L (ref 135–145)
Total Bilirubin: 0.7 mg/dL (ref 0.2–1.2)
Total Protein: 7.8 g/dL (ref 6.0–8.3)

## 2021-04-23 LAB — TSH: TSH: 3.02 u[IU]/mL (ref 0.35–5.50)

## 2021-04-23 LAB — HEMOGLOBIN A1C: Hgb A1c MFr Bld: 6 % (ref 4.6–6.5)

## 2021-04-23 LAB — VITAMIN B12: Vitamin B-12: 674 pg/mL (ref 211–911)

## 2021-04-23 NOTE — Patient Instructions (Addendum)
blood pressure is mildly elevated today-goal at home would be less than 135/85-please check once a day or so and update me in 1 week via MyChart or can drop off a copy of your readings. Continue current medicine for now ? ?Please stop by lab before you go ?If you have mychart- we will send your results within 3 business days of [place] receiving them.  ?If you do not have mychart- we will call you about results within 5 business days of [place] receiving them.  ?*please also note that you will see labs on mychart as soon as they post. I will later go in and write notes on them- will say "notes from Dr. Yong Channel"  ? ?Recommended follow up: Return in about 3 months (around 07/24/2021) for followup or sooner if needed.Schedule b4 you leave. ?

## 2021-04-29 ENCOUNTER — Encounter: Payer: Self-pay | Admitting: Family Medicine

## 2021-05-05 ENCOUNTER — Telehealth: Payer: Self-pay | Admitting: Family Medicine

## 2021-05-05 MED ORDER — AMLODIPINE BESYLATE-VALSARTAN 5-320 MG PO TABS: 1.0000 | ORAL_TABLET | Freq: Every day | ORAL | 3 refills | Status: DC

## 2021-05-05 NOTE — Telephone Encounter (Signed)
Rx refilled.

## 2021-05-05 NOTE — Telephone Encounter (Signed)
.. ?  Encourage patient to contact the pharmacy for refills or they can request refills through [facility] ? ?LAST APPOINTMENT DATE:  04/23/21 ? ?NEXT APPOINTMENT DATE: none ? ?MEDICATION: amlodepine, valsartan  ? ?Is the patient out of medication? No  ? ?[facility]  ? ?Let patient know to contact pharmacy at the end of the day to make sure medication is ready. ? ?Please notify patient to allow 48-72 hours to process  ?

## 2021-05-21 ENCOUNTER — Encounter: Payer: Self-pay | Admitting: Family Medicine

## 2021-05-23 ENCOUNTER — Encounter: Payer: Self-pay | Admitting: Family Medicine

## 2021-05-25 ENCOUNTER — Telehealth: Payer: Self-pay | Admitting: Family Medicine

## 2021-05-25 NOTE — Telephone Encounter (Signed)
Spouse wanted patient to be evaluated due to having slurred speech two to three weeks ago and is also having trouble sleeping.    Spouse is concerned this could have been a stroke.  States informed NP at [place] office and was informed to give our office a call.    Patient and spouse are going out of town next week.  I have scheduled patient to be seen by Dr. Jerline Pain.

## 2021-05-25 NOTE — Telephone Encounter (Signed)
Responded to pt message via mychart, pt has been sent to scheduling also per pt request. ?

## 2021-05-25 NOTE — Telephone Encounter (Signed)
Pt's spouse is asking for a return to call to discuss a few things.  ?

## 2021-05-26 ENCOUNTER — Encounter: Payer: Self-pay | Admitting: Family Medicine

## 2021-05-26 ENCOUNTER — Ambulatory Visit: Payer: Medicare Other | Admitting: Family Medicine

## 2021-05-26 ENCOUNTER — Ambulatory Visit (INDEPENDENT_AMBULATORY_CARE_PROVIDER_SITE_OTHER): Payer: Medicare Other

## 2021-05-26 VITALS — BP 139/82 | HR 76 | Temp 97.9°F | Ht 61.0 in | Wt 157.6 lb

## 2021-05-26 DIAGNOSIS — G4700 Insomnia, unspecified: Secondary | ICD-10-CM | POA: Diagnosis not present

## 2021-05-26 DIAGNOSIS — G459 Transient cerebral ischemic attack, unspecified: Secondary | ICD-10-CM | POA: Diagnosis not present

## 2021-05-26 DIAGNOSIS — F3342 Major depressive disorder, recurrent, in full remission: Secondary | ICD-10-CM | POA: Diagnosis not present

## 2021-05-26 DIAGNOSIS — Z23 Encounter for immunization: Secondary | ICD-10-CM

## 2021-05-26 DIAGNOSIS — E785 Hyperlipidemia, unspecified: Secondary | ICD-10-CM

## 2021-05-26 DIAGNOSIS — I10 Essential (primary) hypertension: Secondary | ICD-10-CM

## 2021-05-26 LAB — LIPID PANEL
Cholesterol: 185 mg/dL (ref 0–200)
HDL: 52.2 mg/dL (ref >39.00)
LDL Cholesterol: 113 mg/dL — ABNORMAL HIGH (ref 0–99)
NonHDL: 133.25
Total CHOL/HDL Ratio: 4
Triglycerides: 100 mg/dL (ref 0.0–149.0)
VLDL: 20 mg/dL (ref 0.0–40.0)

## 2021-05-26 NOTE — Progress Notes (Unsigned)
Enrolled patient for a 3 day Zio XT monitor to be mailed to patients home  

## 2021-05-26 NOTE — Patient Instructions (Signed)
It was very nice to see you today! ? ?I am concerned that you may have had a TIA.  We will need to do blood work and check a few scans to assess your risk for having a major stroke in the future. ?Take care, ?Dr Jerline Pain ? ?PLEASE NOTE: ? ?If you had any lab tests please let us know if you have not heard back within a few days. You may see your results on mychart before we have a chance to review them but we will give you a call once they are reviewed by [place]. If we ordered any referrals today, please let us know if you have not heard from their office within the next week.  ? ?Please try these tips to maintain a healthy lifestyle: ? ?Eat at least 3 REAL meals and 1-2 snacks per day.  Aim for no more than 5 hours between eating.  If you eat breakfast, please do so within one hour of getting up.  ? ?Each meal should contain half fruits/vegetables, one quarter protein, and one quarter carbs (no bigger than a computer mouse) ? ?Cut down on sweet beverages. This includes juice, soda, and sweet tea.  ? ?Drink at least 1 glass of water with each meal and aim for at least 8 glasses per day ? ?Exercise at least 150 minutes every week.   ?

## 2021-05-26 NOTE — Progress Notes (Signed)
? ?  Ann Washington is a 71 y.o. female who presents today for an office visit. ? ?Assessment/Plan:  ?New/Acute Problems: ?TIA ?Her to brief episodes of aphasia likely due to TIA.  It is possible that could be related to her underlying depression/anxiety however given her risk factors including dyslipidemia, prediabetes, and hypertension we will need to complete a TIA work-up.  Her neurologic exam today is reassuring without any focal deficits.  We will check MRI brain, MRA, Holter monitor, echocardiogram, and update her lipid panel.  We discussed strict reasons to return to care and seek emergent care.  They will need to follow-up with PCP soon. ? ?Chronic Problems Addressed Today: ?HTN -well-controlled on current regimen of amlodipine-valsartan-5-3 20 once daily ?Prediabetes - Last A1c 6.0 a month ago.  Not currently on any medications ?Dyslipidemia - Last LDL 121 6 months ago.  We will repeat lipid panel today.  She is currently on Crestor 5 mg daily though will likely need to increase intensity of statin depending on results of above work-up. ?Insomnia/depression/anxiety-currently on Lexapro 10 mg daily and trazodone 50 mg nightly.  She is doing well with this current regimen.  Continue management per psychiatry. ? ?  ?Subjective:  ?HPI: ? ?Patient here with her husband today with concern for an episode of aphasia a few weeks ago.  She was in her normal state of health with her husband when he noticed that she was not able to form words or complete sentences.  Her words were not making sense.  This lasted for 2 to 3 minutes and then spontaneously subsided.  She had a similar episode shortly afterwards that was witnessed by her daughter that also lasted for 2 to 3 minutes and then spontaneously subsided.  She has not had any symptoms since then.  No weakness or numbness.  No vision changes.  No headache.  No difficulties with speaking or swallowing since then.  She does have a history of anxiety and depression  and is currently following with a psychiatrist for this.  They were initially concerned that it may have been a medication side effect or perhaps due to her underlying mental health issues however the practitioner at her psychiatrist office recommended she get neurologic work-up.  She is doing well today.  No neurologic symptoms. ? ?   ?  ?Objective:  ?Physical Exam: ?BP 139/82 (BP Location: Right Arm)   Pulse 76   Temp 97.9 ?F (36.6 ?C) (Temporal)   Ht '5\' 1"'$  (1.549 m)   Wt 157 lb 9.6 oz (71.5 kg)   SpO2 93%   BMI 29.78 kg/m?   ?Gen: No acute distress, resting comfortably ?CV: Regular rate and rhythm with no murmurs appreciated ?Pulm: Normal work of breathing, clear to auscultation bilaterally with no crackles, wheezes, or rhonchi ?Neuro: Cranial nerves II through XII intact.  Finger-nose-finger testing intact bilaterally.  Strength out of 5 in upper and lower extremities.  Reflexes 2+ and symmetric in bilateral upper and lower extremities.  Sensation to light touch intact throughout. ?Psych: Normal affect and thought content ? ?Time Spent: ?45 minutes of total time was spent on the date of the encounter performing the following actions: chart review prior to seeing the patient including recent MyChart messages with PCP and previous office visit, obtaining history, performing a medically necessary exam, counseling on the treatment plan, placing orders, and documenting in our EHR.  ? ? ?   ? ?Algis Greenhouse. Jerline Pain, MD ?05/26/2021 11:51 AM  ?

## 2021-05-28 NOTE — Progress Notes (Signed)
Please inform patient of the following: ? ?Her cholesterol level is borderline but still above where they should ideally be.  Recommend increasing her Crestor to 10 mg daily and following up with her PCP in 3-6 months to recheck her LDL. ? ?Algis Greenhouse. Jerline Pain, MD ?05/28/2021 12:28 PM  ?

## 2021-05-29 DIAGNOSIS — G459 Transient cerebral ischemic attack, unspecified: Secondary | ICD-10-CM | POA: Diagnosis not present

## 2021-06-01 ENCOUNTER — Other Ambulatory Visit: Payer: Self-pay

## 2021-06-01 MED ORDER — ROSUVASTATIN CALCIUM 10 MG PO TABS: 10.0000 mg | ORAL_TABLET | Freq: Every day | ORAL | 3 refills | Status: DC

## 2021-06-02 ENCOUNTER — Telehealth: Payer: Self-pay

## 2021-06-02 NOTE — Telephone Encounter (Signed)
Please advise 

## 2021-06-02 NOTE — Telephone Encounter (Signed)
Patient is scheduled for 5/16 with [place].   Spouse is refusing ED for patient.  However understood Dr. Ronney Lion response.    Lattie Haw, please work on scheduling.

## 2021-06-02 NOTE — Telephone Encounter (Signed)
We need to get MRI done-if she has significantly worsening symptoms seek care in the emergency room bmet travel.  Team please try to get her scheduled with [place] in the next week or so ?

## 2021-06-02 NOTE — Telephone Encounter (Signed)
Patient's spouse is calling in stating he and his wife are traveling back from [place].  States they have not heard anything yet in regard to MRI scheduling  (I am including Lattie Haw on this message).  States he feels patient is more disoriented now than she was when she came in to see Dr. Jerline Pain on 5/2.  States he lost patient last night at hotel.  States he found her outside walking around.    Please follow back up in regard with spouse at [phone].

## 2021-06-03 ENCOUNTER — Other Ambulatory Visit: Payer: Self-pay

## 2021-06-03 DIAGNOSIS — G459 Transient cerebral ischemic attack, unspecified: Secondary | ICD-10-CM

## 2021-06-03 NOTE — Telephone Encounter (Signed)
Please see note below. 

## 2021-06-04 ENCOUNTER — Other Ambulatory Visit: Payer: Self-pay | Admitting: Family Medicine

## 2021-06-05 ENCOUNTER — Ambulatory Visit (INDEPENDENT_AMBULATORY_CARE_PROVIDER_SITE_OTHER): Payer: Medicare Other

## 2021-06-05 DIAGNOSIS — G459 Transient cerebral ischemic attack, unspecified: Secondary | ICD-10-CM | POA: Diagnosis not present

## 2021-06-05 LAB — ECHOCARDIOGRAM COMPLETE
AR max vel: 4.13 cm2
AV Area VTI: 4.2 cm2
AV Area mean vel: 3.99 cm2
AV Mean grad: 3 mmHg
AV Peak grad: 5.8 mmHg
AV Vena cont: 0.93 cm
Ao pk vel: 1.21 m/s
Area-P 1/2: 3.11 cm2
Calc EF: 67.7 %
P 1/2 time: 494 msec
S' Lateral: 2.57 cm
Single Plane A2C EF: 72.5 %
Single Plane A4C EF: 63.3 %

## 2021-06-08 ENCOUNTER — Ambulatory Visit: Payer: Medicare Other | Admitting: Family Medicine

## 2021-06-08 ENCOUNTER — Encounter: Payer: Self-pay | Admitting: Family Medicine

## 2021-06-08 VITALS — BP 128/78 | HR 96 | Temp 98.1°F | Ht 61.0 in | Wt 157.2 lb

## 2021-06-08 DIAGNOSIS — R739 Hyperglycemia, unspecified: Secondary | ICD-10-CM

## 2021-06-08 DIAGNOSIS — E785 Hyperlipidemia, unspecified: Secondary | ICD-10-CM | POA: Diagnosis not present

## 2021-06-08 DIAGNOSIS — I10 Essential (primary) hypertension: Secondary | ICD-10-CM | POA: Diagnosis not present

## 2021-06-08 DIAGNOSIS — F3342 Major depressive disorder, recurrent, in full remission: Secondary | ICD-10-CM

## 2021-06-08 NOTE — Progress Notes (Signed)
?Phone [phone] ?In person visit ?  ?Subjective:  ? ?Ann Washington is a 71 y.o. year old very pleasant female patient who presents for/with See problem oriented charting ?Chief Complaint  ?Patient presents with  ? Follow-up  ? Hypertension  ? Hyperlipidemia  ? ? ?Past Medical History-  ?Patient Active Problem List  ? Diagnosis Date Noted  ? CKD (chronic kidney disease), stage III ([place]) 03/14/2014  ?  Priority: Medium   ? Glaucoma 03/08/2014  ?  Priority: Medium   ? Insomnia 03/08/2014  ?  Priority: Medium   ? Hyperglycemia 12/09/2009  ?  Priority: Medium   ? Hyperlipidemia 10/13/2006  ?  Priority: Medium   ? Major depression, recurrent, full remission ([place]) 10/13/2006  ?  Priority: Medium   ? Hypertension, essential 10/10/2006  ?  Priority: Medium   ? Normocytic anemia 09/12/2014  ?  Priority: Low  ? Stress incontinence, female 10/12/2011  ?  Priority: Low  ? Vaginal atrophy 05/27/2011  ?  Priority: Low  ? Obesity 10/11/2008  ?  Priority: Low  ? GERD- failed h2 blocker 08/25/2007  ?  Priority: Low  ? Osteoarthritis of ankle and foot 12/08/2018  ? 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 ? ?Medications- reviewed and updated ?Current Outpatient Medications  ?Medication Sig Dispense Refill  ? amLODipine-valsartan (EXFORGE) 5-320 MG tablet Take 1 tablet by mouth daily. 90 tablet 3  ? escitalopram (LEXAPRO) 10 MG tablet Take 1 tablet (10 mg total) by mouth daily. 90 tablet 3  ? fluticasone (FLONASE) 50 MCG/ACT nasal spray SPRAY 2 SPRAYS INTO EACH NOSTRIL EVERY DAY 48 mL 1  ? latanoprost (XALATAN) 0.005 % ophthalmic solution 1 drop at bedtime.    ? Multiple Vitamin (MULTIVITAMIN) tablet Take 1 tablet by mouth daily.    ? omeprazole (PRILOSEC) 20 MG capsule Take 20 mg by mouth daily.    ? rosuvastatin (CRESTOR) 10 MG tablet Take 1 tablet (10 mg total) by mouth daily. 90 tablet 3  ? timolol (TIMOPTIC) 0.5 % ophthalmic solution Place 1 drop into both eyes 2 (two) times daily.  0  ? traZODone (DESYREL) 50 MG tablet Take 50-100  mg by mouth at bedtime as needed.    ? ?No current facility-administered medications for this visit.  ? ?  ?Objective:  ?BP 128/78   Pulse 96   Temp 98.1 ?F (36.7 ?C)   Ht '5\' 1"'$  (1.549 m)   Wt 157 lb 3.2 oz (71.3 kg)   SpO2 97%   BMI 29.70 kg/m?  ?Gen: NAD, resting comfortably ?CV: RRR no murmurs rubs or gallops ?Lungs: CTAB no crackles, wheeze, rhonchi ?Ext: no edema ?Skin: warm, dry ?Neuro: grossly normal, moves all extremities, shorter answers than usual ? ?  ? ?Assessment and Plan  ? ?#Hypertension/CKD stage III ?S: Compliant with amlodipine-valsartan 5-320 mg.   ?- Knows to avoid NSAIDs-Celebrex prescribed by Dr. Milinda Pointer in past is no longer on  Valsartan is protective in case there is a proteinuric element. Renal function appeared to have actually improved some ?BP Readings from Last 3 Encounters:  ?06/08/21 128/78  ?05/26/21 139/82  ?04/23/21 (!) 148/84  ? A/P: HTN Controlled. Continue current medications.  ? ?CKD III- actually improved more recently- will follow next set of labs  ? ?#Hyperlipidemia ?#Concern for TIA-was seen by Dr. Jerline Pain on 05/26/2021 after a period of aphasia-MRI brain, MRA, Holter monitor, echocardiogram was ordered- largely reassuring 06/05/21-moderate asymettric LVH and grade I diastolic dysfunction,  aortic valve sclerosis noted otherwise reassuring - she  is scheduled on 06/16/21 for MRI, cardiac monitoring- pending results ?S: Poorly controlled on red yeast rice alone in past- later on rosuvastatin 5 mg twice a week starting 10/18/19-now up to 3 times a week.  Myalgias on pravastatin in the past. ? ?Dr. Jerline Pain at visit on 05/26/2021 recommended increasing rosuvastatin to 10 mg daily ? ?Harder time making decisions after this event and seems to scrub face harder. Less emotion.  ? ?Husband wonders if memory loss in the fall led her to stop her depression medications. Husbands mother had that for 10 years.  ?Lab Results  ?Component Value Date  ? CHOL 185 05/26/2021  ? HDL 52.20 05/26/2021   ? LDLCALC 113 (H) 05/26/2021  ? LDLDIRECT 132.0 04/04/2020  ? TRIG 100.0 05/26/2021  ? CHOLHDL 4 05/26/2021  ?A/P: possible TIA- also some concern for vascular related memory changes. We will await mri/mra, cardiac monitoring before we decide on next steps  ?-strongly considering aspirin vs NOAC depending on if any a fib ?- consider MMSE in the future ? ? #Hyperglycemia ?S: Increased risk of diabetes.  No current medication.  Lifestyle modification only.  ?Lab Results  ?Component Value Date  ? HGBA1C 6.0 04/23/2021  ? HGBA1C 6.2 10/09/2020  ? HGBA1C 6.1 04/04/2020  ?A/P: actually slightly improved- continue to monitor  ? ? #GERD ?S: Has been well controlled on Prilosec. zantac was not helpful for her.  ?A/P: ideally with memory changes and possible TIA would not be on PPI but at this point she needs this.   ? ?#Depression in full remission ?S: Compliant with Lexapro 10 mg and trazodone 50 mg through psychiatry ?-Did have behavioral health urgent care visit on 03/03/2021 ?-seeing [facility] Karie Georges, NP.  ? ?  04/23/2021  ?  9:27 AM 02/24/2021  ?  4:10 PM 10/24/2020  ?  8:54 AM  ?Depression screen PHQ 2/9  ?Decreased Interest 0 3 0  ?Down, Depressed, Hopeless 0 3 0  ?PHQ - 2 Score 0 6 0  ?Altered sleeping 0 3   ?Tired, decreased energy 3 3   ?Change in appetite 0 3   ?Feeling bad or failure about yourself  0 0   ?Trouble concentrating 0 0   ?Moving slowly or fidgety/restless 0 0   ?Suicidal thoughts 0 0   ?PHQ-9 Score 3 15   ?Difficult doing work/chores Not difficult at all Somewhat difficult   ?A/P: much improved/full remission- did have to decrease lexapro to 10 mg from 15 mg recently with psychiatry- will continue current meds. Did have an episode of wandering on higher dose.  ? ?Recommended follow up: Return in about 2 months (around 08/08/2021) for followup or sooner if needed.Schedule b4 you leave. ?Future Appointments  ?Date Time Provider [place]  ?06/16/2021  2:00 PM [facility] MR 2 [facility]  [facility]  ?06/16/2021  2:40 PM [facility] MR 2 [facility]  ?11/06/2021  8:45 AM [facility]  ? ?Lab/Order associations: ?  ICD-10-CM   ?1. Major depression, recurrent, full remission ([place])  F33.42   ?  ?2. Hypertension, essential  I10   ?  ?3. Hyperlipidemia, unspecified hyperlipidemia type  E78.5   ?  ?4. Hyperglycemia  R73.9   ?  ? ? ?No orders of the defined types were placed in this encounter. ? ? ?Return precautions advised.  ?Garret Reddish, MD ? ?

## 2021-06-08 NOTE — Patient Instructions (Addendum)
No changes today- awaiting other results first ? ?Recommended follow up: Return in about 2 months (around 08/08/2021) for followup or sooner if needed.Schedule b4 you leave. ?

## 2021-06-09 ENCOUNTER — Ambulatory Visit: Payer: Medicare Other | Admitting: Family Medicine

## 2021-06-09 NOTE — Progress Notes (Signed)
Please inform patient of the following: ? ?Her echocardiogram shows normal squeezing function.  She does have a few age-related changes including impaired relaxation and a couple very mild leaky valves. ? ?There was one area if thickening of her heart.  This does not seem to be anything urgent though we should probably have a cardiologist take a look.  Please place referral for her to see cardiology.

## 2021-06-10 ENCOUNTER — Telehealth: Payer: Self-pay

## 2021-06-10 NOTE — Progress Notes (Signed)
Please inform patient of the following: ? ?Good news!  Her heart monitor did not show any significant arrhythmias.

## 2021-06-10 NOTE — Telephone Encounter (Signed)
Patient states she missed a call from [place].   Please give patient a call back in regard.

## 2021-06-15 ENCOUNTER — Telehealth: Payer: Self-pay

## 2021-06-15 DIAGNOSIS — I517 Cardiomegaly: Secondary | ICD-10-CM

## 2021-06-15 NOTE — Telephone Encounter (Signed)
Patients spouse has called in.  I have given him responses to Long Term monitor and Echo.    Spouse understood.  I do not see referral to cardiology.  Please enter referral.   I have given spouse cardiology's number and have advised to call their office to schedule, if he has not heard anything from them in the next two weeks.

## 2021-06-15 NOTE — Telephone Encounter (Signed)
See Dr. Marigene Ehlers notes on echocardiogram report

## 2021-06-15 NOTE — Telephone Encounter (Signed)
Was pt supposed to be referred to Cardiology? I don't see anything in the last OV note about referring.

## 2021-06-16 ENCOUNTER — Ambulatory Visit
Admission: RE | Admit: 2021-06-16 | Discharge: 2021-06-16 | Disposition: A | Discharge status: Home / Self Care | Payer: Medicare Other | Source: Ambulatory Visit | Attending: Family Medicine | Admitting: Family Medicine

## 2021-06-16 DIAGNOSIS — G459 Transient cerebral ischemic attack, unspecified: Secondary | ICD-10-CM

## 2021-06-16 NOTE — Telephone Encounter (Signed)
It looks like you contacted pt, can you see what this referral should be associated under for cardiology?

## 2021-06-16 NOTE — Telephone Encounter (Signed)
She needs to see cardiology for asymmetric left ventricular hypertrophy that we saw on her echocardiogram.  Algis Greenhouse. Jerline Pain, MD 06/16/2021 9:37 AM

## 2021-06-17 NOTE — Telephone Encounter (Signed)
Referral placed.

## 2021-06-17 NOTE — Telephone Encounter (Signed)
Patient husband Adaleena Mooers calling back regarding missed call he had from yesterday from [place] and wondering if this was in reference to recent scans. Cardiology appt has been made just as an [place]. Please contact pt husband directly at [phone].

## 2021-06-17 NOTE — Telephone Encounter (Signed)
Keba please see DR. Parker's note and you an order under my name- I am not sure when you are referencing me contacting patient recently on quick view (can your reference if you want me to follow up on that portion?)  but this order should be adequate I believe

## 2021-06-17 NOTE — Telephone Encounter (Signed)
Husband returned call - Asked to please call [phone] -

## 2021-06-18 NOTE — Progress Notes (Signed)
Please inform patient of the following:  Her MRI shows a few age-related changes however no blockages or signs of previous stroke.  Do not need to do any further testing at this point.  She should continue her medications as prescribed.  She should follow-up with her PCP soon for ongoing risk factor management but we do not need to do anything else at this point.

## 2021-06-22 ENCOUNTER — Telehealth: Payer: Medicare Other

## 2021-07-08 ENCOUNTER — Ambulatory Visit: Payer: Medicare Other | Admitting: Interventional Cardiology

## 2021-07-08 VITALS — BP 134/78 | HR 65 | Ht 61.0 in | Wt 151.0 lb

## 2021-07-08 DIAGNOSIS — R7303 Prediabetes: Secondary | ICD-10-CM | POA: Diagnosis not present

## 2021-07-08 DIAGNOSIS — I517 Cardiomegaly: Secondary | ICD-10-CM | POA: Diagnosis not present

## 2021-07-08 DIAGNOSIS — E782 Mixed hyperlipidemia: Secondary | ICD-10-CM | POA: Diagnosis not present

## 2021-07-08 DIAGNOSIS — I10 Essential (primary) hypertension: Secondary | ICD-10-CM

## 2021-07-08 NOTE — Progress Notes (Signed)
Cardiology Office Note   Date:  07/08/2021   ID:  Ann Washington, DOB 21-Mar-1950, MRN [id number]  PCP:  Ann Olp, MD    No chief complaint on file.  LVH  Wt Readings from Last 3 Encounters:  07/08/21 151 lb (68.5 kg)  06/08/21 157 lb 3.2 oz (71.3 kg)  05/26/21 157 lb 9.6 oz (71.5 kg)       History of Present Illness: Ann Washington is a 71 y.o. female who is being seen today for the evaluation of LVH at the request of Ann Olp, MD.   ECho showed: "Left ventricular ejection fraction, by estimation, is 60 to 65%. The  left ventricle has normal function. The left ventricle has no regional  wall motion abnormalities. There is moderate asymmetric left ventricular  hypertrophy of the basal-septal  segment (15 mm). Left ventricular diastolic parameters are consistent with  Grade I diastolic dysfunction (impaired relaxation).   2. Right ventricular systolic function is normal. The right ventricular  size is normal. Tricuspid regurgitation signal is inadequate for assessing  PA pressure.   3. The mitral valve is grossly normal. Trivial mitral valve  regurgitation. No evidence of mitral stenosis.   4. The aortic valve is grossly normal. There is mild calcification of the  aortic valve. Aortic valve regurgitation is mild. Aortic valve sclerosis  is present, with no evidence of aortic valve stenosis.   5. The inferior vena cava is normal in size with greater than 50%  respiratory variability, suggesting right atrial pressure of 3 mmHg.   6. Mildly dilated coronary sinus. "  Ankle pain limits exercise. Had a fall on a trail.   Monitor 5/23: "Normal sinus rhythm with rare PACs and PVCs. Bradycardia noted during sleep. No atrial fibrillation. No significant pauses. No pathologic arrhythmias. No patient symptoms noted.  Denies : Chest pain. Dizziness. Leg edema. Nitroglycerin use. Orthopnea. Palpitations. Paroxysmal nocturnal dyspnea. Shortness of breath.  Syncope.    TOlerated Crestor QOD.   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 90 tablet 3   escitalopram (LEXAPRO) 10 MG tablet Take 1 tablet (10 mg total) by mouth daily. 90 tablet 3   fluticasone (FLONASE) 50 MCG/ACT nasal spray SPRAY 2 SPRAYS INTO EACH NOSTRIL EVERY DAY 48 mL 1   latanoprost (XALATAN) 0.005 % ophthalmic solution 1 drop at bedtime.     Multiple Vitamin (MULTIVITAMIN) tablet Take 1 tablet by mouth daily.     omeprazole (PRILOSEC) 20 MG capsule Take 20 mg by mouth daily.     rosuvastatin (CRESTOR) 10 MG tablet Take 1 tablet (10 mg total) by  mouth daily. 90 tablet 3   timolol (TIMOPTIC) 0.5 % ophthalmic solution Place 1 drop into both eyes 2 (two) times daily.  0   traZODone (DESYREL) 50 MG tablet Take 50-100 mg by mouth at bedtime as needed for sleep. Patient takes a quarter to a half a tab daily per spouse     No current facility-administered medications for this visit.    Allergies:   Amlodipine besy-benazepril hcl    Social History:  The patient  reports that she has never smoked. She has never used smokeless tobacco. She reports that she does not drink alcohol and does not use drugs.   Family History:  The patient's family history includes Alzheimer's disease in her mother;  Arthritis in her sister; Breast cancer in her sister; Deep vein thrombosis in her father; Depression in her paternal grandfather and paternal grandmother; Diabetes in her maternal grandmother and sister; Heart disease in her father; Hip fracture in her mother; Hypertension in her brother and sister; Osteoporosis in her mother; Stroke in her maternal grandfather; Suicidality in her paternal grandfather.    ROS:  Please see the history of present illness.   Otherwise, review of systems are positive for ankle pain.   All other systems are reviewed and negative.    PHYSICAL EXAM: VS:  BP 134/78   Pulse 65   Ht '5\' 1"'$  (1.549 m)   Wt 151 lb (68.5 kg)   SpO2 97%   BMI 28.53 kg/m  , BMI Body mass index is 28.53 kg/m. GEN: Well nourished, well developed, in no acute distress HEENT: normal Neck: no JVD, carotid bruits, or masses Cardiac: RRR; no murmurs, rubs, or gallops,no edema  Respiratory:  clear to auscultation bilaterally, normal work of breathing GI: soft, nontender, nondistended, + BS MS: no deformity or atrophy Skin: warm and dry, no rash Neuro:  Strength and sensation are intact Psych: euthymic mood, full affect   EKG:   The ekg ordered today demonstrates NSR, no ST changes   Recent Labs: 04/23/2021: ALT 24; BUN 16; Creatinine, Ser 0.81; Hemoglobin 13.8; Platelets 189.0; Potassium 4.0; Sodium 137; TSH 3.02   Lipid Panel    Component Value Date/Time   CHOL 185 05/26/2021 1119   TRIG 100.0 05/26/2021 1119   HDL 52.20 05/26/2021 1119   CHOLHDL 4 05/26/2021 1119   VLDL 20.0 05/26/2021 1119   LDLCALC 113 (H) 05/26/2021 1119   LDLCALC 135 (H) 10/12/2019 0951   LDLDIRECT 132.0 04/04/2020 0847     Other studies Reviewed: Additional studies/ records that were reviewed today with results demonstrating: labs reviewed.   ASSESSMENT AND PLAN:  LVH: Echo reviewed.  Mild, basal septal hypertrophy.  No symptoms of obstruction.  No symptoms of heart failure.  Continue healthy  lifestyle.  She needs to increase exercise.  We spoke about healthy diet.  No LVH noted on ECG. PreDM: Whole food, plant-based diet.  Avoid processed foods. HTN: Minimize salt in diet.  Continue Exforge.  ARB should help minimize LVH. Hyperlipidemia: Titrating Crestor with primary care doctor.   Current medicines are reviewed at length with the patient today.  The patient concerns regarding her medicines were addressed.  The following changes have been made:  No change  Labs/ tests ordered today include:  No orders of the defined types were placed in this encounter.   Recommend 150 minutes/week of aerobic exercise Low fat, low carb, high fiber diet recommended  Disposition:   FU as needed   Signed, Larae Grooms, MD  07/08/2021 10:03 AM    [facility] [address], [place], [place]  [address] Phone: [phone]; Fax: [phone]

## 2021-07-08 NOTE — Patient Instructions (Signed)
Medication Instructions:  Your physician recommends that you continue on your current medications as directed. Please refer to the Current Medication list given to you today.  *If you need a refill on your cardiac medications before your next appointment, please call your pharmacy*   Lab Work: none If you have labs (blood work) drawn today and your tests are completely normal, you will receive your results only by: [place] (if you have MyChart) OR A paper copy in the mail If you have any lab test that is abnormal or we need to change your treatment, we will call you to review the results.   Testing/Procedures: none   Follow-Up: At [facility], you and your health needs are our priority.  As part of our continuing mission to provide you with exceptional heart care, we have created designated Provider Care Teams.  These Care Teams include your primary Cardiologist (physician) and Advanced Practice Providers (APPs -  Physician Assistants and Nurse Practitioners) who all work together to provide you with the care you need, when you need it.  We recommend signing up for the patient portal called "MyChart".  Sign up information is provided on this After Visit Summary.  MyChart is used to connect with patients for Virtual Visits (Telemedicine).  Patients are able to view lab/test results, encounter notes, upcoming appointments, etc.  Non-urgent messages can be sent to your provider as well.   To learn more about what you can do with MyChart, go to [email or web address].    Your next appointment:   As needed  The format for your next appointment:   In Person  Provider:   Larae Grooms, MD     Other Instructions  High-Fiber Eating Plan Fiber, also called dietary fiber, is a type of carbohydrate. It is found foods such as fruits, vegetables, whole grains, and beans. A high-fiber diet can have many health benefits. Your health care provider may recommend a high-fiber diet to  help: Prevent constipation. Fiber can make your bowel movements more regular. Lower your cholesterol. Relieve the following conditions: Inflammation of veins in the anus (hemorrhoids). Inflammation of specific areas of the digestive tract (uncomplicated diverticulosis). A problem of the large intestine, also called the colon, that sometimes causes pain and diarrhea (irritable bowel syndrome, or IBS). Prevent overeating as part of a weight-loss plan. Prevent heart disease, type 2 diabetes, and certain cancers. What are tips for following this plan? Reading food labels  Check the nutrition facts label on food products for the amount of dietary fiber. Choose foods that have 5 grams of fiber or more per serving. The goals for recommended daily fiber intake include: Men (age 70 or younger): 34-38 g. Men (over age 92): 28-34 g. Women (age 20 or younger): 25-28 g. Women (over age 4): 22-25 g. Your daily fiber goal is _____________ g. Shopping Choose whole fruits and vegetables instead of processed forms, such as apple juice or applesauce. Choose a wide variety of high-fiber foods such as avocados, lentils, oats, and kidney beans. Read the nutrition facts label of the foods you choose. Be aware of foods with added fiber. These foods often have high sugar and sodium amounts per serving. Cooking Use whole-grain flour for baking and cooking. Cook with brown rice instead of white rice. Meal planning Start the day with a breakfast that is high in fiber, such as a cereal that contains 5 g of fiber or more per serving. Eat breads and cereals that are made with whole-grain flour instead of  refined flour or white flour. Eat brown rice, bulgur wheat, or millet instead of white rice. Use beans in place of meat in soups, salads, and pasta dishes. Be sure that half of the grains you eat each day are whole grains. General information You can get the recommended daily intake of dietary fiber by: Eating a  variety of fruits, vegetables, grains, nuts, and beans. Taking a fiber supplement if you are not able to take in enough fiber in your diet. It is better to get fiber through food than from a supplement. Gradually increase how much fiber you consume. If you increase your intake of dietary fiber too quickly, you may have bloating, cramping, or gas. Drink plenty of water to help you digest fiber. Choose high-fiber snacks, such as berries, raw vegetables, nuts, and popcorn. What foods should I eat? Fruits Berries. Pears. Apples. Oranges. Avocado. Prunes and raisins. Dried figs. Vegetables Sweet potatoes. Spinach. Kale. Artichokes. Cabbage. Broccoli. Cauliflower. Green peas. Carrots. Squash. Grains Whole-grain breads. Multigrain cereal. Oats and oatmeal. Brown rice. Barley. Bulgur wheat. [place]. Quinoa. Bran muffins. Popcorn. Rye wafer crackers. Meats and other proteins Navy beans, kidney beans, and pinto beans. Soybeans. Split peas. Lentils. Nuts and seeds. Dairy Fiber-fortified yogurt. Beverages Fiber-fortified soy milk. Fiber-fortified orange juice. Other foods Fiber bars. The items listed above may not be a complete list of recommended foods and beverages. Contact a dietitian for more information. What foods should I avoid? Fruits Fruit juice. Cooked, strained fruit. Vegetables Fried potatoes. Canned vegetables. Well-cooked vegetables. Grains White bread. Pasta made with refined flour. White rice. Meats and other proteins Fatty cuts of meat. Fried chicken or fried fish. Dairy Milk. Yogurt. Cream cheese. Sour cream. Fats and oils Butters. Beverages Soft drinks. Other foods Cakes and pastries. The items listed above may not be a complete list of foods and beverages to avoid. Talk with your dietitian about what choices are best for you. Summary Fiber is a type of carbohydrate. It is found in foods such as fruits, vegetables, whole grains, and beans. A high-fiber diet has many  benefits. It can help to prevent constipation, lower blood cholesterol, aid weight loss, and reduce your risk of heart disease, diabetes, and certain cancers. Increase your intake of fiber gradually. Increasing fiber too quickly may cause cramping, bloating, and gas. Drink plenty of water while you increase the amount of fiber you consume. The best sources of fiber include whole fruits and vegetables, whole grains, nuts, seeds, and beans. This information is not intended to replace advice given to you by your health care provider. Make sure you discuss any questions you have with your health care provider. Document Revised: 05/17/2019 Document Reviewed: 05/17/2019 Elsevier Patient Education  [place]

## 2021-07-19 ENCOUNTER — Encounter: Payer: Self-pay | Admitting: Family Medicine

## 2021-08-12 ENCOUNTER — Ambulatory Visit: Payer: Medicare Other | Admitting: Family Medicine

## 2021-08-12 ENCOUNTER — Encounter: Payer: Self-pay | Admitting: Family Medicine

## 2021-08-12 VITALS — BP 120/72 | HR 77 | Temp 98.2°F | Ht 61.0 in | Wt 146.8 lb

## 2021-08-12 DIAGNOSIS — R634 Abnormal weight loss: Secondary | ICD-10-CM

## 2021-08-12 DIAGNOSIS — I10 Essential (primary) hypertension: Secondary | ICD-10-CM | POA: Diagnosis not present

## 2021-08-12 DIAGNOSIS — R300 Dysuria: Secondary | ICD-10-CM

## 2021-08-12 DIAGNOSIS — R739 Hyperglycemia, unspecified: Secondary | ICD-10-CM | POA: Diagnosis not present

## 2021-08-12 DIAGNOSIS — E782 Mixed hyperlipidemia: Secondary | ICD-10-CM

## 2021-08-12 LAB — COMPREHENSIVE METABOLIC PANEL
ALT: 12 U/L (ref 0–35)
AST: 17 U/L (ref 0–37)
Albumin: 4.5 g/dL (ref 3.5–5.2)
Alkaline Phosphatase: 61 U/L (ref 39–117)
BUN: 26 mg/dL — ABNORMAL HIGH (ref 6–23)
CO2: 24 mEq/L (ref 19–32)
Calcium: 9.9 mg/dL (ref 8.4–10.5)
Chloride: 101 mEq/L (ref 96–112)
Creatinine, Ser: 0.86 mg/dL (ref 0.40–1.20)
GFR: 67.4 mL/min (ref >60.00)
Glucose, Bld: 112 mg/dL — ABNORMAL HIGH (ref 70–99)
Potassium: 3.8 mEq/L (ref 3.5–5.1)
Sodium: 135 mEq/L (ref 135–145)
Total Bilirubin: 0.8 mg/dL (ref 0.2–1.2)
Total Protein: 7.8 g/dL (ref 6.0–8.3)

## 2021-08-12 LAB — CBC WITH DIFFERENTIAL/PLATELET
Basophils Absolute: 0 10*3/uL (ref 0.0–0.1)
Basophils Relative: 0.5 % (ref 0.0–3.0)
Eosinophils Absolute: 0.1 10*3/uL (ref 0.0–0.7)
Eosinophils Relative: 1.4 % (ref 0.0–5.0)
HCT: 40.5 % (ref 36.0–46.0)
Hemoglobin: 13.7 g/dL (ref 12.0–15.0)
Lymphocytes Relative: 20.8 % (ref 12.0–46.0)
Lymphs Abs: 1.8 10*3/uL (ref 0.7–4.0)
MCHC: 33.9 g/dL (ref 30.0–36.0)
MCV: 93 fl (ref 78.0–100.0)
Monocytes Absolute: 0.8 10*3/uL (ref 0.1–1.0)
Monocytes Relative: 8.8 % (ref 3.0–12.0)
Neutro Abs: 5.9 10*3/uL (ref 1.4–7.7)
Neutrophils Relative %: 68.5 % (ref 43.0–77.0)
Platelets: 232 10*3/uL (ref 150.0–400.0)
RBC: 4.35 Mil/uL (ref 3.87–5.11)
RDW: 13 % (ref 11.5–15.5)
WBC: 8.7 10*3/uL (ref 4.0–10.5)

## 2021-08-12 LAB — POC URINALSYSI DIPSTICK (AUTOMATED)
Bilirubin, UA: NEGATIVE
Blood, UA: NEGATIVE
Glucose, UA: NEGATIVE
Ketones, UA: NEGATIVE
Nitrite, UA: NEGATIVE
Protein, UA: NEGATIVE
Spec Grav, UA: 1.02 (ref 1.010–1.025)
Urobilinogen, UA: 0.2 E.U./dL
pH, UA: 5.5 (ref 5.0–8.0)

## 2021-08-12 LAB — C-REACTIVE PROTEIN: CRP: <1 mg/dL (ref 0.5–20.0)

## 2021-08-12 LAB — TSH: TSH: 3.4 u[IU]/mL (ref 0.35–5.50)

## 2021-08-12 LAB — SEDIMENTATION RATE: Sed Rate: 35 mm/hr — ABNORMAL HIGH (ref 0–30)

## 2021-08-12 NOTE — Patient Instructions (Addendum)
Pick up stool cards  Update mammogram please [place] Schedule an appointment by calling [phone].  3 meals a day recommended and snacks in between- want to avoid further weight loss- may need even further evaluation if continue to lose weight or if labs lead [place] to do more eval  Please stop by lab before you go If you have mychart- we will send your results within 3 business days of [place] receiving them.  If you do not have mychart- we will call you about results within 5 business days of [place] receiving them.  *please also note that you will see labs on mychart as soon as they post. I will later go in and write notes on them- will say "notes from Dr. Yong Channel"   Please go to [facility]  central X-ray (updated 03/22/2019) - located [address] across the street from [place] [facility] - in the basement - Hours: 8:30-5:00 PM M-F (with lunch from 12:30- 1 PM). You do NOT need an appointment.  - Please ensure you are covid symptom free before going in(No fever, chills, cough, congestion, runny nose, shortness of breath, fatigue, body aches, sore throat, headache, nausea, vomiting, diarrhea, or new loss of taste or smell. No known contacts with covid 19 or someone being tested for covid 19)  Recommended follow up: Return in about 1 month (around 09/12/2021) for followup or sooner if needed.Schedule b4 you leave.

## 2021-08-12 NOTE — Progress Notes (Signed)
Phone [phone] In person visit   Subjective:   Ann Washington is a 71 y.o. year old very pleasant female patient who presents for/with See problem oriented charting Chief Complaint  Patient presents with   Follow-up    Pt c/o lack of motivation.   Depression   Hypertension   Past Medical History-  Patient Active Problem List   Diagnosis Date Noted   CKD (chronic kidney disease), stage III ([place]) 03/14/2014    Priority: Medium    Glaucoma 03/08/2014    Priority: Medium    Insomnia 03/08/2014    Priority: Medium    Hyperglycemia 12/09/2009    Priority: Medium    Hyperlipidemia 10/13/2006    Priority: Medium    Major depression, recurrent, full remission ([place]) 10/13/2006    Priority: Medium    Hypertension, essential 10/10/2006    Priority: Medium    Normocytic anemia 09/12/2014    Priority: Low   Stress incontinence, female 10/12/2011    Priority: Low   Vaginal atrophy 05/27/2011    Priority: Low   Obesity 10/11/2008    Priority: Low   GERD- failed h2 blocker 08/25/2007    Priority: Low   Osteoarthritis of ankle and foot 12/08/2018   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 90 tablet 3   escitalopram (LEXAPRO) 10 MG tablet Take 1 tablet (10 mg total) by mouth daily. 90 tablet 3   fluticasone (FLONASE) 50 MCG/ACT nasal spray SPRAY 2 SPRAYS INTO EACH NOSTRIL EVERY DAY 48 mL 1   latanoprost (XALATAN) 0.005 % ophthalmic solution 1 drop at bedtime.     Multiple Vitamin (MULTIVITAMIN) tablet Take 1 tablet by mouth daily.     omeprazole (PRILOSEC) 20 MG capsule Take 20 mg by mouth daily.     rosuvastatin (CRESTOR) 10 MG tablet Take 1 tablet (10 mg total) by mouth daily. 90 tablet 3   timolol (TIMOPTIC) 0.5 % ophthalmic solution Place 1 drop into both eyes 2 (two) times daily.  0   traZODone (DESYREL)  50 MG tablet Take 50-100 mg by mouth at bedtime as needed for sleep. Patient takes a quarter to a half a tab daily per spouse     No current facility-administered medications for this visit.     Objective:  BP 120/72   Pulse 77   Temp 98.2 F (36.8 C)   Ht '5\' 1"'  (1.549 m)   Wt 146 lb 12.8 oz (66.6 kg)   SpO2 98%   BMI 27.74 kg/m  Gen: NAD, resting comfortably CV: RRR no murmurs rubs or gallops Lungs: CTAB no crackles, wheeze, rhonchi Abdomen: soft/nontender/nondistended/normal bowel sounds. No rebound or guarding.  Ext: no edema Skin: warm, dry Neuro: Looks to daughter for answers-change for her as a year ago would have likely been a visit by herself answering all questions    Assessment and Plan    #Hypertension/CKD stage III S: Compliant with amlodipine-valsartan 5-320 mg.   -Knows to avoid NSAIDs-Celebrex prescribed by Dr. Milinda Pointer in past is no longer on   BP Readings from Last 3 Encounters:  08/12/21 120/72  07/08/21 134/78  06/08/21 128/78  A/P: Hypertension well-controlled.  Continue current medication.  Update CMP to evaluate renal function  #Hyperlipidemia #concern for TIA 05/26/21- with aphasia- MRI, MRA, holter, echo largely reassuring other than LVH (cardiology saw) S: medication: rosuvastatin 10 mg daily -07/19/21  reported more fatigue and less pain from typical pains when starting this dose up from 15 mg a week- had opted to try off 2 weeks and she has been off for 3 weeks- daughter has noted stronger speech and some improvement in strength  -  Myalgias on pravastatin in the past. A/P: With concern for prior TIA-I want patient to be on statin but she did not tolerate recent trial although not 100% clear that it was actually related to the statin itself-I am okay with her remaining off statin short-term until follow-up in 1 month and further evaluation of unintentional weight loss - I am going to have team reach out and have her add aspirin 81 mg given prior possible  TIA   #Hyperglycemia S: Increased risk of diabetes.  No current medication.  Lifestyle modification only. Lab Results  Component Value Date   HGBA1C 6.0 04/23/2021   HGBA1C 6.2 10/09/2020   HGBA1C 6.1 04/04/2020  A/P: Unintentional weight loss noted but too soon for A1c repeat as under 6 months for prediabetes-continue to monitor unless has significantly increased CBG    #Depression  #Unintentional weight loss #Memory loss S: Compliant with Lexapro 10 mg and trazodone 50 mg prescribed by psychiatry  -Ambien in the past  -Did have behavioral health urgent care visit on 03/03/2021 -[facility] Karie Georges, NP.  - doesn't remember to eat-cognitive eval with neurology next week -Down 11 pounds in just about 2 months    08/12/2021   10:26 AM 04/23/2021    9:27 AM 02/24/2021    4:10 PM  Depression screen PHQ 2/9  Decreased Interest 1 0 3  Down, Depressed, Hopeless 1 0 3  PHQ - 2 Score 2 0 6  Altered sleeping 0 0 3  Tired, decreased energy '1 3 3  ' Change in appetite 0 0 3  Feeling bad or failure about yourself  0 0 0  Trouble concentrating 1 0 0  Moving slowly or fidgety/restless 0 0 0  Suicidal thoughts 0 0 0  PHQ-9 Score '4 3 15  ' Difficult doing work/chores  Not difficult at all Somewhat difficult  A/P: Depression appears reasonably controlled on Lexapro 10 mg and trazodone for sleep-follow-up with psychiatry - With that being said even with good control patient has had unintentional weight loss-we will initiate unintentional weight loss work-up but exclude HIV and hepatitis C testing after discussion with patient -update mamogram -Up-to-date on colonoscopy we will update stool cards  Possible this really could be related to memory loss and patient not remembering to eat-we will await neurology evaluation  Close follow-up next month for repeat weight check-discussed potentially advancing work-up with CT abdomen pelvis now if ESR/CRP elevated or at that time if  worsening  Recommended follow up: Return in about 1 month (around 09/12/2021) for followup or sooner if needed.Schedule b4 you leave. Future Appointments  Date Time Provider [place]  08/18/2021 11:30 AM Genia Harold, MD [facility] None  09/16/2021 10:20 AM Marin Olp, MD [facility]  11/06/2021  8:45 AM [facility] HEALTH COACH [facility]    Lab/Order associations:   ICD-10-CM   1. Mixed hyperlipidemia  E78.2 CBC with Differential/Platelet    Comprehensive metabolic panel    TSH    POCT Urinalysis Dipstick (Automated)    2. Hypertension, essential  I10 CBC with Differential/Platelet    Comprehensive metabolic panel    POCT Urinalysis Dipstick (Automated)    3. Hyperglycemia  R73.9     4. Unintentional weight  loss  R63.4 Fecal occult blood, imunochemical    DG Chest 2 View    Sedimentation rate    C-reactive protein      No orders of the defined types were placed in this encounter.   Return precautions advised.  Garret Reddish, MD

## 2021-08-13 ENCOUNTER — Other Ambulatory Visit: Payer: Self-pay | Admitting: Family Medicine

## 2021-08-13 ENCOUNTER — Other Ambulatory Visit: Payer: Self-pay | Admitting: *Deleted

## 2021-08-13 ENCOUNTER — Encounter: Payer: Self-pay | Admitting: *Deleted

## 2021-08-13 DIAGNOSIS — Z1231 Encounter for screening mammogram for malignant neoplasm of breast: Secondary | ICD-10-CM

## 2021-08-14 ENCOUNTER — Encounter: Payer: Self-pay | Admitting: Family Medicine

## 2021-08-14 ENCOUNTER — Ambulatory Visit (INDEPENDENT_AMBULATORY_CARE_PROVIDER_SITE_OTHER)
Admission: RE | Admit: 2021-08-14 | Discharge: 2021-08-14 | Disposition: A | Discharge status: Home / Self Care | Payer: Medicare Other | Source: Ambulatory Visit | Attending: Family Medicine | Admitting: Family Medicine

## 2021-08-14 ENCOUNTER — Other Ambulatory Visit: Payer: Self-pay

## 2021-08-14 DIAGNOSIS — R300 Dysuria: Secondary | ICD-10-CM

## 2021-08-14 DIAGNOSIS — R634 Abnormal weight loss: Secondary | ICD-10-CM | POA: Diagnosis not present

## 2021-08-14 MED ORDER — ASPIRIN 81 MG PO TBEC: 81.0000 mg | DELAYED_RELEASE_TABLET | Freq: Every day | ORAL | 12 refills | Status: AC

## 2021-08-14 NOTE — Telephone Encounter (Signed)
Patient would like [place] to reach out to Ann Washington her husband for lab, and xray results- patient signed a new DPR.

## 2021-08-14 NOTE — Telephone Encounter (Signed)
Called and spoke with Coralyn Mark and made aware for pt to take aspirin '81mg'$  daily and added to med list, also made aware xray and culture not back yet but will be visible via mychart once available.

## 2021-08-15 LAB — URINE CULTURE
MICRO NUMBER:: 13678882
SPECIMEN QUALITY:: ADEQUATE

## 2021-08-17 NOTE — Progress Notes (Unsigned)
[facility]  PATIENT: Ann Washington DOB: 14-Oct-1950  REFERRING CLINICIAN: Loletta Specter, NP HISTORY FROM: self, husband Coralyn Mark REASON FOR VISIT: memory loss   HISTORICAL  CHIEF COMPLAINT:  Chief Complaint  Patient presents with   Confusion    Rm 1, New Pt, husbandCoralyn Mark  MOCA 22    HISTORY OF PRESENT ILLNESS:  The patient presents for evaluation of memory loss which has been present for the past 5 months. She was diagnosed with severe depression requiring hospitalization 15 yers ago. She was started on Lexapro and Ambien at that time, and remained stable until last fall. At that time she stopped Lexapro as her mood had been stable. She then developed recurrence of depression and insomnia and was started on Trazodone 50 mg and Lexapro 10 mg. Since starting these medications she has developed episodes of confusion.  She and her husband went on a trip to [place] in April. At the time her husband found her outside the hotel room looking for children. She does not remember this well.  Had another episode where she could not speak for a few minutes. She did understand what people were saying to her, but felt she could not process her words.  Another time  she thought she had to go to her son's funeral, even though her son is still alive. Stated she had to buy flowers for her son's wife.  Sometimes her memory is very good and other times she can get very confused. She seems to have a slowed response time and she does feel like she is processing things more slowly.   Did have one fall 3 weeks ago when she was hiking and tripped over some stairs.  She just decreased her Lexapro and Trazodone dose. First decreased dose was last night.  Brain MRI/MRA on 06/16/21 showed mild chronic microvascular ischemic changes and was otherwise unremarkable.  TBI:  No past history of TBI Stroke:  no past history of stroke Seizures:  no past history of seizures Sleep:  Will sleep until 12 pm. Feels she has decreased energy and difficulty waking up in the morning. Typically sleeps well at night. Previously was diagnosed with mild OSA. She denies snoring now Mood: History of depression. Currently on Lexapro and Trazodone  Functional status:  Patient lives with husband. Daughter will sometimes watch her when husband is not present Cooking: Has some trouble with planning her meals and struggles to remember how to cook recipes Cleaning: Will misplace objects around the house Shopping: Will struggle to figure out what to buy at the store. Has come back with only a few items before Driving: hasn't driven since January 2023 because she doesn't feel safe driving Bills: Used to handle the finance, but husband has taken it over due to confusion Medications: Husband has been taking care of her medicine. Sometimes she does not want to take it Word finding difficulty? yes  OTHER MEDICAL CONDITIONS: HTN, GERD, CKD 3, HLD, depression, glaucoma   REVIEW OF SYSTEMS: Full 14 system review of systems performed and negative with exception of: memory loss, confusion  ALLERGIES: Allergies  Allergen Reactions   Amlodipine Besy-Benazepril Hcl Other (See Comments)    Edema to ankle with redness/ but now she is tolerating the current regiment    HOME MEDICATIONS: Outpatient Medications Prior to Visit  Medication Sig Dispense Refill   amLODipine-valsartan (EXFORGE) 5-320 MG tablet Take 1 tablet by mouth daily. 90 tablet 3   aspirin EC 81 MG tablet Take 1  tablet (81 mg total) by mouth daily. Swallow whole. 30 tablet 12   escitalopram (LEXAPRO) 5 MG tablet Take 5 mg by mouth daily.     fluticasone (FLONASE) 50 MCG/ACT nasal spray SPRAY 2 SPRAYS INTO EACH NOSTRIL EVERY DAY 48 mL 1   latanoprost (XALATAN) 0.005 % ophthalmic solution 1 drop at bedtime.     Multiple Vitamin (MULTIVITAMIN) tablet Take 1 tablet by mouth daily.     omeprazole (PRILOSEC) 20 MG capsule Take 20 mg by  mouth daily.     rosuvastatin (CRESTOR) 10 MG tablet Take 1 tablet (10 mg total) by mouth daily. 90 tablet 3   timolol (TIMOPTIC) 0.5 % ophthalmic solution Place 1 drop into both eyes 2 (two) times daily.  0   traZODone (DESYREL) 50 MG tablet Take 50-100 mg by mouth at bedtime as needed for sleep. Patient takes a quarter to a half a tab daily per spouse     buPROPion (WELLBUTRIN XL) 150 MG 24 hr tablet Take 150 mg by mouth every morning. (Patient not taking: Reported on 08/18/2021)     escitalopram (LEXAPRO) 10 MG tablet Take 1 tablet (10 mg total) by mouth daily. 90 tablet 3   No facility-administered medications prior to visit.    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   Required high dose PPI, now on prilosec     GAD (generalized anxiety disorder)    GERD (gastroesophageal reflux disease)    Glaucoma    Hyperlipidemia    Hypertension    Insomnia    MCI (mild cognitive impairment)    Snoring 03/08/2008   Tested 2007 per patient-unrevealing for sleep apnea      PAST SURGICAL HISTORY: Past Surgical History:  Procedure Laterality Date   CATARACT EXTRACTION     late 2019   COLONOSCOPY     COLPOSCOPY     age 65   GYNECOLOGIC CRYOSURGERY     age 67   HAMMER TOE SURGERY     TUBAL LIGATION      FAMILY HISTORY: Family History  Problem Relation Age of Onset   Diabetes Sister    Hypertension Sister    Alzheimer's disease Mother    Hip fracture Mother    Osteoporosis Mother    Deep vein thrombosis Father    Heart disease Father        MI 22s, nonsmoker   Hypertension Brother    Diabetes Maternal Grandmother    Stroke Maternal Grandfather    Depression Paternal Grandmother    Suicidality Paternal Grandfather    Depression Paternal Grandfather    Breast cancer Sister    Arthritis Sister    Colon cancer Neg Hx     SOCIAL  HISTORY: Social History   Socioeconomic History   Marital status: Married    Spouse name: Coralyn Mark   Number of children: 3   Years of education: Not on file   Highest education level: Some college, no degree  Occupational History   Not on file  Tobacco Use   Smoking status: Never   Smokeless tobacco: Never  Vaping Use   Vaping Use: Never used  Substance and Sexual Activity   Alcohol use: No   Drug use: No   Sexual activity: Yes    Birth control/protection: Surgical  Other Topics Concern   Not on file  Social  History Narrative   Married (husband patient at [facility]). 3 children. 7 grandkids.       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      Hobbies: time with grandkids, walk when weather nice, reading, tv   Social Determinants of Health   Financial Resource Strain: Low Risk  (10/24/2020)   Overall Financial Resource Strain (CARDIA)    Difficulty of Paying Living Expenses: Not hard at all  Food Insecurity: No Food Insecurity (10/24/2020)   Hunger Vital Sign    Worried About Running Out of Food in the Last Year: Never true    [place] in the Last Year: Never true  Transportation Needs: No Transportation Needs (10/24/2020)   PRAPARE - Hydrologist (Medical): No    Lack of Transportation (Non-Medical): No  Physical Activity: Sufficiently Active (10/24/2020)   Exercise Vital Sign    Days of Exercise per Week: 3 days    Minutes of Exercise per Session: 50 min  Stress: No Stress Concern Present (10/24/2020)   [place]    Feeling of Stress : Not at all  Social Connections: Moderately Integrated (10/24/2020)   Social Connection and Isolation Panel [NHANES]    Frequency of Communication with Friends and Family: More than three times a week    Frequency of Social Gatherings with Friends and Family: More than three times a week    Attends  Religious Services: More than 4 times per year    Active Member of [employer] or Organizations: No    Attends Archivist Meetings: Never    Marital Status: Married  Human resources officer Violence: Not At Risk (10/24/2020)   Humiliation, Afraid, Rape, and Kick questionnaire    Fear of Current or Ex-Partner: No    Emotionally Abused: No    Physically Abused: No    Sexually Abused: No     PHYSICAL EXAM  GENERAL EXAM/CONSTITUTIONAL: Vitals:  Vitals:   08/18/21 1128  BP: (!) 142/76  Pulse: (!) 58  Weight: 149 lb 6.4 oz (67.8 kg)  Height: '5\' 1"'$  (1.549 m)   Body mass index is 28.23 kg/m. Wt Readings from Last 3 Encounters:  08/18/21 149 lb 6.4 oz (67.8 kg)  08/12/21 146 lb 12.8 oz (66.6 kg)  07/08/21 151 lb (68.5 kg)   NEUROLOGIC: MENTAL STATUS:       08/18/2021   11:33 AM  Montreal Cognitive Assessment   Visuospatial/ Executive (0/5) 2  Naming (0/3) 3  Attention: Read list of digits (0/2) 2  Attention: Read list of letters (0/1) 1  Attention: Serial 7 subtraction starting at 100 (0/3) 3  Language: Repeat phrase (0/2) 0  Language : Fluency (0/1) 0  Abstraction (0/2) 2  Delayed Recall (0/5) 3  Orientation (0/6) 6  Total 22    CRANIAL NERVE:  2nd, 3rd, 4th, 6th - pupils equal and reactive to light, visual fields full to confrontation, extraocular muscles intact, no nystagmus 5th - facial sensation symmetric 7th - facial strength symmetric 8th - hearing intact 9th - palate elevates symmetrically, uvula midline 11th - shoulder shrug symmetric 12th - tongue protrusion midline  MOTOR:  normal bulk and tone, no cogwheeling, full strength in the BUE, BLE  SENSORY:  normal and symmetric to light touch all 4 extremities  COORDINATION:  finger-nose-finger, fine finger movements normal, no tremor  REFLEXES:  deep tendon reflexes present and symmetric  GAIT/STATION:  Normal based gait   DIAGNOSTIC DATA (LABS, IMAGING, TESTING) - I reviewed patient records,  labs, notes, testing and imaging myself where available.  Lab Results  Component Value Date   WBC 8.7 08/12/2021   HGB 13.7 08/12/2021   HCT 40.5 08/12/2021   MCV 93.0 08/12/2021   PLT 232.0 08/12/2021      Component Value Date/Time   NA 135 08/12/2021 1111   K 3.8 08/12/2021 1111   CL 101 08/12/2021 1111   CO2 24 08/12/2021 1111   GLUCOSE 112 (H) 08/12/2021 1111   BUN 26 (H) 08/12/2021 1111   CREATININE 0.86 08/12/2021 1111   CREATININE 0.78 10/12/2019 0951   CALCIUM 9.9 08/12/2021 1111   PROT 7.8 08/12/2021 1111   ALBUMIN 4.5 08/12/2021 1111   AST 17 08/12/2021 1111   ALT 12 08/12/2021 1111   ALKPHOS 61 08/12/2021 1111   BILITOT 0.8 08/12/2021 1111   GFRNONAA >60 02/22/2021 1450   GFRNONAA 77 10/12/2019 0951   GFRAA 89 10/12/2019 0951   Lab Results  Component Value Date   CHOL 185 05/26/2021   HDL 52.20 05/26/2021   LDLCALC 113 (H) 05/26/2021   LDLDIRECT 132.0 04/04/2020   TRIG 100.0 05/26/2021   CHOLHDL 4 05/26/2021   Lab Results  Component Value Date   HGBA1C 6.0 04/23/2021   Lab Results  Component Value Date   [id number] 740 04/23/2021   Lab Results  Component Value Date   TSH 3.40 08/12/2021     ASSESSMENT AND PLAN  71 y.o. year old female with a history of HTN, GERD, CKD 3, HLD, depression, glaucoma who presents for evaluation of memory loss and confusion over the past 5 months. MRI/MRA brain was unremarkable. MOCA today is 22, which is consistent with mild cognitive impairment. Suspect her new medications may be contributing to her confusion as her symptom onset correlates with initiation of Lexapro and trazodone. Will see if confusion improves with her recently decreased dose. EEG ordered to assess for subclinical seizures as she is having fluctuating confusion with improvement in cognition between episodes. Referral for neuropsychological testing placed to help characterize her memory deficits and assess for underlying neurodegenerative  disorder.   1. Mild cognitive impairment   2. Altered mental status, unspecified altered mental status type       PLAN: - Routine EEG - Referral for neuropsychological testing - Will monitor to see if symptoms improve with decreased doses of psychiatric medications  Orders Placed This Encounter  Procedures   Ambulatory referral to Neuropsychology   EEG adult    No orders of the defined types were placed in this encounter.   Return in about 6 months (around 02/18/2022).  I spent an average of 41 minutes chart reviewing and counseling the patient, with at least 50% of the time face to face with the patient.    Genia Harold, MD 08/18/21 12:27 PM  [facility] [address], [place], [place] [address] [phone]

## 2021-08-18 ENCOUNTER — Ambulatory Visit (INDEPENDENT_AMBULATORY_CARE_PROVIDER_SITE_OTHER): Payer: Medicare Other | Admitting: Psychiatry

## 2021-08-18 ENCOUNTER — Encounter: Payer: Self-pay | Admitting: Psychiatry

## 2021-08-18 ENCOUNTER — Other Ambulatory Visit (INDEPENDENT_AMBULATORY_CARE_PROVIDER_SITE_OTHER): Payer: Medicare Other

## 2021-08-18 VITALS — BP 142/76 | HR 58 | Ht 61.0 in | Wt 149.4 lb

## 2021-08-18 DIAGNOSIS — G3184 Mild cognitive impairment, so stated: Secondary | ICD-10-CM | POA: Diagnosis not present

## 2021-08-18 DIAGNOSIS — R634 Abnormal weight loss: Secondary | ICD-10-CM

## 2021-08-18 DIAGNOSIS — R4182 Altered mental status, unspecified: Secondary | ICD-10-CM

## 2021-08-18 LAB — FECAL OCCULT BLOOD, IMMUNOCHEMICAL: Fecal Occult Bld: NEGATIVE

## 2021-08-18 NOTE — Patient Instructions (Signed)
Plan: -EEG -Referral for neuropsychological testing

## 2021-08-20 ENCOUNTER — Telehealth: Payer: Self-pay | Admitting: Psychiatry

## 2021-08-20 NOTE — Telephone Encounter (Signed)
Referral for Neuropsychology sent to [facility] [phone]. 

## 2021-08-25 ENCOUNTER — Telehealth: Payer: Self-pay | Admitting: Psychiatry

## 2021-08-25 NOTE — Telephone Encounter (Signed)
LVM and sent mychart msg asking pt if she meant for EEG to be cancelled by another office. If she calls back, she can be rescheduled to next available EEG.

## 2021-08-27 ENCOUNTER — Other Ambulatory Visit: Payer: Medicare Other | Admitting: *Deleted

## 2021-08-31 ENCOUNTER — Ambulatory Visit: Payer: Medicare Other

## 2021-09-04 DIAGNOSIS — Z1231 Encounter for screening mammogram for malignant neoplasm of breast: Secondary | ICD-10-CM

## 2021-09-07 ENCOUNTER — Ambulatory Visit: Payer: Medicare Other | Admitting: Neurology

## 2021-09-07 DIAGNOSIS — R4182 Altered mental status, unspecified: Secondary | ICD-10-CM

## 2021-09-07 NOTE — Procedures (Signed)
    History:  71 year old woman with altered mental status   EEG classification: Awake and drowsy  Description of the recording: The background rhythms of this recording consists of a fairly well modulated medium amplitude alpha rhythm of 10 Hz that is reactive to eye opening and closure. As the record progresses, the patient appears to remain in the waking state throughout the recording. Photic stimulation was performed, did not show any abnormalities. Hyperventilation was also performed, did not show any abnormalities. Toward the end of the recording, the patient enters the drowsy state with slight symmetric slowing seen. The patient never enters stage II sleep. No abnormal epileptiform discharges seen during this recording. There was no focal slowing. EKG monitor shows no evidence of cardiac rhythm abnormalities with a heart rate of 60.  Abnormality: None   Impression: This is a normal EEG recording in the waking and drowsy state. No evidence of interictal epileptiform discharges seen. A normal EEG does not exclude a diagnosis of epilepsy.    Alric Ran, MD [facility]

## 2021-09-11 ENCOUNTER — Ambulatory Visit: Payer: Medicare Other

## 2021-09-16 ENCOUNTER — Ambulatory Visit: Payer: Medicare Other | Admitting: Family Medicine

## 2021-09-16 ENCOUNTER — Encounter: Payer: Self-pay | Admitting: Family Medicine

## 2021-09-16 VITALS — BP 138/84 | HR 57 | Temp 97.7°F | Ht 61.0 in | Wt 147.4 lb

## 2021-09-16 DIAGNOSIS — F3342 Major depressive disorder, recurrent, in full remission: Secondary | ICD-10-CM

## 2021-09-16 DIAGNOSIS — E782 Mixed hyperlipidemia: Secondary | ICD-10-CM | POA: Diagnosis not present

## 2021-09-16 DIAGNOSIS — I10 Essential (primary) hypertension: Secondary | ICD-10-CM | POA: Diagnosis not present

## 2021-09-16 NOTE — Patient Instructions (Addendum)
Flu shot- we should have these available within a month or two but please let us know if you get at outside pharmacy  No changes today- leaving aspirin on list - even if you take once a week likely some benefit  Recommended follow up: Return in about 3 months (around 12/17/2021) for followup or sooner if needed.Schedule b4 you leave.  -also schedule 6 month physical

## 2021-09-16 NOTE — Progress Notes (Signed)
Phone [phone] In person visit   Subjective:   Ann Washington is a 71 y.o. year old very pleasant female patient who presents for/with See problem oriented charting Chief Complaint  Patient presents with   Follow-up   Hyperlipidemia   Depression   Hypertension    Past Medical History-  Patient Active Problem List   Diagnosis Date Noted   CKD (chronic kidney disease), stage III ([place]) 03/14/2014    Priority: Medium    Glaucoma 03/08/2014    Priority: Medium    Insomnia 03/08/2014    Priority: Medium    Hyperglycemia 12/09/2009    Priority: Medium    Hyperlipidemia 10/13/2006    Priority: Medium    Major depression, recurrent, full remission ([place]) 10/13/2006    Priority: Medium    Hypertension, essential 10/10/2006    Priority: Medium    Normocytic anemia 09/12/2014    Priority: Low   Stress incontinence, female 10/12/2011    Priority: Low   Vaginal atrophy 05/27/2011    Priority: Low   Obesity 10/11/2008    Priority: Low   GERD- failed h2 blocker 08/25/2007    Priority: Low   Osteoarthritis of ankle and foot 12/08/2018   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 90 tablet 3   aspirin EC 81 MG tablet Take 1 tablet (81 mg total) by mouth daily. Swallow whole. 30 tablet 12   buPROPion (WELLBUTRIN XL) 150 MG 24 hr tablet Take 150 mg by mouth every morning.     escitalopram (LEXAPRO) 5 MG tablet Take 5 mg by mouth daily.     fluticasone (FLONASE) 50 MCG/ACT nasal spray SPRAY 2 SPRAYS INTO EACH NOSTRIL EVERY DAY 48 mL 1   latanoprost (XALATAN) 0.005 % ophthalmic solution 1 drop at bedtime.     Multiple Vitamin (MULTIVITAMIN) tablet Take 1 tablet by mouth daily.     omeprazole (PRILOSEC) 20 MG capsule Take 20 mg by mouth daily.     timolol (TIMOPTIC) 0.5 % ophthalmic solution Place 1 drop into both  eyes 2 (two) times daily.  0   traZODone (DESYREL) 50 MG tablet Take 50-100 mg by mouth at bedtime as needed for sleep. Patient takes a quarter to a half a tab daily per spouse     No current facility-administered medications for this visit.     Objective:  BP 138/84   Pulse (!) 57   Temp 97.7 F (36.5 C)   Ht '5\' 1"'$  (1.549 m)   Wt 147 lb 6.4 oz (66.9 kg)   SpO2 98%   BMI 27.85 kg/m  Gen: NAD, resting comfortably CV: RRR  (low normal HR) no murmurs rubs or gallops Lungs: CTAB no crackles, wheeze, rhonchi Ext: trace edema Skin: warm, dry    Assessment and Plan   #Hypertension S: Compliant with amlodipine-valsartan 5-320 mg -high 120s/mid 60s at home  BP Readings from Last 3 Encounters:  09/16/21 138/84  08/18/21 (!) 142/76  08/12/21 120/72  A/P: Controlled. Continue current medications.  Even better at home   #Hyperlipidemia #concern for TIA 05/26/21- with aphasia- MRI, MRA, holter, echo largely reassuring other than LVH (cardiology saw) S: medication: Trouble with statins despite multiple attempts, aspirin 81 mg daily (prefers not to take) -not on rosuvastatin 10 mg- had worsening fatigue when went to daily-along with myalgias I believe-seemed to have some symptoms even on  15 mg a week -  Myalgias on pravastatin in the past. A/P: Patient with hyperlipidemia which is likely poorly controlled off medicine and has not tolerated prior statin trials even with pulse dosing once a week.  She would like to remain off statin at this time-aware this increases risk for stroke.  Also worried about bleeding risk on aspirin-discussed I was more concerned about stroke risk but she would like to hold off on taking daily-she is going to consider at least taking every few days or once a week at least  #Depression  #Prior unintentional weight loss S: Compliant with Lexapro 10 mg--> 5 mg in last month and trazodone 50 mg (not working quite as well) prescribed by psychiatry . Feels less foggy on  the 5 mg dose.  -Ambien in the past  -Did have behavioral health urgent care visit on 03/03/2021 -seeing [facility] Kar[facility] Georg[facility], NP.   -daughter and husband have reported eating better- was forgetful previously  -also had neurology visit and EEG (reassuring) and has pending neuropsychological eval.     09/16/2021   10:39 AM 08/12/2021   10:26 AM 04/23/2021    9:27 AM  Depression screen PHQ 2/9  Decreased Interest 0 1 0  Down, Depressed, Hopeless 0 1 0  PHQ - 2 Score 0 2 0  Altered sleeping 1 0 0  Tired, decreased energy '2 1 3  '$ Change in appetite 0 0 0  Feeling bad or failure about yourself  0 0 0  Trouble concentrating 0 1 0  Moving slowly or fidgety/restless 1 0 0  Suicidal thoughts 0 0 0  PHQ-9 Score '4 4 3  '$ Difficult doing work/chores   Not difficult at all   Wt Readings from Last 3 Encounters:  09/16/21 147 lb 6.4 oz (66.9 kg)  08/18/21 149 lb 6.4 oz (67.8 kg)  08/12/21 146 lb 12.8 oz (66.6 kg)  A/P: Main reason for follow-up today was to make sure weight was not decreasing further-she has been feeling better overall and appetite has picked up-weight actually up 1 pound.  Depression is not perfectly controlled but working with psychiatry on this-would consider reasonably well-controlled given PHQ-9 under 5 -For memory loss undergoing neurological evaluation-we will follow-up at next visit on progress of this  Recommended follow up: Return in about 3 months (around 12/17/2021) for followup or sooner if needed.Schedule b4 you leave. Future Appointments  Date Time Provider [place]  09/25/2021  3:30 PM [facility] 2 [facility]  11/06/2021  8:45 AM [facility] HEALTH COACH [facility]  12/16/2021 11:20 AM Marin Olp, MD [facility]  03/17/2022 11:00 AM Genia Harold, MD [facility] None  04/26/2022  1:00 PM Marin Olp, MD [facility]    Lab/Order associations:   ICD-10-CM   1. Mixed hyperlipidemia  E78.2     2. Hypertension, essential  I10      3. Major depression, recurrent, full remission ([place])  F33.42      Return precautions advised.  Garret Reddish, MD

## 2021-09-25 ENCOUNTER — Ambulatory Visit
Admission: RE | Admit: 2021-09-25 | Discharge: 2021-09-25 | Disposition: A | Discharge status: Home / Self Care | Payer: Medicare Other | Source: Ambulatory Visit | Attending: Family Medicine | Admitting: Family Medicine

## 2021-09-25 DIAGNOSIS — Z1231 Encounter for screening mammogram for malignant neoplasm of breast: Secondary | ICD-10-CM

## 2021-10-19 ENCOUNTER — Encounter: Payer: Self-pay | Admitting: *Deleted

## 2021-10-22 ENCOUNTER — Encounter: Payer: Self-pay | Admitting: Psychiatry

## 2021-11-04 ENCOUNTER — Telehealth: Payer: Self-pay | Admitting: Family Medicine

## 2021-11-04 NOTE — Telephone Encounter (Signed)
Copied from [place] [id number]. Topic: Medicare AWV >> Nov 04, 2021  2:42 PM Devoria Glassing wrote: Reason for CRM: LVM 11/04/21 appt time change for AWV appt from 9:00am to 9:15am w/ Sharee Pimple. Please confirm

## 2021-11-05 NOTE — Patient Instructions (Signed)

## 2021-11-05 NOTE — Progress Notes (Signed)
Subjective:   Ann Washington is a 71 y.o. female who presents for Medicare Annual (Subsequent) preventive examination. I connected with  Ann Washington on 11/06/21 by a audio enabled telemedicine application and verified that I am speaking with the correct person using two identifiers.  Patient Location: Home  Provider Location: Home Office  I discussed the limitations of evaluation and management by telemedicine. The patient expressed understanding and agreed to proceed.  Review of Systems    Deferred to PCP Cardiac Risk Factors include: advanced age (>28mn, >>29women);dyslipidemia;hypertension     Objective:    There were no vitals filed for this visit. There is no height or weight on file to calculate BMI.     11/06/2021    9:28 AM 02/22/2021    1:18 PM 10/24/2020    8:55 AM 10/22/2019    3:17 PM 10/05/2018    9:47 AM 09/14/2017    2:10 PM 04/01/2016    8:58 AM  Advanced Directives  Does Patient Have a Medical Advance Directive? Yes No Yes Yes No No No  Type of AParamedicof AVoladoras ComunidadLiving will   HFort SmithLiving will     Does patient want to make changes to medical advance directive? No - Patient declined  Yes (MAU/Ambulatory/Procedural Areas - Information given)      Copy of [place] Chart? No - copy requested   No - copy requested     Would patient like information on creating a medical advance directive?  No - Patient declined   Yes (MAU/Ambulatory/Procedural Areas - Information given) No - Patient declined     Current Medications (verified) Outpatient Encounter Medications as of 11/06/2021  Medication Sig   amLODipine-valsartan (EXFORGE) 5-320 MG tablet Take 1 tablet by mouth daily.   aspirin EC 81 MG tablet Take 1 tablet (81 mg total) by mouth daily. Swallow whole.   escitalopram (LEXAPRO) 5 MG tablet Take 5 mg by mouth daily.   fluticasone (FLONASE) 50 MCG/ACT nasal spray SPRAY 2 SPRAYS INTO  EACH NOSTRIL EVERY DAY   latanoprost (XALATAN) 0.005 % ophthalmic solution 1 drop at bedtime.   Multiple Vitamin (MULTIVITAMIN) tablet Take 1 tablet by mouth daily.   omeprazole (PRILOSEC) 20 MG capsule Take 20 mg by mouth daily.   timolol (TIMOPTIC) 0.5 % ophthalmic solution Place 1 drop into both eyes 2 (two) times daily.   traZODone (DESYREL) 50 MG tablet Take 50-100 mg by mouth at bedtime as needed for sleep. Patient takes a quarter to a half a tab daily per spouse   buPROPion (WELLBUTRIN XL) 150 MG 24 hr tablet Take 150 mg by mouth every morning. (Patient not taking: Reported on 11/06/2021)   No facility-administered encounter medications on file as of 11/06/2021.    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   Required high dose PPI, now on prilosec     GAD (generalized anxiety disorder)    GERD (gastroesophageal reflux disease)    Glaucoma    Hyperlipidemia    Hypertension    Insomnia    MCI (mild cognitive impairment)    Snoring 03/08/2008   Tested 2007 per patient-unrevealing for sleep apnea     Past Surgical History:  Procedure Laterality Date   CATARACT EXTRACTION  late 2019   COLONOSCOPY     COLPOSCOPY     age 61   GYNECOLOGIC CRYOSURGERY     age 46   HAMMER TOE SURGERY     TUBAL LIGATION     Family History  Problem Relation Age of Onset   Alzheimer's disease Mother    Hip fracture Mother    Osteoporosis Mother    Deep vein thrombosis Father    Heart disease Father        MI 24s, nonsmoker   Diabetes Sister    Hypertension Sister    Breast cancer Sister        24s   Arthritis Sister    Diabetes Maternal Grandmother    Stroke Maternal Grandfather    Depression Paternal Grandmother    Suicidality Paternal Grandfather    Depression Paternal  Grandfather    Hypertension Brother    Colon cancer Neg Hx    Social History   Socioeconomic History   Marital status: Married    Spouse name: Coralyn Mark   Number of children: 3   Years of education: Not on file   Highest education level: Some college, no degree  Occupational History   Not on file  Tobacco Use   Smoking status: Never   Smokeless tobacco: Never  Vaping Use   Vaping Use: Never used  Substance and Sexual Activity   Alcohol use: No   Drug use: No   Sexual activity: Yes    Birth control/protection: Surgical  Other Topics Concern   Not on file  Social History Narrative   Married (husband patient at [facility]). 3 children. 7 grandkids.       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      Hobbies: time with grandkids, walk when weather nice, reading, tv   Social Determinants of Health   Financial Resource Strain: Low Risk  (11/06/2021)   Overall Financial Resource Strain (CARDIA)    Difficulty of Paying Living Expenses: Not hard at all  Food Insecurity: No Food Insecurity (11/06/2021)   Hunger Vital Sign    Worried About Running Out of Food in the Last Year: Never true    Ran Out of Food in the Last Year: Never true  Transportation Needs: No Transportation Needs (11/06/2021)   PRAPARE - Hydrologist (Medical): No    Lack of Transportation (Non-Medical): No  Physical Activity: Inactive (11/06/2021)   Exercise Vital Sign    Days of Exercise per Week: 0 days    Minutes of Exercise per Session: 0 min  Stress: No Stress Concern Present (11/06/2021)   [place]    Feeling of Stress : Not at all  Social Connections: Moderately Integrated (11/06/2021)   Social Connection and Isolation Panel [NHANES]    Frequency of Communication with Friends and Family: More than three times a week    Frequency of Social Gatherings with Friends and  Family: More than three times a week    Attends Religious Services: More than 4 times per year    Active Member of [employer] or Organizations: No    Attends Archivist Meetings: Never    Marital Status: Married    Tobacco Counseling Counseling given: Not Answered   Clinical Intake:  Pre-visit preparation completed: Yes  Pain : No/denies pain     Nutritional Status: BMI 25 -29 Overweight Nutritional Risks: None Diabetes: No  How often do you need to have someone help you when you read instructions, pamphlets, or other written materials from your doctor or pharmacy?: 1 - Never What is the last grade level you completed in school?: college  Diabetic?No  Interpreter Needed?: No  Information entered by :: Emelia Loron RN   Activities of Daily Living    11/06/2021    9:29 AM  In your present state of health, do you have any difficulty performing the following activities:  Hearing? 0  Vision? 0  Difficulty concentrating or making decisions? 0  Walking or climbing stairs? 0  Dressing or bathing? 0  Doing errands, shopping? 0  Preparing Food and eating ? N  Using the Toilet? N  In the past six months, have you accidently leaked urine? N  Do you have problems with loss of bowel control? N  Managing your Medications? N  Managing your Finances? N  Housekeeping or managing your Housekeeping? N    Patient Care Team: Marin Olp, MD as PCP - General (Family Medicine) Jettie Booze, MD as PCP - Cardiology (Cardiology) Paula Compton, MD as Consulting Physician (Obstetrics and Gynecology) Rolm Bookbinder, MD as Consulting Physician (Dermatology) Letta Pate Luanna Salk, MD as Consulting Physician (Physical Medicine and Rehabilitation) Marygrace Drought, MD as Consulting Physician (Ophthalmology) Garrel Ridgel, DPM as Consulting Physician (Podiatry) Edythe Clarity, [facility] (Pharmacist)  Indicate any recent Medical Services you may have received from other than  [facility] providers in the past year (date may be approximate).     Assessment:   This is a routine wellness examination for Keshauna.  Hearing/Vision screen No results found.  Dietary issues and exercise activities discussed: Current Exercise Habits: The patient does not participate in regular exercise at present, Exercise limited by: None identified   Goals Addressed             This Visit's Progress    Patient Stated       To increase the amount of walking I do.      Depression Screen    11/06/2021    9:39 AM 09/16/2021   10:39 AM 08/12/2021   10:26 AM 04/23/2021    9:27 AM 02/24/2021    4:10 PM 10/24/2020    8:54 AM 10/09/2020    8:16 AM  PHQ 2/9 Scores  PHQ - 2 Score 0 0 2 0 6 0 0  PHQ- 9 Score  '4 4 3 15  '$ 0    Fall Risk    11/06/2021    9:32 AM 04/23/2021    9:27 AM 10/24/2020    8:55 AM 04/04/2020    8:06 AM 10/22/2019    3:18 PM  Fall Risk   Falls in the past year? 1 0 0 0 0  Number falls in past yr: 0 0 0 0 0  Injury with Fall? 0 0 0 0 0  Risk for fall due to : History of fall(s);Impaired balance/gait;Impaired mobility No Fall Risks Impaired vision  Impaired vision;Impaired mobility;Impaired balance/gait  Risk for fall due to: Comment     weakness on ankle at times and affects walking  Follow up Falls evaluation completed Falls evaluation completed Falls prevention discussed      FALL RISK PREVENTION PERTAINING TO THE HOME:  Any stairs in or around the home? Yes  If so, are there any without handrails? Yes  Home free of loose throw rugs in walkways, pet beds, electrical cords, etc? Yes  Adequate lighting in your home to reduce  risk of falls? Yes   ASSISTIVE DEVICES UTILIZED TO PREVENT FALLS:  Life alert? No  Use of a cane, walker or w/c? No  Grab bars in the bathroom? Yes  Shower chair or bench in shower? No  Elevated toilet seat or a handicapped toilet? No   Cognitive Function:      08/18/2021   11:33 AM  Montreal Cognitive Assessment    Visuospatial/ Executive (0/5) 2  Naming (0/3) 3  Attention: Read list of digits (0/2) 2  Attention: Read list of letters (0/1) 1  Attention: Serial 7 subtraction starting at 100 (0/3) 3  Language: Repeat phrase (0/2) 0  Language : Fluency (0/1) 0  Abstraction (0/2) 2  Delayed Recall (0/5) 3  Orientation (0/6) 6  Total 22      11/06/2021    9:30 AM 10/24/2020    8:58 AM 10/22/2019    3:22 PM 10/05/2018    9:59 AM 09/14/2017    2:20 PM  6CIT Screen  What Year? 0 points 0 points 0 points 0 points 0 points  What month? 0 points 0 points 0 points 0 points 0 points  What time? 0 points 0 points  0 points 0 points  Count back from 20 0 points 0 points 0 points 0 points 0 points  Months in reverse 0 points 0 points 0 points 0 points 0 points  Repeat phrase 0 points 0 points 0 points 0 points   Total Score 0 points 0 points  0 points     Immunizations Immunization History  Administered Date(s) Administered   Fluad Quad(high Dose 65+) 11/10/2020   Influenza,inj,Quad PF,6+ Mos 11/15/2012, 10/30/2015, 10/12/2019   Influenza-Unspecified 09/25/2013, 11/10/2014, 10/29/2016   PFIZER(Purple Top)SARS-COV-2 Vaccination 02/15/2019, 03/08/2019, 11/09/2019   Pfizer Covid-19 Vaccine Bivalent Booster 73yr & up 11/10/2020   Pneumococcal Conjugate-13 03/08/2014   Pneumococcal Polysaccharide-23 03/13/2015   Td 01/26/2003   Tdap 10/16/2015   Zoster, Live 12/12/2009    TDAP status: Up to date  Flu Vaccine status: Due, Education has been provided regarding the importance of this vaccine. Advised may receive this vaccine at local pharmacy or Health Dept. Aware to provide a copy of the vaccination record if obtained from local pharmacy or Health Dept. Verbalized acceptance and understanding.  Pneumococcal vaccine status: Up to date  Covid-19 vaccine status: Information provided on how to obtain vaccines.   Qualifies for Shingles Vaccine? Yes   Zostavax completed No   Shingrix Completed?: No.     Education has been provided regarding the importance of this vaccine. Patient has been advised to call insurance company to determine out of pocket expense if they have not yet received this vaccine. Advised may also receive vaccine at local pharmacy or Health Dept. Verbalized acceptance and understanding.  Screening Tests Health Maintenance  Topic Date Due   COVID-19 Vaccine (5 - Pfizer risk series) 01/05/2021   Zoster Vaccines- Shingrix (1 of 2) 12/17/2021 (Originally 01/01/1970)   INFLUENZA VACCINE  04/25/2022 (Originally 08/25/2021)   COLONOSCOPY (Pts 45-412yrInsurance coverage will need to be confirmed)  08/10/2022   MAMMOGRAM  09/26/2023   TETANUS/TDAP  10/15/2025   Pneumonia Vaccine 73Years old  Completed   DEXA SCAN  Completed   Hepatitis C Screening  Completed   HPV VACCINES  Aged Out    Health Maintenance  Health Maintenance Due  Topic Date Due   COVID-19 Vaccine (5 - PfBurnsisk series) 01/05/2021    Colorectal cancer screening: Type of screening: Colonoscopy. Completed 08/09/12.  Repeat every 10 years  Mammogram status: Completed 09/25/21. Repeat every year  Bone Density status: Completed 05/31/19. Results reflect: Bone density results: NORMAL. Repeat every 2 years.  Lung Cancer Screening: (Low Dose CT Chest recommended if Age 23-80 years, 30 pack-year currently smoking OR have quit w/in 15years.) does not qualify.   Additional Screening:  Hepatitis C Screening: does qualify; Completed 03/13/15  Vision Screening: Recommended annual ophthalmology exams for early detection of glaucoma and other disorders of the eye. Is the patient up to date with their annual eye exam?  Yes  Who is the provider or what is the name of the office in which the patient attends annual eye exams? Dr. Satira Sark If pt is not established with a provider, would they like to be referred to a provider to establish care?  N/A .   Dental Screening: Recommended annual dental exams for proper oral  hygiene  Community Resource Referral / Chronic Care Management: CRR required this visit?  No   CCM required this visit?  No      Plan:     I have personally reviewed and noted the following in the patient's chart:   Medical and social history Use of alcohol, tobacco or illicit drugs  Current medications and supplements including opioid prescriptions. Patient is not currently taking opioid prescriptions. Functional ability and status Nutritional status Physical activity Advanced directives List of other physicians Hospitalizations, surgeries, and ER visits in previous 12 months Vitals Screenings to include cognitive, depression, and falls Referrals and appointments  In addition, I have reviewed and discussed with patient certain preventive protocols, quality metrics, and best practice recommendations. A written personalized care plan for preventive services as well as general preventive health recommendations were provided to patient.     Michiel Cowboy, RN   11/06/2021   Nurse Notes:  Ms. Seelig , Thank you for taking time to come for your Medicare Wellness Visit. I appreciate your ongoing commitment to your health goals. Please review the following plan we discussed and let me know if I can assist you in the future.   These are the goals we discussed:  Goals      Exercise 150 minutes per week (moderate activity)     Will bump up to 5 days at [employer] Will bike or walk in between the arch trainer      Patient Stated     Reduce cholesterol and glucose level     Patient Stated     Continue exercise      Patient Stated     To increase the amount of walking I do.     PharmD Care Plan     CARE PLAN ENTRY (see longitudinal plan of care for additional care plan information)  Current Barriers:  Chronic Disease Management support, education, and care coordination needs related to Hypertension, Hyperlipidemia, hyperglycemia, and depression.   Hypertension BP  Readings from Last 3 Encounters:  03/29/19 130/80  11/28/18 122/66  11/17/18 123/62  Pharmacist Clinical Goal(s): Over the next 180 days, patient will work with PharmD and providers to maintain BP goal <130/80 Current regimen:  Amlodipine-valsartan 5-320 mg once daily Interventions: Continue current management Patient self care activities - Over the next 180 days, patient will: Check BP at least once every 1-2 weeks, document, and provide at future appointments Ensure daily salt intake < 2300 mg/day  Hyperlipidemia Lab Results  Component Value Date/Time   LDLCALC 179 (H) 09/21/2018 10:42 AM   LDLDIRECT 151.0 03/29/2019 10:24  AM  Pharmacist Clinical Goal(s): Over the next 180 days, patient will work with PharmD and providers to achieve LDL goal < 100; longitudinal LDL goal <70. Current regimen:  Crestor 5 mg once WEEKLY Red yeast rice extract daily  Interventions: Diet/exercise recommendations Patient self care activities - Over the next 180 days, patient will: Continue current management  Hyperglycemia Lab Results  Component Value Date/Time   HGBA1C 6.1 03/29/2019 10:24 AM   HGBA1C 5.5 03/29/2019 10:16 AM   HGBA1C 6.3 09/21/2018 10:42 AM  Pharmacist Clinical Goal(s): Over the next 180 days, patient will work with PharmD and providers to maintain A1c goal <6.5% Current regimen:  No current medications Interventions: Diet/exercise recommendations Patient self care activities - Over the next 180 days, patient will: Replace main sources of carbohydrates with fruits/vegetables   Depression Pharmacist Clinical Goal(s) Over the next 180 days, patient will work with PharmD and providers to minimize symptoms of depression Current regimen:  Escitalopram 10 mg once daily Interventions: Continue current management Patient self care activities - Over the next 180 days, patient will: Continue current management  Medication management Pharmacist Clinical Goal(s): Over the  next 180 days, patient will work with PharmD and providers to maintain optimal medication adherence Current pharmacy: [facility] Order Interventions Comprehensive medication review performed. Continue current medication management strategy Patient self care activities - Over the next 180 days, patient will: Take medications as prescribed Report any questions or concerns to PharmD and/or provider(s) Initial goal documentation.     Weight (lb) < 170 lb (77.1 kg)     Check out  online nutrition programs as GumSearch.nl and http://vang.com/;  Calorie king.com  Look for foods with "whole" wheat; bran; oatmeal etc Shot at the farmer's markets in season for fresher choices  Watch for "hydrogenated" on the label of oils which are trans-fats.  Watch for "high fructose corn syrup" in snacks, yogurt or ketchup  Meats have less marbling; bright colored fruits and vegetables;  Canned; dump out liquid and wash vegetables. Be mindful of what we are eating  Portion control is essential to a health weight! Sit down; take a break and enjoy your meal; take smaller bites; put the fork down between bites;  It takes 20 minutes to get full; so check in with your fullness cues and stop eating when you start to fill full               This is a list of the screening recommended for you and due dates:  Health Maintenance  Topic Date Due   COVID-19 Vaccine (5 - Pfizer risk series) 01/05/2021   Zoster (Shingles) Vaccine (1 of 2) 12/17/2021*   Flu Shot  04/25/2022*   Colon Cancer Screening  08/10/2022   Mammogram  09/26/2023   Tetanus Vaccine  10/15/2025   Pneumonia Vaccine  Completed   DEXA scan (bone density measurement)  Completed   Hepatitis C Screening: USPSTF Recommendation to screen - Ages 55-79 yo.  Completed   HPV Vaccine  Aged Out  *Topic was postponed. The date shown is not the original due date.

## 2021-11-06 ENCOUNTER — Ambulatory Visit (INDEPENDENT_AMBULATORY_CARE_PROVIDER_SITE_OTHER): Payer: Medicare Other | Admitting: *Deleted

## 2021-11-06 ENCOUNTER — Ambulatory Visit: Payer: Medicare Other

## 2021-11-06 DIAGNOSIS — Z0000 Encounter for general adult medical examination without abnormal findings: Secondary | ICD-10-CM

## 2021-12-16 ENCOUNTER — Ambulatory Visit: Payer: Medicare Other | Admitting: Family Medicine

## 2021-12-25 ENCOUNTER — Encounter: Payer: Self-pay | Admitting: Psychiatry

## 2021-12-25 DIAGNOSIS — G3183 Dementia with Lewy bodies: Secondary | ICD-10-CM

## 2021-12-25 DIAGNOSIS — R4789 Other speech disturbances: Secondary | ICD-10-CM

## 2021-12-25 DIAGNOSIS — R413 Other amnesia: Secondary | ICD-10-CM

## 2021-12-29 ENCOUNTER — Telehealth: Payer: Self-pay | Admitting: Psychiatry

## 2021-12-29 NOTE — Telephone Encounter (Signed)
Referral for Speech Therapy faxed to [facility] as requested by neurologist. Phone: [phone], [phone]

## 2022-01-07 ENCOUNTER — Telehealth: Payer: Self-pay | Admitting: Psychiatry

## 2022-01-07 NOTE — Telephone Encounter (Signed)
[place]: [id number] 01/07/22-02/21/22 sent to [facility] [phone]

## 2022-01-11 ENCOUNTER — Telehealth: Payer: Self-pay

## 2022-01-11 NOTE — Telephone Encounter (Signed)
Sleep Lab Manager Meagan A. has asked to add additional notes regarding call to the sleep lab. First call received by Tanzania from [place] was on my VM on Thursday 12/14, VM was received after our office had closed for [place] party. Call today was the second call received to anyone in the sleep (first being the VM left on Thursday) and was sent to [place]. Without contacting me first. I then returned Brittany's call and LVM as she did not answer.  Shortly after Tanzania from [facility] called me back and explained that this was the first VM or call I had received from her and our closed early Thursday and is closed every Friday. She was very upset that the patient's referral had not been received and it was sent back in September. I apologized and let her know I was out on maternity leave in September so any calls, VM, or referrals sent during that month would not have come to me as I was not in the office. I requested she re-fax the referral to my direct fax number and it was received while I was still on the phone with her. Referral was addressed to Dr. Billey Gosling, I clarified to Tanzania that Dr. Billey Gosling does not do sleep - the patient is established with Dr. Billey Gosling but would be seeing a different provider for any sleep concerns in our office. Tanzania understood and left her direct number should we have anymore questions. Referral is currently under review as OV notes mostly state insomnia concerns and our providers do not treat insomnia.

## 2022-01-11 NOTE — Telephone Encounter (Signed)
FYI:  Spoke with Tanzania at [place] regarding patiens referral to sleep. She was very upset as she has left multiple voicemail's with the sleep lab (both referral coordinator and manager) regarding this patients referral that was sent back in September and has not heard back from the sleep lab. Advised sleep lab manager that [place] was on the line and transferred her to her direct number.

## 2022-01-14 ENCOUNTER — Encounter (HOSPITAL_COMMUNITY)
Admission: RE | Admit: 2022-01-14 | Discharge: 2022-01-14 | Disposition: A | Discharge status: Home / Self Care | Payer: Medicare Other | Source: Ambulatory Visit | Attending: Psychiatry | Admitting: Psychiatry

## 2022-01-14 DIAGNOSIS — G3183 Dementia with Lewy bodies: Secondary | ICD-10-CM | POA: Diagnosis present

## 2022-01-14 DIAGNOSIS — F02818 Dementia in other diseases classified elsewhere, unspecified severity, with other behavioral disturbance: Secondary | ICD-10-CM | POA: Insufficient documentation

## 2022-01-14 MED ORDER — IOFLUPANE I 123 185 MBQ/2.5ML IV SOLN
4.5000 | Freq: Once | INTRAVENOUS | Status: AC | PRN
  Administered 2022-01-14: 4.5 via INTRAVENOUS
  Filled 2022-01-14: qty 5

## 2022-01-14 MED ORDER — POTASSIUM IODIDE (ANTIDOTE) 130 MG PO TABS
ORAL_TABLET | ORAL | Status: AC
  Filled 2022-01-14: qty 1

## 2022-01-15 ENCOUNTER — Other Ambulatory Visit: Payer: Self-pay | Admitting: Psychiatry

## 2022-01-15 DIAGNOSIS — G309 Alzheimer's disease, unspecified: Secondary | ICD-10-CM

## 2022-01-29 ENCOUNTER — Telehealth: Payer: Self-pay | Admitting: Psychiatry

## 2022-01-29 NOTE — Telephone Encounter (Signed)
[employer] is requiring a peer to peer be done for this request or it will be denied. Phone number [phone] option 3. Reference #[id number]

## 2022-02-01 ENCOUNTER — Encounter: Payer: Self-pay | Admitting: Psychiatry

## 2022-02-01 NOTE — Telephone Encounter (Signed)
Denial letter put in pod 3

## 2022-02-02 ENCOUNTER — Encounter: Payer: Self-pay | Admitting: Neurology

## 2022-02-02 NOTE — Telephone Encounter (Signed)
Ann Washington, did the packet mention any other way to appeal this case? I wasn't able to get a hold of anyone for peer to peer

## 2022-02-02 NOTE — Telephone Encounter (Signed)
Appeal letter written and signed by Dr Billey Gosling. Faxed to [facility] appeals [phone] Case # [id number] According to the appeal letter, insurance can take up to 60 days before providing a determination

## 2022-02-02 NOTE — Telephone Encounter (Signed)
Called P2P number but was on hold for 25 minutes and was unable to reach a representative. Is there any other way we can appeal?

## 2022-02-03 ENCOUNTER — Encounter: Payer: Self-pay | Admitting: Family Medicine

## 2022-02-04 NOTE — Telephone Encounter (Signed)
Spoke with Jocelyn A from [employer] regarding appeal for PET scan. Appeal was approved. Approval # [id number]. We will also receive a mailed confirmation. If we have any questions, we can call provider services [phone]

## 2022-02-15 ENCOUNTER — Ambulatory Visit (HOSPITAL_COMMUNITY)
Admission: RE | Admit: 2022-02-15 | Discharge: 2022-02-15 | Disposition: A | Discharge status: Home / Self Care | Payer: Medicare Other | Source: Ambulatory Visit | Attending: Psychiatry | Admitting: Psychiatry

## 2022-02-15 DIAGNOSIS — G309 Alzheimer's disease, unspecified: Secondary | ICD-10-CM | POA: Insufficient documentation

## 2022-02-15 LAB — GLUCOSE, CAPILLARY: Glucose-Capillary: 104 mg/dL — ABNORMAL HIGH (ref 70–99)

## 2022-02-15 MED ORDER — FLUDEOXYGLUCOSE F - 18 (FDG) INJECTION
10.0200 | Freq: Once | INTRAVENOUS | Status: AC | PRN
  Administered 2022-02-15: 10.02 via INTRAVENOUS

## 2022-02-16 ENCOUNTER — Encounter: Payer: Self-pay | Admitting: Psychiatry

## 2022-02-17 NOTE — Addendum Note (Signed)
Encounter addended by: Tania Ade on: 02/17/2022 8:44 AM  Actions taken: Imaging Exam ended, Charge Capture section accepted

## 2022-02-18 ENCOUNTER — Ambulatory Visit: Payer: Medicare Other | Admitting: Family Medicine

## 2022-02-18 ENCOUNTER — Encounter: Payer: Self-pay | Admitting: Family Medicine

## 2022-02-18 VITALS — BP 138/84 | HR 78 | Temp 97.4°F | Ht 61.0 in | Wt 145.0 lb

## 2022-02-18 DIAGNOSIS — R739 Hyperglycemia, unspecified: Secondary | ICD-10-CM

## 2022-02-18 DIAGNOSIS — E782 Mixed hyperlipidemia: Secondary | ICD-10-CM

## 2022-02-18 DIAGNOSIS — N183 Chronic kidney disease, stage 3 unspecified: Secondary | ICD-10-CM

## 2022-02-18 LAB — CBC WITH DIFFERENTIAL/PLATELET
Basophils Absolute: 0 10*3/uL (ref 0.0–0.1)
Basophils Relative: 0.5 % (ref 0.0–3.0)
Eosinophils Absolute: 0.4 10*3/uL (ref 0.0–0.7)
Eosinophils Relative: 4.6 % (ref 0.0–5.0)
HCT: 37.4 % (ref 36.0–46.0)
Hemoglobin: 12.7 g/dL (ref 12.0–15.0)
Lymphocytes Relative: 21.7 % (ref 12.0–46.0)
Lymphs Abs: 2 10*3/uL (ref 0.7–4.0)
MCHC: 34.1 g/dL (ref 30.0–36.0)
MCV: 91.3 fl (ref 78.0–100.0)
Monocytes Absolute: 0.7 10*3/uL (ref 0.1–1.0)
Monocytes Relative: 7.6 % (ref 3.0–12.0)
Neutro Abs: 6.2 10*3/uL (ref 1.4–7.7)
Neutrophils Relative %: 65.6 % (ref 43.0–77.0)
Platelets: 312 10*3/uL (ref 150.0–400.0)
RBC: 4.1 Mil/uL (ref 3.87–5.11)
RDW: 13 % (ref 11.5–15.5)
WBC: 9.4 10*3/uL (ref 4.0–10.5)

## 2022-02-18 LAB — COMPREHENSIVE METABOLIC PANEL
ALT: 15 U/L (ref 0–35)
AST: 16 U/L (ref 0–37)
Albumin: 4.1 g/dL (ref 3.5–5.2)
Alkaline Phosphatase: 72 U/L (ref 39–117)
BUN: 18 mg/dL (ref 6–23)
CO2: 25 mEq/L (ref 19–32)
Calcium: 9.6 mg/dL (ref 8.4–10.5)
Chloride: 104 mEq/L (ref 96–112)
Creatinine, Ser: 0.63 mg/dL (ref 0.40–1.20)
GFR: 88.18 mL/min (ref >60.00)
Glucose, Bld: 100 mg/dL — ABNORMAL HIGH (ref 70–99)
Potassium: 4 mEq/L (ref 3.5–5.1)
Sodium: 139 mEq/L (ref 135–145)
Total Bilirubin: 0.5 mg/dL (ref 0.2–1.2)
Total Protein: 7.2 g/dL (ref 6.0–8.3)

## 2022-02-18 LAB — LDL CHOLESTEROL, DIRECT: Direct LDL: 131 mg/dL

## 2022-02-18 LAB — HEMOGLOBIN A1C: Hgb A1c MFr Bld: 6 % (ref 4.6–6.5)

## 2022-02-18 LAB — TSH: TSH: 2.45 u[IU]/mL (ref 0.35–5.50)

## 2022-02-18 NOTE — Progress Notes (Signed)
Phone [phone] In person visit   Subjective:   Ann Washington is a 72 y.o. year old very pleasant female patient who presents for/with See problem oriented charting Chief Complaint  Patient presents with   Follow-up   Hyperlipidemia   Hypertension   Depression   Night Sweats    Pt c/o night sweats again.    Past Medical History-  Patient Active Problem List   Diagnosis Date Noted   CKD (chronic kidney disease), stage III ([place]) 03/14/2014    Priority: Medium    Glaucoma 03/08/2014    Priority: Medium    Insomnia 03/08/2014    Priority: Medium    Hyperglycemia 12/09/2009    Priority: Medium    Hyperlipidemia 10/13/2006    Priority: Medium    Major depression, recurrent, full remission (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 90 tablet 3   aspirin EC 81 MG tablet Take 1 tablet (81 mg total) by mouth daily. Swallow whole. 30 tablet 12   fluticasone (FLONASE) 50 MCG/ACT nasal spray SPRAY 2 SPRAYS INTO EACH NOSTRIL EVERY DAY 48 mL 1   latanoprost (XALATAN) 0.005 % ophthalmic solution 1 drop at bedtime.     Multiple Vitamin (MULTIVITAMIN) tablet Take 1 tablet by mouth daily.     omeprazole (PRILOSEC) 20 MG capsule Take 20 mg by mouth daily.     ramelteon (ROZEREM) 8 MG tablet Take 8 mg by mouth at bedtime.     timolol (TIMOPTIC) 0.5 % ophthalmic solution Place 1 drop into both eyes 2 (two) times daily.  0   No  current facility-administered medications for this visit.     Objective:  BP 138/84   Pulse 78   Temp (!) 97.4 F (36.3 C)   Ht '5\' 1"'$  (1.549 m)   Wt 145 lb (65.8 kg)   SpO2 99%   BMI 27.40 kg/m  Gen: NAD, resting comfortably CV: RRR stable murmur-known aortic stenosis  Lungs: CTAB no crackles, wheeze, rhonchi Abdomen: soft/nontender/nondistended/normal bowel sounds. No rebound or guarding.  Ext: no edema Skin: warm, dry Neuro: grossly normal, moves all extremities     Assessment and Plan   # Memory loss-following with neurology closely-has had DAT scan and PET metabolic which do not clearly point toward cause -  ongoing issues but she hasn't noted worsening -Has 2 upcoming neurology visits within the next month for further information- one for sleep apnea eval  #Depression in full remission # Night sweat S: Compliant with ramelteon 8 mg.  -has been taken off of lexapro and trazodone 50 mg prescribed by psychiatry  -Ambien in the past  -Did have behavioral health urgent care visit on 03/03/2021 -seeing [facility] Karie Georges, NP.   -also notes low energy still and motivation low. TSH normal July 2023. A year ago husband noted no emotion but this year has noted easily emotional such as crying over  something seemingly simple. Still working with [facility]. Weight within 2 lbs. Not febrile when wakes up.   - reports had night sweats over 2 years ago and they have recurred. Most nights- wakes up sweaty but not drenching sheets or clotehs    02/18/2022   10:52 AM 11/06/2021    9:39 AM 09/16/2021   10:39 AM  Depression screen PHQ 2/9  Decreased Interest 0 0 0  Down, Depressed, Hopeless 0 0 0  PHQ - 2 Score 0 0 0  Altered sleeping 0  1  Tired, decreased energy 0  2  Change in appetite 0  0  Feeling bad or failure about yourself  0  0  Trouble concentrating 0  0  Moving slowly or fidgety/restless 0  1  Suicidal thoughts 0  0  PHQ-9 Score 0  4  Difficult  doing work/chores Not difficult at all    A/P:  Phq9 low but sounds underreported - with crying spells sounds like poorly controlled depression- they have one more step in evaluation with sleep study then may have other med changes through psychiatyr.   Night sweats- unclear cause- only new med is ramelteon and I do not see associations with that and night sweats- we will update labs  -is taking reflux medicine more - some associations with night sweats- taking twice a week  #Hypertension/CKD stage III S: Compliant with amlodipine-valsartan 5-320 mg taking at night Home blood pressure:no recent checks  GFR typically: most recnetly back in 60s or even 70s  BP Readings from Last 3 Encounters:  02/18/22 138/84  09/16/21 138/84  08/18/21 (!) 142/76  A/P: blood pressure high acceptable range- in past when checked at home was even better so encouraged her to restart and update [place] with home readings after a week CKD III- improved on last check - monitor with labs  #Hyperlipidemia #concern for TIA 05/26/21- with aphasia- MRI, MRA, holter, echo largely reassuring other than LVH (cardiology saw) S: medication: Trouble with statins, aspirin 81 mg daily  -07/19/21 reported more fatigue and less pain from typical pains when starting this dose up from 15 mg a week- had opted to try off 2 weeks and noted some improvement-continue to monitor off for now -  Myalgias on pravastatin in the past. Lab Results  Component Value Date   CHOL 185 05/26/2021   HDL 52.20 05/26/2021   LDLCALC 113 (H) 05/26/2021   LDLDIRECT 132.0 04/04/2020   TRIG 100.0 05/26/2021   CHOLHDL 4 05/26/2021  A/P: with history of possible TIA prefer LDL under 70- update direct LDL today- consider atorvastatin- a medication we have not tried- may do 20 mg weekly.     #Hyperglycemia S: Increased risk of diabetes.  No current medication.  Lifestyle modification only. Lab Results  Component Value Date   HGBA1C 6.0 04/23/2021   HGBA1C 6.2  10/09/2020   HGBA1C 6.1 04/04/2020  A/P: hopefully stable- update a1c today. Continue current meds for now    #GERD S: Has been well controlled on Prilosec- had been spacing out but had more issues and back to taking most days now. H2 blocker not helpful.   A/P: reflux worsening some- wonder if increased PPI use contributes to night sweats- she is going ot trial off to see how she does   Recommended follow up: No follow-ups on file. Future Appointments  Date Time Provider [place]  02/22/2022  1:45 PM Star Age, MD [facility] None  03/17/2022 11:00 AM Genia Harold, MD [facility]  None  04/26/2022  1:00 PM Jovan Schickling, Brayton Mars, MD [facility]    Lab/Order associations:   ICD-10-CM   1. Hyperglycemia  R73.9 HgB A1c    2. Mixed hyperlipidemia  E78.2 LDL cholesterol, direct    CBC with Differential/Platelet    Comprehensive metabolic panel    TSH      No orders of the defined types were placed in this encounter.   Return precautions advised.  Garret Reddish, MD

## 2022-02-18 NOTE — Patient Instructions (Addendum)
Please stop by lab before you go If you have mychart- we will send your results within 3 business days of [place] receiving them.  If you do not have mychart- we will call you about results within 5 business days of [place] receiving them.  *please also note that you will see labs on mychart as soon as they post. I will later go in and write notes on them- will say "notes from Dr. Yong Channel"   No changes today unless labs lead [place] to make changes  Recommended follow up: Return for next already scheduled visit or sooner if needed.

## 2022-02-19 ENCOUNTER — Other Ambulatory Visit: Payer: Self-pay

## 2022-02-19 ENCOUNTER — Encounter: Payer: Self-pay | Admitting: Family Medicine

## 2022-02-19 MED ORDER — FLUTICASONE PROPIONATE 50 MCG/ACT NA SUSP: NASAL | 3 refills | Status: DC

## 2022-02-22 ENCOUNTER — Encounter: Payer: Self-pay | Admitting: Neurology

## 2022-02-22 ENCOUNTER — Ambulatory Visit: Payer: Medicare Other | Admitting: Neurology

## 2022-02-22 VITALS — BP 133/78 | HR 84 | Ht 61.0 in | Wt 144.6 lb

## 2022-02-22 DIAGNOSIS — G4700 Insomnia, unspecified: Secondary | ICD-10-CM

## 2022-02-22 DIAGNOSIS — Z9189 Other specified personal risk factors, not elsewhere classified: Secondary | ICD-10-CM | POA: Diagnosis not present

## 2022-02-22 DIAGNOSIS — E663 Overweight: Secondary | ICD-10-CM

## 2022-02-22 DIAGNOSIS — G479 Sleep disorder, unspecified: Secondary | ICD-10-CM

## 2022-02-22 DIAGNOSIS — R0683 Snoring: Secondary | ICD-10-CM

## 2022-02-22 NOTE — Patient Instructions (Signed)
Based on your symptoms and your exam I believe we should look for an underlying organic sleep disorder, such as obstructive sleep apnea (OSA) with a sleep study.  Even if you have mild OSA, I may want you to consider treatment with CPAP. Please remember, the risks and ramifications of moderate to severe obstructive sleep apnea or OSA are: Cardiovascular disease, including congestive heart failure, stroke, difficult to control hypertension, arrhythmias, and even type 2 diabetes has been linked to untreated OSA. Sleep apnea causes disruption of sleep and sleep deprivation in most cases, which, in turn, can cause recurrent headaches, problems with memory, mood, concentration, focus, and vigilance. Most people with untreated sleep apnea report excessive daytime sleepiness, which can affect their ability to drive. Please do not drive if you feel sleepy.   We will call you after your sleep study to advise about the results (most likely, you will hear from one of our nurses).    Our sleep lab administrative assistant, will call you to schedule your sleep study. If you don't hear back from her by about 2 weeks from now, please feel free to call her at [phone]. This is her direct line and please leave a message with your phone number to call back if you get the voicemail box. She will call back as soon as possible.   Your chronic insomnia may be treated with cognitive behavioral therapy (CBT-I). Please talk to Loletta Specter about it.

## 2022-02-22 NOTE — Progress Notes (Signed)
Subjective:    Patient ID: Ann Washington is a 72 y.o. female.  HPI    Star Age, MD, PhD [facility] [address] [place], [place] [address]  Dear Rosaria Ferries,  I saw your patient, Ann Washington, upon your kind request in my sleep clinic today for initial consultation of her sleep disorder, in particular, concern for underlying obstructive sleep apnea.  The patient is accompanied by her husband today.  As you know, Ann Washington is a 72 year old female with an underlying medical history of hypertension, hyperlipidemia, memory loss, anxiety, depression, reflux disease, glaucoma, and overweight state, who reports a longstanding history of difficulty initiating and maintaining sleep.  Per husband, she does snore and sometimes it is loud.  She has been on Ambien for years, she stopped the Ambien with some 15 years ago on her own.  Per patient, she was having issues with overmedication and in 2003 or 2004 she was hospitalized for few days due to overmedication or accidental overdose. Her Epworth sleepiness score is 6/24, fatigue severity score is 51 out of 63.  She is not aware of any sleep apnea and her family.  She goes to bed between 11 PM and midnight and rise time is between 9:30 AM and 11:30 AM typically.  She does not have nightly nocturia and denies any recurrent nocturnal or morning headaches.  She drinks caffeine in limitation, typically 1 cup of coffee in the morning, no alcohol currently, she is a non-smoker.  She is retired from working in [place] at [employer], work part-time for the last 5 or 6 years of her job.  She lives with her husband, no pets in the household, they have 3 grown children.  She does have a TV in her bedroom but does not have it on all night and does not have it on each night.  I reviewed your office visit note from 10/17/2021.  She is currently on trazodone as needed for sleep, 50 mg strength 1 to 2 tablets at  bedtime.  She has previously tried other medications to help her sleep including mirtazapine, zolpidem, and now on ramelteon.  She sees Dr. Billey Gosling for memory loss.  She is no longer on Lexapro or Wellbutrin.  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   Required high dose PPI, now on prilosec     GAD (generalized anxiety disorder)    GERD (gastroesophageal reflux disease)    Glaucoma    Hyperlipidemia    Hypertension    Insomnia    MCI (mild cognitive impairment)    Snoring 03/08/2008   Tested 2007 per patient-unrevealing for sleep apnea      Her Past Surgical History Is Significant For: Past Surgical History:  Procedure Laterality Date   CATARACT EXTRACTION     late 2019   COLONOSCOPY     COLPOSCOPY     age 20   GYNECOLOGIC CRYOSURGERY     age 62   HAMMER TOE SURGERY     TENDON REPAIR Left    TUBAL LIGATION      Her Family History Is Significant For: Family History  Problem Relation Age of Onset   Alzheimer's disease Mother    Hip fracture Mother    Osteoporosis Mother    Deep vein thrombosis Father  Heart disease Father        MI 44s, nonsmoker   Diabetes Sister    Hypertension Sister    Breast cancer Sister        62s   Arthritis Sister    Hypertension Brother    Diabetes Maternal Grandmother    Stroke Maternal Grandfather    Depression Paternal Grandmother    Suicidality Paternal Grandfather    Depression Paternal Grandfather    Colon cancer Neg Hx     Her Social History Is Significant For: Social History   Socioeconomic History   Marital status: Married    Spouse name: Coralyn Mark   Number of children: 3   Years of education: Not on file   Highest education level: Some college, no degree  Occupational History   Not on file  Tobacco Use   Smoking status: Never   Smokeless tobacco: Never   Vaping Use   Vaping Use: Never used  Substance and Sexual Activity   Alcohol use: No   Drug use: No   Sexual activity: Yes    Birth control/protection: Surgical  Other Topics Concern   Not on file  Social History Narrative   Married (husband patient at [facility]). 3 children. 7 grandkids.       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       Hobbies: time with grandkids, walk when weather nice, reading, tv      Caffiene: 2 cups am, decaf occasional at night.    Social Determinants of Health   Financial Resource Strain: Low Risk  (11/06/2021)   Overall Financial Resource Strain (CARDIA)    Difficulty of Paying Living Expenses: Not hard at all  Food Insecurity: No Food Insecurity (11/06/2021)   Hunger Vital Sign    Worried About Running Out of Food in the Last Year: Never true    Ran Out of Food in the Last Year: Never true  Transportation Needs: No Transportation Needs (11/06/2021)   PRAPARE - Hydrologist (Medical): No    Lack of Transportation (Non-Medical): No  Physical Activity: Inactive (11/06/2021)   Exercise Vital Sign    Days of Exercise per Week: 0 days    Minutes of Exercise per Session: 0 min  Stress: No Stress Concern Present (11/06/2021)   [place]    Feeling of Stress : Not at all  Social Connections: Moderately Integrated (11/06/2021)   Social Connection and Isolation Panel [NHANES]    Frequency of Communication with Friends and Family: More than three times a week    Frequency of Social Gatherings with Friends and Family: More than three times a week    Attends Religious Services: More than 4 times per year    Active Member of [employer] or Organizations: No    Attends Archivist Meetings: Never    Marital Status: Married    Her Allergies Are:  Allergies  Allergen Reactions   Amlodipine Besy-Benazepril Hcl Other  (See Comments)    Edema to ankle with redness/ but now she is tolerating the current regiment   Pravastatin     myalgias   Rosuvastatin     Severe fatigue when went to daily   :   Her Current Medications Are:  Outpatient Encounter Medications as of 02/22/2022  Medication Sig   amLODipine-valsartan (EXFORGE) 5-320 MG tablet Take 1 tablet by mouth daily.   aspirin EC 81 MG  tablet Take 1 tablet (81 mg total) by mouth daily. Swallow whole.   fluticasone (FLONASE) 50 MCG/ACT nasal spray SPRAY 2 SPRAYS INTO EACH NOSTRIL EVERY DAY   latanoprost (XALATAN) 0.005 % ophthalmic solution 1 drop at bedtime.   Multiple Vitamin (MULTIVITAMIN) tablet Take 1 tablet by mouth daily.   omeprazole (PRILOSEC) 20 MG capsule Take 20 mg by mouth daily.   ramelteon (ROZEREM) 8 MG tablet Take 8 mg by mouth at bedtime.   timolol (TIMOPTIC) 0.5 % ophthalmic solution Place 1 drop into both eyes 2 (two) times daily.   No facility-administered encounter medications on file as of 02/22/2022.  :   Review of Systems:  Out of a complete 14 point review of systems, all are reviewed and negative with the exception of these symptoms as listed below:  Review of Systems  Neurological:        Sleep Consult.  Not sleeping well.  Sleep study done 19 yrs ago. Insomnia going and staying asleep.  ESS 6, FSS 51.      Objective:  Neurological Exam  Physical Exam Physical Examination:   Vitals:   02/22/22 1335  BP: 133/78  Pulse: 84    General Examination: The patient is a very pleasant 72 y.o. female in no acute distress. She appears well-developed and well-nourished and well groomed.   HEENT: Normocephalic, atraumatic, pupils are equal, round and reactive to light, extraocular tracking is good without limitation to gaze excursion or nystagmus noted. Hearing is grossly intact. Face is symmetric with normal facial animation. Speech is clear with no dysarthria noted. There is no hypophonia. There is no lip, neck/head, jaw  or voice tremor. Neck is supple with full range of passive and active motion. There are no carotid bruits on auscultation. Oropharynx exam reveals: mild mouth dryness, adequate dental hygiene and mild airway crowding, due to small airway entry, Mallampati class IV, tonsils and tip of uvula not fully visualized.  Tongue protrudes centrally.  Neck circumference of 14 1/8 inches.  Minimal overbite.  Chest: Clear to auscultation without wheezing, rhonchi or crackles noted.  Heart: S1+S2+0, regular and normal without murmurs, rubs or gallops noted.   Abdomen: Soft, non-tender and non-distended.  Extremities: There is no pitting edema in the distal lower extremities bilaterally.   Skin: Warm and dry without trophic changes noted.   Musculoskeletal: exam reveals no obvious joint deformities.   Neurologically:  Mental status: The patient is awake, alert and oriented in all 4 spheres. Her immediate and remote memory, attention, language skills and fund of knowledge are appropriate. There is no evidence of aphasia, agnosia, apraxia or anomia. Speech is clear with normal prosody and enunciation. Thought process is linear. Mood is normal and affect is normal.  Cranial nerves II - XII are as described above under HEENT exam.  Motor exam: Normal bulk, strength and tone is noted. There is no obvious action or resting tremor.  Fine motor skills and coordination: grossly intact.  Cerebellar testing: No dysmetria or intention tremor. There is no truncal or gait ataxia.  Sensory exam: intact to light touch in the upper and lower extremities.  Gait, station and balance: She stands easily. No veering to one side is noted. No leaning to one side is noted. Posture is mildly stooped for age, she walks without a walking aid.    Assessment and Plan:  In summary, Ann Washington is a very pleasant 72 y.o.-year old female with an underlying medical history of hypertension, hyperlipidemia, memory loss, anxiety,  depression, reflux disease, glaucoma, and overweight state, who presents for evaluation of her chronic sleep disturbance including difficulty falling and staying asleep.  She had a sleep study some 19 years ago, unclear results, was not offered a CPAP machine at the time.  She would be willing to get reevaluated.   I had a long chat with the patient and her husband about the possibility of an underlying organic sleep disorder with obstructive sleep apnea within the possible considerations.  I explained, in particular, the risks and ramifications of untreated moderate to severe OSA, especially with respect to developing cardiovascular disease down the road, including congestive heart failure (CHF), difficult to treat hypertension, cardiac arrhythmias (particularly A-fib), neurovascular complications including TIA, stroke and dementia. Even type 2 diabetes has, in part, been linked to untreated OSA. Symptoms of untreated OSA may include (but may not be limited to) daytime sleepiness, nocturia (i.e. frequent nighttime urination), memory problems, mood irritability and suboptimally controlled or worsening mood disorder such as depression and/or anxiety, lack of energy, lack of motivation, physical discomfort, as well as recurrent headaches, especially morning or nocturnal headaches. We talked about the importance of maintaining a healthy lifestyle and striving for healthy weight. In addition, we talked about the importance of striving for and maintaining good sleep hygiene. I recommended a sleep study at this time. I outlined the differences between a laboratory attended sleep study which is considered more comprehensive and accurate over the option of a home sleep test (HST); the latter may lead to underestimation of sleep disordered breathing in some instances and does not help with diagnosing upper airway resistance syndrome and is not accurate enough to diagnose primary central sleep apnea typically. I outlined  possible surgical and non-surgical treatment options of OSA, including the use of a positive airway pressure (PAP) device (i.e. CPAP, AutoPAP/APAP or BiPAP in certain circumstances), a custom-made dental device (aka oral appliance, which would require a referral to a specialist dentist or orthodontist typically, and is generally speaking not considered for patients with full dentures or edentulous state), upper airway surgical options, such as traditional UPPP (which is not considered a first-line treatment) or the Inspire device (hypoglossal nerve stimulator, which would involve a referral for consultation with an ENT surgeon, after careful selection, following inclusion criteria - also not first-line treatment). I explained the PAP treatment option to the patient in detail, as this is generally considered first-line treatment.  The patient indicated that she would be willing to try PAP therapy, if the need arises. I explained the importance of being compliant with PAP treatment, not only for insurance purposes but primarily to improve patient's symptoms symptoms, and for the patient's long term health benefit, including to reduce Her cardiovascular risks longer-term.   She is encouraged to talk to you about her chronic insomnia with regards to the possibility of pursuing cognitive behavioral therapy.  She can bring her ramelteon for her sleep study or take it at home if she takes the sleep test at home.   We will pick up our discussion about the next steps and treatment options after testing.  We will keep them posted as to the test results by phone call and/or MyChart messaging where possible.  We will plan to follow-up in sleep clinic accordingly as well.  I answered all their questions today and the patient and her husband were in agreement.   I encouraged her to call with any interim questions, concerns, problems or updates or email [place] through [place].  Generally  speaking, sleep test authorizations may  take up to 2 weeks, sometimes less, sometimes longer, the patient is encouraged to get in touch with [place] if they do not hear back from the sleep lab staff directly within the next 2 weeks.  Thank you very much for allowing me to participate in the care of this nice patient. If I can be of any further assistance to you please do not hesitate to call me at [phone].  Sincerely,   Star Age, MD, PhD

## 2022-02-23 MED ORDER — EZETIMIBE 10 MG PO TABS: 10.0000 mg | ORAL_TABLET | Freq: Every day | ORAL | 5 refills | Status: DC

## 2022-02-23 MED ORDER — FLUTICASONE PROPIONATE 50 MCG/ACT NA SUSP: NASAL | 3 refills | Status: DC

## 2022-03-02 ENCOUNTER — Telehealth: Payer: Self-pay | Admitting: Neurology

## 2022-03-02 NOTE — Telephone Encounter (Signed)
HST- [employer] medicare No auth req   Patient is scheduled at [facility] for 03/30/22 at 1 pm.  Mailed packet to the patient.

## 2022-03-10 ENCOUNTER — Telehealth: Payer: Self-pay | Admitting: Neurology

## 2022-03-10 NOTE — Telephone Encounter (Signed)
Pt is requesting information regarding sleep study to be mailed to her again, pt stated she misplaced the copy that was originally mailed to her

## 2022-03-16 NOTE — Progress Notes (Unsigned)
   CC:  memory loss  Follow-up Visit  Last visit: 08/18/21  Brief HPI: 72 year old female with a history of HTN, GERD, CKD 3, HLD, depression, glaucoma who follows in clinic for memory loss. MRI/MRA brain 06/16/21 were unremarkable other than for mild chronic microvascular ischemic changes. EEG 09/07/21 was normal.  Interval History: She underwent neuropsychological testing in November 2023, which was suggestive of Lewy Body disease vs FTD. DaTSCAN 01/14/22 was equivocal (showed mild asymmetry with increased activity in left striatum vs right). PET scan 02/15/22 did not show any decreased cortical metabolism, and was not suggestive of Alzheimer's disease.   Memory***hallucinations***tremor***gait***cognitive fluctuations***RBD***  Speech***Speech therapy***  Sleep study***  Physical Exam:   Vital Signs: There were no vitals taken for this visit. GENERAL:  well appearing, in no acute distress, alert  SKIN:  Color, texture, turgor normal. No rashes or lesions HEAD:  Normocephalic/atraumatic. RESP: normal respiratory effort MSK:  No gross joint deformities.   NEUROLOGICAL: Mental Status: Alert, oriented to person, place and time, Follows commands, and Speech fluent and appropriate. Cranial Nerves: PERRL, face symmetric, no dysarthria, hearing grossly intact Motor: moves all extremities equally Gait: normal-based.  IMPRESSION: ***  PLAN: ***   Follow-up: ***  I spent a total of *** minutes on the date of the service. Discussed medication side effects, adverse reactions and drug interactions. Written educational materials and patient instructions outlining all of the above were given.  Genia Harold, MD

## 2022-03-17 ENCOUNTER — Encounter: Payer: Self-pay | Admitting: Psychiatry

## 2022-03-17 ENCOUNTER — Ambulatory Visit: Payer: Medicare Other | Admitting: Psychiatry

## 2022-03-17 VITALS — BP 147/89 | HR 78 | Ht 61.0 in | Wt 146.0 lb

## 2022-03-17 DIAGNOSIS — R413 Other amnesia: Secondary | ICD-10-CM

## 2022-03-28 ENCOUNTER — Encounter: Payer: Self-pay | Admitting: Family Medicine

## 2022-03-29 ENCOUNTER — Telehealth: Payer: Self-pay | Admitting: Family Medicine

## 2022-03-29 NOTE — Telephone Encounter (Signed)
Updated blood pressure with most recent home reading

## 2022-03-30 ENCOUNTER — Ambulatory Visit (INDEPENDENT_AMBULATORY_CARE_PROVIDER_SITE_OTHER): Payer: Medicare Other | Admitting: Neurology

## 2022-03-30 DIAGNOSIS — G4734 Idiopathic sleep related nonobstructive alveolar hypoventilation: Secondary | ICD-10-CM

## 2022-03-30 DIAGNOSIS — G4700 Insomnia, unspecified: Secondary | ICD-10-CM

## 2022-03-30 DIAGNOSIS — R0683 Snoring: Secondary | ICD-10-CM

## 2022-03-30 DIAGNOSIS — E663 Overweight: Secondary | ICD-10-CM

## 2022-03-30 DIAGNOSIS — G4733 Obstructive sleep apnea (adult) (pediatric): Secondary | ICD-10-CM | POA: Diagnosis not present

## 2022-03-30 DIAGNOSIS — G479 Sleep disorder, unspecified: Secondary | ICD-10-CM

## 2022-03-30 DIAGNOSIS — Z9189 Other specified personal risk factors, not elsewhere classified: Secondary | ICD-10-CM

## 2022-03-31 NOTE — Progress Notes (Signed)
See procedure note.

## 2022-03-31 NOTE — Procedures (Signed)
[facility]  HOME SLEEP TEST (Watch PAT) REPORT  STUDY DATE: 03/30/22  DOB: 11-15-1950  MRN: [id number]  ORDERING CLINICIAN: Star Age, MD, PhD   REFERRING CLINICIAN: Loletta Specter, NP  CLINICAL INFORMATION/HISTORY: 72 year old female with an underlying medical history of hypertension, hyperlipidemia, memory loss, anxiety, depression, reflux disease, glaucoma, and overweight state, who reports a longstanding history of difficulty initiating and maintaining sleep. She snores, at times loudly per husband.     Epworth sleepiness score: 6/24.  BMI: 27.1 kg/m  FINDINGS:   Sleep Summary:   Total Recording Time (hours, min): 8 hours, 20 min  Total Sleep Time (hours, min):  7 hours, 39 min  Percent REM (%):    23.6%   Respiratory Indices:   Calculated pAHI (per hour):  56.1/hour         REM pAHI:    51.4/hour       NREM pAHI: 57.6/hour  Central pAHI: 1.8/hour  Oxygen Saturation Statistics:    Oxygen Saturation (%) Mean: 92%   Minimum oxygen saturation (%):                 52%   O2 Saturation Range (%): 52-99%    O2 Saturation (minutes) <=88%: 18.5 min  Pulse Rate Statistics:   Pulse Mean (bpm):    54/min    Pulse Range (40-79/min)   IMPRESSION: OSA (obstructive sleep apnea), severe Nocturnal hypoxemia  RECOMMENDATION:  This home sleep test demonstrates severe obstructive sleep apnea with a total AHI of 56.1/hour and O2 nadir of 52% with significant time below or at 88% saturation of over 18 minutes for the night, indicating nocturnal hypoxemia.  Snoring was detected, in the mild to moderate range.  Treatment with positive airway pressure is highly recommended. This will require - ideally - a full night CPAP titration study for proper treatment settings, O2 monitoring and mask fitting. For now, the patient will be advised to proceed with an autoPAP titration/trial at home. A laboratory attended titration study can be considered in the future  for optimization of treatment settings and to improve tolerance and compliance. Alternative treatment options are limited secondary to the severity of the patient's sleep disordered breathing, but may include surgical treatment with an implantable hypoglossal nerve stimulator (in carefully selected candidates, meeting criteria).  Concomitant weight loss is recommended, where clinically appropriate. Please note, that untreated obstructive sleep apnea may carry additional perioperative morbidity. Patients with significant obstructive sleep apnea should receive perioperative PAP therapy and the surgeons and particularly the anesthesiologist should be informed of the diagnosis and the severity of the sleep disordered breathing. The patient should be cautioned not to drive, work at heights, or operate dangerous or heavy equipment when tired or sleepy. Review and reiteration of good sleep hygiene measures should be pursued with any patient. Other causes of the patient's symptoms, including circadian rhythm disturbances, an underlying mood disorder, medication effect and/or an underlying medical problem cannot be ruled out based on this test. Clinical correlation is recommended.  The patient and her referring provider will be notified of the test results. The patient will be seen in follow up in sleep clinic at [facility].  I certify that I have reviewed the raw data recording prior to the issuance of this report in accordance with the standards of the American Academy of Sleep Medicine (AASM).    INTERPRETING PHYSICIAN:   Star Age, MD, PhD Medical Director, [facility]) [place], ABPN (Neurology and Sleep)  [facility] [address] [place], [place] [address] [phone]

## 2022-03-31 NOTE — Addendum Note (Signed)
Addended by: Star Age on: 03/31/2022 05:42 PM   Modules accepted: Orders

## 2022-04-08 ENCOUNTER — Telehealth: Payer: Self-pay

## 2022-04-08 NOTE — Telephone Encounter (Signed)
I called patient to discuss. No answer, left a message asking her to call us back. 

## 2022-04-08 NOTE — Telephone Encounter (Signed)
-----   Message from Star Age, MD sent at 03/31/2022  5:42 PM EST ----- Patient referred by Loletta Specter, NP, seen by me on 02/22/2022, patient had a HST on 03/30/2022.    Please call and notify the patient that the recent home sleep test showed obstructive sleep apnea in the severe range. I recommend treatment for this in the form of autoPAP, which means, that we don't have to bring her in for a sleep study with CPAP, but will let her start using a so called autoPAP machine at home, through a DME company (of her choice, or as per insurance requirement). The DME representative will fit the patient with a mask of choice, educate her on how to use the machine, how to put the mask on, etc. I have placed an order in the chart. Please send the order to a local DME, talk to patient, send report to referring MD. Please also reinforce the need for compliance with treatment. We will need a FU in sleep clinic for 10 weeks post-PAP set up, please arrange that with me or one of our NPs. Thanks,   Star Age, MD, PhD [facility])

## 2022-04-09 NOTE — Telephone Encounter (Signed)
Pt asking to be called on Monday by Cyril Mourning, RN

## 2022-04-12 NOTE — Telephone Encounter (Signed)
Pt returning call, would like a call back 

## 2022-04-13 NOTE — Telephone Encounter (Signed)
I called pt. I had a conversation with her lasting greater than 9 minutes. I advised pt that Dr. Rexene Alberts reviewed their sleep study results and found that pt has severe osa. Dr. Rexene Alberts recommends that pt start an auto-PAP at home. I reviewed PAP compliance expectations with the pt. Pt is agreeable to starting an auto-PAP. I advised pt that an order will be sent to a DME, [facility], and [facility] will call the pt within about one week after they file with the pt's insurance. [facility] will show the pt how to use the machine, fit for masks, and troubleshoot the auto-PAP if needed. A follow up appt was made for insurance purposes with Dr. Rexene Alberts on 07/06/2022 at 9:45am. Pt verbalized understanding to arrive 15 minutes early and bring their auto-PAP.  Pt verbalized understanding of results. Pt had no questions at this time but was encouraged to call back if questions arise. I have sent the order to [facility] and have received confirmation that they have received the order.

## 2022-04-26 ENCOUNTER — Encounter: Payer: Self-pay | Admitting: Family Medicine

## 2022-04-26 ENCOUNTER — Other Ambulatory Visit: Payer: Self-pay | Admitting: Family Medicine

## 2022-04-26 ENCOUNTER — Ambulatory Visit (INDEPENDENT_AMBULATORY_CARE_PROVIDER_SITE_OTHER): Payer: Medicare Other | Admitting: Family Medicine

## 2022-04-26 VITALS — BP 132/82 | HR 69 | Temp 97.0°F | Ht 61.0 in | Wt 146.0 lb

## 2022-04-26 DIAGNOSIS — R413 Other amnesia: Secondary | ICD-10-CM | POA: Diagnosis not present

## 2022-04-26 DIAGNOSIS — I10 Essential (primary) hypertension: Secondary | ICD-10-CM

## 2022-04-26 DIAGNOSIS — E782 Mixed hyperlipidemia: Secondary | ICD-10-CM | POA: Diagnosis not present

## 2022-04-26 DIAGNOSIS — Z0000 Encounter for general adult medical examination without abnormal findings: Secondary | ICD-10-CM

## 2022-04-26 DIAGNOSIS — F3342 Major depressive disorder, recurrent, in full remission: Secondary | ICD-10-CM

## 2022-04-26 DIAGNOSIS — R61 Generalized hyperhidrosis: Secondary | ICD-10-CM | POA: Diagnosis not present

## 2022-04-26 LAB — CBC WITH DIFFERENTIAL/PLATELET
Basophils Absolute: 0 10*3/uL (ref 0.0–0.1)
Basophils Relative: 0.5 % (ref 0.0–3.0)
Eosinophils Absolute: 0.3 10*3/uL (ref 0.0–0.7)
Eosinophils Relative: 2.9 % (ref 0.0–5.0)
HCT: 38.4 % (ref 36.0–46.0)
Hemoglobin: 13 g/dL (ref 12.0–15.0)
Lymphocytes Relative: 28.5 % (ref 12.0–46.0)
Lymphs Abs: 2.5 10*3/uL (ref 0.7–4.0)
MCHC: 34 g/dL (ref 30.0–36.0)
MCV: 91.4 fl (ref 78.0–100.0)
Monocytes Absolute: 0.7 10*3/uL (ref 0.1–1.0)
Monocytes Relative: 8.4 % (ref 3.0–12.0)
Neutro Abs: 5.2 10*3/uL (ref 1.4–7.7)
Neutrophils Relative %: 59.7 % (ref 43.0–77.0)
Platelets: 228 10*3/uL (ref 150.0–400.0)
RBC: 4.2 Mil/uL (ref 3.87–5.11)
RDW: 13.8 % (ref 11.5–15.5)
WBC: 8.7 10*3/uL (ref 4.0–10.5)

## 2022-04-26 LAB — COMPREHENSIVE METABOLIC PANEL
ALT: 11 U/L (ref 0–35)
AST: 13 U/L (ref 0–37)
Albumin: 4.3 g/dL (ref 3.5–5.2)
Alkaline Phosphatase: 62 U/L (ref 39–117)
BUN: 18 mg/dL (ref 6–23)
CO2: 26 mEq/L (ref 19–32)
Calcium: 9.5 mg/dL (ref 8.4–10.5)
Chloride: 107 mEq/L (ref 96–112)
Creatinine, Ser: 0.71 mg/dL (ref 0.40–1.20)
GFR: 84.41 mL/min (ref >60.00)
Glucose, Bld: 92 mg/dL (ref 70–99)
Potassium: 3.9 mEq/L (ref 3.5–5.1)
Sodium: 137 mEq/L (ref 135–145)
Total Bilirubin: 0.6 mg/dL (ref 0.2–1.2)
Total Protein: 7.3 g/dL (ref 6.0–8.3)

## 2022-04-26 LAB — LIPID PANEL
Cholesterol: 207 mg/dL — ABNORMAL HIGH (ref 0–200)
HDL: 63.7 mg/dL (ref >39.00)
LDL Cholesterol: 125 mg/dL — ABNORMAL HIGH (ref 0–99)
NonHDL: 143.37
Total CHOL/HDL Ratio: 3
Triglycerides: 92 mg/dL (ref 0.0–149.0)
VLDL: 18.4 mg/dL (ref 0.0–40.0)

## 2022-04-26 LAB — VITAMIN B12: Vitamin B-12: 369 pg/mL (ref 211–911)

## 2022-04-26 LAB — TSH: TSH: 2.5 u[IU]/mL (ref 0.35–5.50)

## 2022-04-26 LAB — VITAMIN D 25 HYDROXY (VIT D DEFICIENCY, FRACTURES): VITD: 22.12 ng/mL — ABNORMAL LOW (ref 30.00–100.00)

## 2022-04-26 LAB — SEDIMENTATION RATE: Sed Rate: 12 mm/hr (ref 0–30)

## 2022-04-26 LAB — C-REACTIVE PROTEIN: CRP: <1 mg/dL (ref 0.5–20.0)

## 2022-04-26 MED ORDER — AMLODIPINE BESYLATE-VALSARTAN 5-320 MG PO TABS: 1.0000 | ORAL_TABLET | Freq: Every day | ORAL | 3 refills | Status: DC

## 2022-04-26 MED ORDER — VITAMIN D (ERGOCALCIFEROL) 1.25 MG (50000 UNIT) PO CAPS: 50000.0000 [IU] | ORAL_CAPSULE | ORAL | 1 refills | Status: DC

## 2022-04-26 MED ORDER — EZETIMIBE 10 MG PO TABS: 10.0000 mg | ORAL_TABLET | Freq: Every day | ORAL | 3 refills | Status: DC

## 2022-04-26 NOTE — Patient Instructions (Addendum)
Please stop by lab before you go If you have mychart- we will send your results within 3 business days of [place] receiving them.  If you do not have mychart- we will call you about results within 5 business days of [place] receiving them.  *please also note that you will see labs on mychart as soon as they post. I will later go in and write notes on them- will say "notes from Dr. Yong Channel"   Recommended follow up: Return in about 7 months (around 11/26/2022) for followup or sooner if needed.Schedule b4 you leave.

## 2022-04-26 NOTE — Progress Notes (Signed)
Phone [phone]   Subjective:  Patient presents today for their annual physical. Chief complaint-noted.   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   Required high dose PPI, now on prilosec     GAD (generalized anxiety disorder)    GERD (gastroesophageal reflux disease)    Glaucoma    Hyperlipidemia    Hypertension    Insomnia    MCI (mild cognitive impairment)    Snoring 03/08/2008   Tested 2007 per patient-unrevealing for sleep apnea     Patient Active Problem List   Diagnosis Date Noted   CKD (chronic kidney disease), stage III 03/14/2014    Priority: Medium    Glaucoma 03/08/2014    Priority: Medium    Insomnia 03/08/2014    Priority: Medium    Hyperglycemia 12/09/2009    Priority: Medium    Hyperlipidemia 10/13/2006    Priority: Medium    Major depression, recurrent, full remission 10/13/2006    Priority: Medium    Hypertension, essential 10/10/2006    Priority: Medium    Normocytic anemia 09/12/2014    Priority: Low   Stress incontinence, female 10/12/2011    Priority: Low   Vaginal atrophy 05/27/2011    Priority: Low   Obesity 10/11/2008    Priority: Low   GERD- failed h2 blocker 08/25/2007    Priority: Low   Osteoarthritis of ankle and foot 12/08/2018   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   Past Surgical History:  Procedure Laterality Date   CATARACT EXTRACTION     late 2019   COLONOSCOPY     COLPOSCOPY     age 53   GYNECOLOGIC CRYOSURGERY     age 64   HAMMER TOE SURGERY     TENDON REPAIR Left    TUBAL LIGATION      Family History  Problem Relation Age of Onset   Alzheimer's disease Mother    Hip fracture  Mother    Osteoporosis Mother    Deep vein thrombosis Father    Heart disease Father        MI 68s, nonsmoker   Diabetes Sister    Hypertension Sister    Breast cancer Sister        55s   Arthritis Sister    Hypertension Brother    Diabetes Maternal Grandmother    Stroke Maternal Grandfather    Depression Paternal Grandmother    Suicidality Paternal Grandfather    Depression Paternal Grandfather    Colon cancer Neg Hx     Medications- reviewed and updated Current Outpatient Medications  Medication Sig Dispense Refill   amLODipine-valsartan (EXFORGE) 5-320 MG tablet Take 1 tablet by mouth daily. 90 tablet 3   aspirin EC 81 MG tablet Take 1 tablet (81 mg total) by mouth daily. Swallow whole. 30 tablet 12   ezetimibe (ZETIA) 10 MG tablet Take 1 tablet (10 mg total) by mouth daily. 30 tablet 5   fluticasone (FLONASE) 50 MCG/ACT nasal spray SPRAY 2 SPRAYS INTO EACH NOSTRIL EVERY DAY 48 mL 3   latanoprost (XALATAN) 0.005 % ophthalmic solution 1 drop at bedtime.     Multiple Vitamin (MULTIVITAMIN) tablet Take 1 tablet by  mouth daily.     omeprazole (PRILOSEC) 20 MG capsule Take 20 mg by mouth daily.     ramelteon (ROZEREM) 8 MG tablet Take 8 mg by mouth at bedtime.     timolol (TIMOPTIC) 0.5 % ophthalmic solution Place 1 drop into both eyes 2 (two) times daily.  0   No current facility-administered medications for this visit.    Allergies-reviewed and updated Allergies  Allergen Reactions   Amlodipine Besy-Benazepril Hcl Other (See Comments)    Edema to ankle with redness/ but now she is tolerating the current regiment   Pravastatin     myalgias   Rosuvastatin     Severe fatigue when went to daily     Social History   Social History Narrative   Married (husband patient at [facility]). 3 children. 7 grandkids.       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       Hobbies: time with grandkids, walk when weather nice, reading, tv       Caffiene: 2 cups am, decaf occasional at night.    Objective  Objective:  BP 132/82   Pulse 69   Temp (!) 97 F (36.1 C)   Ht 5\' 1"  (1.549 m)   Wt 146 lb (66.2 kg)   SpO2 97%   BMI 27.59 kg/m  Gen: NAD, resting comfortably HEENT: Mucous membranes are moist. Oropharynx normal Neck: no thyromegaly CV: RRR no murmurs rubs or gallops Lungs: CTAB no crackles, wheeze, rhonchi Abdomen: soft/nontender/nondistended/normal bowel sounds. No rebound or guarding.  Ext: no edema Skin: warm, dry Neuro: grossly normal, moves all extremities, PERRLA   Assessment and Plan   72 y.o. female presenting for annual physical.  Health Maintenance counseling: 1. Anticipatory guidance: Patient counseled regarding regular dental exams -q6 months, eye exams - yearly,  avoiding smoking and second hand smoke, limiting alcohol to 1 beverage per day- doesn't drink , no illicit drugs .   2. Risk factor reduction:  Advised patient of need for regular exercise and diet rich and fruits and vegetables to reduce risk of heart attack and stroke.  Exercise- not exercising - not really getting outside- advised even starting with 5 minutes.  Diet/weight management-down 22 lbs in last year- had cut down on doritos/chips/unhealthy snacks- but weight has stabilized- should let me know if unintentional weight loss..  Wt Readings from Last 3 Encounters:  04/26/22 146 lb (66.2 kg)  03/17/22 146 lb (66.2 kg)  02/22/22 144 lb 9.6 oz (65.6 kg)  3. Immunizations/screenings/ancillary studies- declines shingrix  Immunization History  Administered Date(s) Administered   Fluad Quad(high Dose 65+) 11/10/2020   Influenza,inj,Quad PF,6+ Mos 11/15/2012, 10/30/2015, 10/12/2019   Influenza-Unspecified 09/25/2013, 11/10/2014, 10/29/2016   PFIZER(Purple Top)SARS-COV-2 Vaccination 02/15/2019, 03/08/2019, 11/09/2019   Pfizer Covid-19 Vaccine Bivalent Booster 33yrs & up 11/10/2020   Pneumococcal Conjugate-13 03/08/2014   Pneumococcal  Polysaccharide-23 03/13/2015   Td 01/26/2003   Tdap 10/16/2015   Zoster, Live 12/12/2009  4. Cervical cancer screening- Dr. Marvel Plan still does these at times- past formal age based screening requirements 5. Breast cancer screening-  breast exam with GYN  and mammogram 09/25/21 with a 1 year repeat planned 6. Colon cancer screening - 08/09/12 with a 10 year repeat planned-due later this year 7. Skin cancer screening-  Dr. Martin Majestic [place] derm. advised regular sunscreen use.  8. Birth control/STD check-  monogamous and post menopause 9. Osteoporosis screening at 35- DEXA 05/31/19 was normal -never smoker   Status  of chronic or acute concerns   # Memory loss-following with neurology closely-has had DAT scan and PET metabolic which do not clearly point toward cause  -improvemetn at neuro visit 03/17/22- felt possibly related to reducing psychiatric meds -also pending CPAP fitting  #Hypertension/CKD stage III S: Compliant with amlodipine-valsartan 5-320 mg.  Knows to avoid NSAIDs-Celebrex prescribed by Dr. Milinda Pointer in past is no longer on  Valsartan is protective in case there is a proteinuric element. BP Readings from Last 3 Encounters:  04/26/22 132/82  03/29/22 120/64  03/17/22 (!) 147/89  A/P: high acceptable blood pressure but controlled- continue current medications  CKD III likely stable- update cmp   #Hyperlipidemia #concern for TIA 05/26/21- with aphasia- MRI, MRA, holter, echo largely reassuring other than LVH (cardiology saw) S: medication: Trouble with statins, aspirin 81 mg daily , trial zetia 10 mg 2024 -07/19/21 reported more fatigue and less pain from typical pains when starting this dose up from rosuvastatin 15 mg a week- had opted to try off 2 weeks and  noted some improvement-staying off -  Myalgias on pravastatin in the past. Lab Results  Component Value Date   CHOL 185 05/26/2021   HDL 52.20 05/26/2021   LDLCALC 113 (H) 05/26/2021   LDLDIRECT 131.0 02/18/2022   TRIG 100.0  05/26/2021   CHOLHDL 4 05/26/2021  A/P: lipids prefer LDL at least under 100 if not under 70- update panel- consider once a week atorvastatin - but cautious with prior fatigue/pain/myalgias- would likely only use weekly     #Hyperglycemia S: Increased risk of diabetes.  No current medication.  Lifestyle modification only. Lab Results  Component Value Date   HGBA1C 6.0 02/18/2022   HGBA1C 6.0 04/23/2021   HGBA1C 6.2 10/09/2020  A/P: stable- continue without medicines  - work on healthy eating and regular exercise    #GERD S: Has been well controlled on Prilosec as needed. zantac was not helpful for her.  A/P: stable- continue current medicines    #Depression in full remission S: Compliant with ramelteon 8 mg. Off all other medicines. Still working with [facility]- they do not feel further medication changes would be so beneficial -if she has interest will get up but with low interest low motivation. Not staying in bed as long lying awake in bed.     02/18/2022   10:52 AM 11/06/2021    9:39 AM 09/16/2021   10:39 AM  Depression screen PHQ 2/9  Decreased Interest 0 0 0  Down, Depressed, Hopeless 0 0 0  PHQ - 2 Score 0 0 0  Altered sleeping 0  1  Tired, decreased energy 0  2  Change in appetite 0  0  Feeling bad or failure about yourself  0  0  Trouble concentrating 0  0  Moving slowly or fidgety/restless 0  1  Suicidal thoughts 0  0  PHQ-9 Score 0  4  Difficult doing work/chores Not difficult at all    A/P: reasonable control- still less motivation than desired and husband reports depressed mood at home- continue close follow up with psychiatry -also pending CPAP set up- getting set up soon she reports - with fatigue TSH was normal in January  # Night sweats S:reports these started back sometime after Christmas- we did have labs in January with no obvious cause- did not have TSH or ESR at that time  -prior weight loss has stabilized- up 1 lb  - husband reports he has had  similar issues- had reassuring workup-  medicine tweaks helped him - really just mildly damp in legs A/P: issues are mild in regard sto night sweats and no further weight loss. Does have fatigue but wonder if depression related.  - will check esr and crp and if elevated -ppi could contribute  Recommended follow up: Return in about 6 months (around 10/26/2022) for followup or sooner if needed.Schedule b4 you leave. Future Appointments  Date Time Provider [place]  07/06/2022  9:45 AM Star Age, MD [facility] None  11/01/2022  1:30 PM Genia Harold, MD [facility] None   Lab/Order associations:NOT fasting   ICD-10-CM   1. Preventative health care  Z00.00 CBC with Differential/Platelet    Comprehensive metabolic panel    2. Memory loss  R41.3 Vitamin B12    VITAMIN D 25 Hydroxy (Vit-D Deficiency, Fractures)    TSH    3. Hypertension, essential  I10     4. Night sweats  R61 Sedimentation rate    C-reactive protein    5. Mixed hyperlipidemia  E78.2     6. Major depression, recurrent, full remission Chronic F33.42       No orders of the defined types were placed in this encounter.   Return precautions advised.  Garret Reddish, MD

## 2022-07-06 ENCOUNTER — Ambulatory Visit: Payer: Medicare Other | Admitting: Neurology

## 2022-07-06 ENCOUNTER — Encounter: Payer: Self-pay | Admitting: Neurology

## 2022-07-06 VITALS — BP 133/81 | HR 70 | Ht 61.0 in | Wt 148.8 lb

## 2022-07-06 DIAGNOSIS — Z789 Other specified health status: Secondary | ICD-10-CM | POA: Diagnosis not present

## 2022-07-06 DIAGNOSIS — G4734 Idiopathic sleep related nonobstructive alveolar hypoventilation: Secondary | ICD-10-CM

## 2022-07-06 DIAGNOSIS — G4733 Obstructive sleep apnea (adult) (pediatric): Secondary | ICD-10-CM

## 2022-07-06 NOTE — Patient Instructions (Addendum)
Please continue using your autoPAP regularly. While your insurance requires that you use PAP at least 4 hours each night on 70% of the nights, I recommend, that you not skip any nights and use it throughout the night if you can. Getting used to PAP and staying with the treatment long term does take time and patience and discipline. Untreated obstructive sleep apnea when it is moderate to severe can have an adverse impact on cardiovascular health and raise her risk for heart disease, arrhythmias, hypertension, congestive heart failure, stroke and diabetes. Untreated obstructive sleep apnea causes sleep disruption, nonrestorative sleep, and sleep deprivation. This can have an impact on your day to day functioning and cause daytime sleepiness and impairment of cognitive function, memory loss, mood disturbance, and problems focussing. Using PAP regularly can improve these symptoms.  Please make an appointment with your DME provider for mask fit.  You may be able to use a different style or size of mask.   As discussed, we will do an overnight oxygen level test, called ONO, and your DME company will call and set this up for one night, while you also use your autoPAP as usual. We will call you with the results. This is to make sure that your oxygen levels stay in the 90s, while you are treated with autoPAP for your OSA. Remember, your oxygen levels dropped into the 50s during the home sleep test.   If you have ongoing lower oxygen desaturations while on autoPAP therapy, we will consider bringing you in for an actual sleep study in our sleep lab.   Please follow-up with one of our nurse practitioners in sleep clinic in about 6 months, sooner if needed.  Please keep your appointment with Dr. Delena Bali as planned.

## 2022-07-06 NOTE — Progress Notes (Signed)
Subjective:    Patient ID: Ann Washington is a 72 y.o. female.  HPI    Interim history:   [name] is a 72 year old female with an underlying medical history of hypertension, hyperlipidemia, memory loss, anxiety, depression, reflux disease, glaucoma, and overweight state, who presents for follow-up consultation of her obstructive sleep apnea after interim testing and starting home AutoPap therapy.  The patient is unaccompanied today (her husband had to leave to go to another appointment). I first met her at the request of her primary care provider on 02/22/2022, which time she reported snoring and difficulty sleeping.  She was advised to proceed with a sleep study.  She had a home sleep test on 03/30/2022 which indicated severe obstructive sleep apnea with an AHI of 56.1/h, O2 nadir 52% with time below or at 88% saturation of over 18 minutes for the night.  Snoring ranged from mild to moderate.  She was advised to proceed with home AutoPap therapy.  Her set up date was 04/27/2022.  She has a ResMed air sense 10 AutoSet machine.  Her DME company is [employer] [facility].  Today, 07/06/2022: I reviewed her AutoPap compliance data from 06/05/2022 through 07/04/2022, which is a total of 30 days, during which time she used her machine every night with percent use days greater than 4 hours at 90%, indicating excellent compliance with an average usage of 6 hours and 45 minutes, residual AHI at goal at 1.3/h, 95th percentile of pressure at 10.8 cm with a range of 5 to 11 cm with EPR of 3.  Leak on the higher side most nights with a 95th percentile at 30.5 L/min.  She reports still adjusting to treatment, she finds the pressure tolerable but the mask is uncomfortable and dislodges, she has to adjust it, it leaks.  She has not gotten in touch with her DME provider yet.  She uses an under the nose nasal cushion interface.  She feels that the temperature is too high, has not been able to down adjust the temperature and would  like a cool humidity.  She is willing to make an appointment with her DME provider for mask fit and continue to work on using her machine.  We talked about her home sleep test results and reviewed her compliance data in detail today.  The patient's allergies, current medications, family history, past medical history, past social history, past surgical history and problem list were reviewed and updated as appropriate.   Previously:   (She) reports a longstanding history of difficulty initiating and maintaining sleep.  Per husband, she does snore and sometimes it is loud.  She has been on Ambien for years, she stopped the Ambien with some 15 years ago on her own.  Per patient, she was having issues with overmedication and in 2003 or 2004 she was hospitalized for few days due to overmedication or accidental overdose. Her Epworth sleepiness score is 6/24, fatigue severity score is 51 out of 63.  She is not aware of any sleep apnea and her family.  She goes to bed between 11 PM and midnight and rise time is between 9:30 AM and 11:30 AM typically.  She does not have nightly nocturia and denies any recurrent nocturnal or morning headaches.  She drinks caffeine in limitation, typically 1 cup of coffee in the morning, no alcohol currently, she is a non-smoker.  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  She lives with her husband, no pets in the household, they have 3 grown children.  She does have a TV in her bedroom but does not have it on all night and does not have it on each night.  I reviewed your office visit note from 10/17/2021.  She is currently on trazodone as needed for sleep, 50 mg strength 1 to 2 tablets at bedtime.  She has previously tried other medications to help her sleep including mirtazapine, zolpidem, and now on ramelteon.  She sees Dr. Delena Bali for memory loss.  She is no longer on Lexapro or Wellbutrin.    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   Required high Ann Washington PPI, now on prilosec     GAD (generalized anxiety disorder)    GERD (gastroesophageal reflux disease)    Glaucoma    Hyperlipidemia    Hypertension    Insomnia    MCI (mild cognitive impairment)    Snoring 03/08/2008   Tested 2007 per patient-unrevealing for sleep apnea      Her Past Surgical History Is Significant For: Past Surgical History:  Procedure Laterality Date   CATARACT EXTRACTION     late 2019   COLONOSCOPY     COLPOSCOPY     age 7   GYNECOLOGIC CRYOSURGERY     age 67   HAMMER TOE SURGERY     TENDON REPAIR Left    TUBAL LIGATION      Her Family History Is Significant For: Family History  Problem Relation Age of Onset   Alzheimer's disease Mother    Hip fracture Mother    Osteoporosis Mother    Deep vein thrombosis Father    Heart disease Father        MI 27s, nonsmoker   Diabetes Sister    Hypertension Sister    Breast cancer Sister        54s   Arthritis Sister    Hypertension Brother    Diabetes Maternal Grandmother    Stroke Maternal Grandfather    Depression Paternal Grandmother    Suicidality Paternal Grandfather    Depression Paternal Grandfather    Colon cancer Neg Hx     Her Social History Is Significant For: Social History   Socioeconomic History   Marital status: Married    Spouse name: Ann Washington   Number of children: 3   Years of education: Not on file   Highest education level: Some college, no degree  Occupational History   Not on file  Tobacco Use   Smoking status: Never   Smokeless tobacco: Never  Vaping Use   Vaping Use: Never used  Substance and Sexual Activity   Alcohol use: No   Drug use: No   Sexual activity: Yes    Birth control/protection: Surgical  Other Topics Concern   Not on file  Social History  Narrative   Married (husband patient at [facility]). 3 children. 7 grandkids.       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       Hobbies: time with grandkids, walk when weather nice, reading, tv      Caffiene: 2 cups am, decaf occasional at night.    Social Determinants of Health   Financial Resource Strain: Low Risk  (11/06/2021)  Overall Financial Resource Strain (CARDIA)    Difficulty of Paying Living Expenses: Not hard at all  Food Insecurity: No Food Insecurity (11/06/2021)   Hunger Vital Sign    Worried About Running Out of Food in the Last Year: Never true    Ran Out of Food in the Last Year: Never true  Transportation Needs: No Transportation Needs (11/06/2021)   PRAPARE - Administrator, Civil Service (Medical): No    Lack of Transportation (Non-Medical): No  Physical Activity: Inactive (11/06/2021)   Exercise Vital Sign    Days of Exercise per Week: 0 days    Minutes of Exercise per Session: 0 min  Stress: No Stress Concern Present (11/06/2021)   [employer] of Occupational Health - Occupational Stress Questionnaire    Feeling of Stress : Not at all  Social Connections: Moderately Integrated (11/06/2021)   Social Connection and Isolation Panel [NHANES]    Frequency of Communication with Friends and Family: More than three times a week    Frequency of Social Gatherings with Friends and Family: More than three times a week    Attends Religious Services: More than 4 times per year    Active Member of [employer] or Organizations: No    Attends Banker Meetings: Never    Marital Status: Married    Her Allergies Are:  Allergies  Allergen Reactions   Amlodipine Besy-Benazepril Hcl Other (See Comments)    Edema to ankle with redness/ but now she is tolerating the current regiment   Pravastatin     myalgias   Rosuvastatin     Severe fatigue when went to daily   :   Her Current Medications Are:   Outpatient Encounter Medications as of 07/06/2022  Medication Sig   amLODipine-valsartan (EXFORGE) 5-320 MG tablet Take 1 tablet by mouth daily.   aspirin EC 81 MG tablet Take 1 tablet (81 mg total) by mouth daily. Swallow whole.   Cyanocobalamin (B-12 PO) Take by mouth.   ezetimibe (ZETIA) 10 MG tablet Take 1 tablet (10 mg total) by mouth daily.   fluticasone (FLONASE) 50 MCG/ACT nasal spray SPRAY 2 SPRAYS INTO EACH NOSTRIL EVERY DAY   latanoprost (XALATAN) 0.005 % ophthalmic solution 1 drop at bedtime.   Multiple Vitamin (MULTIVITAMIN) tablet Take 1 tablet by mouth daily.   omeprazole (PRILOSEC) 20 MG capsule Take 20 mg by mouth daily.   ramelteon (ROZEREM) 8 MG tablet Take 8 mg by mouth at bedtime.   Vitamin D, Ergocalciferol, (DRISDOL) 1.25 MG (50000 UNIT) CAPS capsule Take 1 capsule (50,000 Units total) by mouth every 7 (seven) days.   timolol (TIMOPTIC) 0.5 % ophthalmic solution Place 1 drop into both eyes 2 (two) times daily.   No facility-administered encounter medications on file as of 07/06/2022.  :  Review of Systems:  Out of a complete 14 point review of systems, all are reviewed and negative with the exception of these symptoms as listed below:   Review of Systems  Neurological:        Pt here for CPAP f/u Pt states not during well with mask pt states not able to sleep on her side Pt states mask always moves  around and she hears air leaking    ESS:6    Objective:  Neurological Exam  Physical Exam Physical Examination:   Vitals:   07/06/22 0957  BP: 133/81  Pulse: 70    General Examination: The patient is a very pleasant 72 y.o. female in no  acute distress. She appears well-developed and well-nourished and well groomed.   HEENT: Normocephalic, atraumatic, pupils are equal, round and reactive to light, extraocular tracking is good without limitation to gaze excursion or nystagmus noted. Hearing is grossly intact. Face is symmetric with normal facial animation.  Speech is clear with no dysarthria noted. There is no hypophonia. There is no lip, neck/head, jaw or voice tremor. Neck is supple with full range of passive and active motion. There are no carotid bruits on auscultation. Oropharynx exam reveals: mild mouth dryness, adequate dental hygiene and mild airway crowding.  Tongue protrudes centrally.     Chest: Clear to auscultation without wheezing, rhonchi or crackles noted.   Heart: S1+S2+0, regular and normal without murmurs, rubs or gallops noted.    Abdomen: Soft, non-tender and non-distended.   Extremities: There is no pitting edema in the distal lower extremities bilaterally.    Skin: Warm and dry without trophic changes noted.    Musculoskeletal: exam reveals no obvious joint deformities.    Neurologically:  Mental status: The patient is awake, alert and oriented, able to provide her history.  She did ask a few questions repeatedly.   There is no evidence of aphasia, agnosia, apraxia or anomia. Speech is clear with normal prosody and enunciation. Thought process is linear. Mood is normal and affect is normal.  Cranial nerves II - XII are as described above under HEENT exam.  Motor exam: Normal bulk, strength and tone is noted. There is no obvious action or resting tremor.  Fine motor skills and coordination: grossly intact.  Cerebellar testing: No dysmetria or intention tremor. There is no truncal or gait ataxia.  Sensory exam: intact to light touch in the upper and lower extremities.  Gait, station and balance: She stands easily. No veering to one side is noted. No leaning to one side is noted. Posture is mildly stooped for age, she walks without a walking aid.     Assessment and Plan:  In summary, CARLISS BLATT is a very pleasant 72 year old female with an underlying medical history of hypertension, hyperlipidemia, memory loss, anxiety, depression, reflux disease, glaucoma, and overweight state, who presents for follow-up consultation  of her obstructive sleep apnea after interim testing and starting home AutoPap therapy.  She had a home sleep test on 03/30/2022 which indicated severe obstructive sleep apnea with an AHI of 56.1/h, O2 nadir 52% with time below or at 88% saturation of over 18 minutes for the night.  Snoring ranged from mild to moderate.  She has been on home AutoPap therapy since 04/27/2022.  She has a ResMed air sense 10 AutoSet machine.  Her DME company is [facility].  She is compliant with treatment but struggles with tolerating the interface and the temperature of the year.  She is advised to make an appointment for mask refit with her DME provider to discuss mask options and to learn how to adjust humidity and temperature the air.  I would like to go ahead with an overnight pulse oximetry test as she had severe desaturations.  I explained the pulse ox test to her.  She is advised to continue to use her AutoPap regularly, we talked about her sleep test results in detail and reviewed her compliance data in detail today.  She may do a little better with a different interface.  She is advised that we will call her with her pulse oximetry test results.  If abnormal, we will probably consider bringing her in for a  formal titration study.  If all goes well, and the pulse ox shows better oxygen saturations while on AutoPap therapy, she can follow-up in about 6 months to see one of our nurse practitioners.  She is also advised to follow-up with Dr. Delena Bali as scheduled.  Once she is stable on AutoPap therapy or CPAP therapy, we can see her yearly in sleep clinic.  I answered all her questions today and she was in agreement with our plan. I spent 40 minutes in total face-to-face time and in reviewing records during pre-charting, more than 50% of which was spent in counseling and coordination of care, reviewing test results, reviewing medications and treatment regimen and/or in discussing or reviewing the diagnosis of OSA, nocturnal  hypoxemia, the prognosis and treatment options. Pertinent laboratory and imaging test results that were available during this visit with the patient were reviewed by me and considered in my medical decision making (see chart for details).

## 2022-08-09 ENCOUNTER — Telehealth: Payer: Self-pay | Admitting: Neurology

## 2022-08-09 NOTE — Telephone Encounter (Signed)
CM sent to [facility]

## 2022-08-09 NOTE — Telephone Encounter (Signed)
Pt stated she talked to [employer] [facility] and she needs a new order for a nasal pillow mask. Stated this mask will for better for her.

## 2022-08-10 NOTE — Telephone Encounter (Signed)
New, Ann Washington, Abbe Amsterdam, CMA; Ellis Parents Eula Fried; Kathe Becton Received, thank you!       Previous Messages    ----- Message ----- From: Bobbye Morton, CMA Sent: 08/09/2022   3:44 PM EDT To: Penni Homans; Santina Evans; * Subject: Mask refit                                    New orders were placed for the above pt, DOB: 11/27/1950, during last visit 6/11. She is needing a new mask. Thanks

## 2022-08-16 ENCOUNTER — Other Ambulatory Visit: Payer: Self-pay | Admitting: Family Medicine

## 2022-08-19 ENCOUNTER — Telehealth: Payer: Self-pay | Admitting: Neurology

## 2022-08-19 NOTE — Telephone Encounter (Signed)
Please double check with DME company, [facility] why the timing is incorrect on the sensor, showing 3 PM to 8:30 PM.  That is confusing and she should line up with when the sensor is actually being used.  Also verify on her AutoPap compliance download if you can what time she was on her AutoPap on 08/12/2022.

## 2022-08-19 NOTE — Telephone Encounter (Signed)
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

## 2022-08-19 NOTE — Telephone Encounter (Signed)
Okay, thank you for the clarification. She can FU as scheduled.

## 2022-08-19 NOTE — Telephone Encounter (Signed)
I contacted DME and was told "the pt probably just pushed a button and messed up the time stamp. There is no need to repeat it just because of a time stamp, as long as the date is correct because then the pt will have to pay out of pocket. We have to reset times all the time they bring them back in"

## 2022-08-19 NOTE — Telephone Encounter (Signed)
I received patient's pulse oximetry report.  Test date was 08/12/2022 but time was 3 PM through 8:35 PM for a total duration of 5 hours and 35 minutes.  Testing conditions was documented as room air with CPAP.   Average oxygen saturation was 93%, nadir was 87% with time below or at 88% saturation of 3 minutes and 32 seconds.  Please call patient and inquire if she slept from 3 PM to 8:35 PM with her AutoPap on 08/12/2022?  She usually sleeps at night, from what she had told me. Please verify test conditions.  We may have to repeat an ONO on autoPAP.

## 2022-08-19 NOTE — Telephone Encounter (Signed)
Contacted pt back, she stated she did not put it on at 3 pm. She said she had it on at about 11p-12, it came off and she put it back on but it unsure what time she put it back on. I asked her if it is a possibility that she put it back on at 3AM and not PM, and took it off ay 835AM. She did state that she could have, she did leave the o2 sensor on a little longer to try to make up for the time she did not have it on when it fell off.  I did review with her, from this information Average oxygen saturation was 93%, nadir was 87% with time below or at 88% saturation of 3 minutes and 32 seconds. MD may want to repeat. She verbally understood and was appreciative.

## 2022-09-17 ENCOUNTER — Encounter: Payer: Self-pay | Admitting: Family Medicine

## 2022-09-20 NOTE — Telephone Encounter (Signed)
OV needed or is she doing right with Flonase?

## 2022-10-01 ENCOUNTER — Other Ambulatory Visit: Payer: Self-pay | Admitting: Family Medicine

## 2022-10-01 DIAGNOSIS — Z1231 Encounter for screening mammogram for malignant neoplasm of breast: Secondary | ICD-10-CM

## 2022-10-21 ENCOUNTER — Ambulatory Visit: Payer: Medicare Other | Admitting: Psychiatry

## 2022-10-21 ENCOUNTER — Encounter: Payer: Self-pay | Admitting: Psychiatry

## 2022-10-21 VITALS — BP 155/90 | HR 97 | Ht 61.0 in | Wt 150.0 lb

## 2022-10-21 DIAGNOSIS — R413 Other amnesia: Secondary | ICD-10-CM

## 2022-10-21 DIAGNOSIS — G4733 Obstructive sleep apnea (adult) (pediatric): Secondary | ICD-10-CM | POA: Diagnosis not present

## 2022-10-21 NOTE — Patient Instructions (Addendum)
Coenzyme Q10: This is present in almost all cells in the body and is critical component for the conversion of energy.  Recent studies have shown that a nutritional supplement of CoQ10 can reduce the frequency of migraine attacks by improving the energy production of cells as with riboflavin.  Doses of 150 mg twice a day or 300 mg in the morning have been shown to be effective.   What are oral appliances for sleep apnea? Oral appliances for sleep apnea are dental devices, or mouthpieces, that you wear in your mouth to keep your airway open while you sleep. Healthcare providers use them to treat obstructive sleep apnea (OSA).  With OSA, your upper airway is blocked partly or completely when you're sleeping. The reduced airflow triggers your brain to wake you up just enough to keep breathing. The cycle of restricted airflow, waking and falling asleep again, continues throughout the night. These episodes stop you from sleeping soundly and prevent your vital organs from getting enough oxygen. Untreated, OSA can lead to serious health conditions and is potentially fatal.  Oral appliances, also called oral appliance therapy, treat OSA. They hold your mouth in a position that makes sure you get enough airflow. They help you breathe better and reduce how often OSA wakes you up.  Types of oral appliances used to treat obstructive sleep apnea (OSA) There are two main types of dental appliances for treating OSA: mandibular advancement devices (MADs) and tongue-stabilizing devices (TSDs).  Mandibular advancement devices Mandibular advancement devices (MADs) are the most common oral appliance for treating OSA. They're also called mandibular advancement splints, mandibular advancement appliances or mandibular repositioning appliances.  MADs work by pulling your lower jaw (mandible) forward. Moving your jaw forward also pulls your tongue forward, creating more space for airflow in the back of your throat. MADs fit  over your teeth. Most come in two parts, one for your top teeth and one for your bottom. Screws, hinges and adapted rubber bands connect the top and bottom, allowing you to pull the lower part of the device that controls your jaw forward. Others have independent interchangeable arches.  MADs vary in terms of:  Size. The materials they're made of (how firm or soft they are). How the top and bottom pieces connect (in two-piece models). How much your teeth touch when you close your mouth. How far the device moves your jaw.  Tongue-stabilizing devices Tongue-stabilizing devices (TSDs) are also called tongue-retaining devices. They treat OSA by pulling your tongue forward using suction. A suction bulb on the device holds your tongue in place while you sleep. The tip of a TSD stays outside of your mouth. Like MADs, TSDs open your airway by moving the base of your tongue forward.  TSDs aren't as popular as MADs, but they may be an option for people with OSA who want to try an oral appliance but can't use MADs. For example, people whose teeth aren't strong enough to hold a MAD in place may use a TSD instead. These are also available over the counter.  What is the most effective oral appliance for sleep apnea? The most effective oral appliances are custom-made to fit your mouth. You can buy oral appliances without a prescription, including "boil and bite" models that make an impression of your teeth. These types may provide benefits, but research shows that custom-made oral appliances offer the best results for OSA.  How does oral appliance therapy work to treat obstructive sleep apnea? Oral appliances pull the base of  your tongue forward, creating more space in the back of your throat. Some types latch onto your tongue and pull it forward. Most pull your lower jaw forward while your tongue remains in place.  You put the device in before you go to sleep and wear it throughout the night. You remove it in  the morning when you wake up.  Who are oral appliances for OSA best for? Oral appliances work best for people with mild to moderate OSA who can't use a CPAP (continuous positive airway pressure) machine. A CPAP is a machine that attaches to your airway through a tube and mask you wear at night that sends pressurized air through your airways, keeping them open.  While CPAP is the best treatment for improving airflow for people with sleep apnea, many find it challenging to use night after night. Over time, they stop. Oral appliance therapy isn't as effective as CPAP, but it does improve symptoms in some people.  Other people use oral appliances and CPAP together. For example, you may use a CPAP machine at home but an oral appliance when traveling. You may use an oral appliance with a CPAP machine to decrease the amount of pressurized air you're getting from the CPAP.  Who shouldn't use an oral appliance for sleep apnea? Oral appliances aren't for everyone, including:  People with central sleep apnea: Central sleep apnea happens when signaling problems in your brain cause breathing pauses. Oral appliances can't treat this type of apnea.  People with severe OSA: Attempting to manage severe OSA with only an oral appliance can be dangerous. Follow your healthcare provider's guidance on when it's safe to use an oral appliance and when it isn't.   Procedure Details How do I get an oral appliance for obstructive sleep apnea? You'll need a sleep study to diagnose your OSA. If your sleep specialist decides an oral appliance is a good option, you'll need a referral to a dentist.  To fit you for a MAD (the most common type of oral appliance), the dentist will:  Take an impression of your teeth or a 3D scan and a bite relationship. Send it to a manufacturer, who'll follow your dentist's custom design so the device fits your teeth. Fit you for the device during a return visit. The dentist will ensure it  fits comfortably and won't slip out of place while you sleep. Provide instructions on how to clean and care for your appliance. The dentist may recommend you have a sleep test after you get your MAD to see how the appliance is working. Based on the results, you may need further adjustments to your appliance.  How long does it take to get used to an oral appliance? It takes about two to four weeks to adjust. It may feel odd at first to have a mouthpiece when you sleep. Try wearing it for only a few hours at first and gradually extending how long you have it in. For most people who use them, wearing an oral appliance eventually becomes part of their nighttime routine.   Risks / Benefits What are the side effects of oral appliance therapy for OSA? You may notice side effects during the first week or two while you're adjusting. Symptoms usually improve once you get used to wearing your appliance.  Short-term side effects include:  Salivating (producing more spit) while you have the appliance in. Pain in your jaw, teeth and temporomandibular joint (TMJ). Dry mouth. Gum irritation.  Long-term side effects are uncommon  with a properly fitted appliance. When issues occur, they include:  Changes in your bite. Teeth movement or loose teeth. Changes in your jaw muscles or TMJ. Visit a dentist regularly so they can check for changes in your dental health and make corrections as needed. They'll also ensure your device remains in good shape. Bring your device with you to all future dental appointments.  What are the benefits of oral appliance therapy for obstructive sleep apnea (OSA)? Oral appliance therapy:  Can improve symptoms of OSA, including daytime sleepiness and concentration issues. Some people experience deeper sleep with fewer disruptions. Can reduce or eliminate snoring. Offers another option for people who can't use a CPAP. Is easy to take along when traveling. Your response depends  on the shape of your mouth and how severely OSA affects you. Some people with mild to moderate OSA find an oral appliance completely resolves their symptoms. Other people don't notice an improvement and need different treatments.  Your dentist will explain all the potential benefits. They can explain the pros and cons of using an oral appliance for obstructive sleep apnea.  What questions should I ask my healthcare provider about an oral appliance to treat obstructive sleep apnea? How much does oral appliance therapy cost? Will my insurance cover the costs of oral appliance therapy? How many visits will getting an oral appliance require? How many sleep tests will I need to take? What kinds of side effects should I look for while using my device?

## 2022-10-21 NOTE — Progress Notes (Signed)
   CC:  memory loss  Follow-up Visit  Last visit: 03/17/22  Brief HPI: 72 year old female with a history of OSA, HTN, GERD, CKD 3, HLD, depression, glaucoma who follows in clinic for memory loss. MRI/MRA brain 06/16/21 were unremarkable other than for mild chronic microvascular ischemic changes. EEG 09/07/21 was normal. She underwent neuropsychological testing in November 2023, which was concerning for Lewy Body disease vs FTD. DaTSCAN 01/14/22 was equivocal (showed mild asymmetry with increased activity in left striatum vs right). PET scan 02/15/22 did not show any decreased cortical metabolism, and was not suggestive of Alzheimer's disease.   Interval History: Memory is stable to slightly improved since her last visit. Has had no more episodes of confusion. She is driving short distances again. Continues to suffer from anxiety and sees a therapist for this. Denies tremor, gait imbalance, or falls.   Her primary concern today is her sleep. She is using an AutoPAP which she finds very uncomfortable and would like to know if there are any alternative options.  Physical Exam:   Vital Signs: BP (!) 155/90   Pulse 97   Ht 5\' 1"  (1.549 m)   Wt 150 lb (68 kg)   BMI 28.34 kg/m  GENERAL:  well appearing, in no acute distress, alert  SKIN:  Color, texture, turgor normal. No rashes or lesions HEAD:  Normocephalic/atraumatic. RESP: normal respiratory effort MSK:  No gross joint deformities.   NEUROLOGICAL: Mental Status:     10/21/2022    3:20 PM 03/17/2022   11:34 AM 08/18/2021   11:33 AM  Montreal Cognitive Assessment   Visuospatial/ Executive (0/5) 4 4 2   Naming (0/3) 3 3 3   Attention: Read list of digits (0/2) 2 2 2   Attention: Read list of letters (0/1) 1 1 1   Attention: Serial 7 subtraction starting at 100 (0/3) 3 3 3   Language: Repeat phrase (0/2) 2 2 0  Language : Fluency (0/1) 0 1 0  Abstraction (0/2) 2 2 2   Delayed Recall (0/5) 5 2 3   Orientation (0/6) 6 6 6   Total 28 26 22    Adjusted Score (based on education)  27    Cranial Nerves: PERRL, face symmetric, no dysarthria, hearing grossly intact Motor: moves all extremities equally, no cogwheeling, no tremor, no bradykinesia Gait: normal-based.  IMPRESSION: 72 year old female with a history of OSA, HTN, GERD, CKD 3, HLD, depression, glaucoma who presents for follow up of memory loss and confusion. MOCA score today is stable and normal (28), and she has not had any further episodes of confusion. As of now she does not exhibit any evidence of a neurodegenerative disorder. She primarily wants to talk about her AutoPap today and alternative options. Advised her to discuss this at her next appointment with her Sleep doctor. Counseled that while there may be alternative devices such as dental appliances or Inspire device, she would need to discuss with her Sleep team to see if she is a candidate as her sleep apnea is severe.   PLAN: -No further memory testing at this time as patient is back to her baseline and has remained stable for the past several months -Follow up with Sleep team for OSA management   Follow-up: as needed  I spent a total of 41 minutes on the date of the service. Discussed medication side effects, adverse reactions and drug interactions. Written educational materials and patient instructions outlining all of the above were given.  Ocie Doyne, MD 10/21/22 4:03 PM

## 2022-10-27 ENCOUNTER — Ambulatory Visit: Payer: Medicare Other

## 2022-11-01 ENCOUNTER — Ambulatory Visit: Payer: Medicare Other | Admitting: Psychiatry

## 2022-11-02 ENCOUNTER — Encounter (INDEPENDENT_AMBULATORY_CARE_PROVIDER_SITE_OTHER): Payer: Self-pay | Admitting: Otolaryngology

## 2022-11-02 ENCOUNTER — Ambulatory Visit (INDEPENDENT_AMBULATORY_CARE_PROVIDER_SITE_OTHER): Payer: Medicare Other | Admitting: Otolaryngology

## 2022-11-02 ENCOUNTER — Telehealth: Payer: Self-pay | Admitting: Psychiatry

## 2022-11-02 VITALS — Ht 61.0 in | Wt 150.0 lb

## 2022-11-02 DIAGNOSIS — J342 Deviated nasal septum: Secondary | ICD-10-CM | POA: Diagnosis not present

## 2022-11-02 DIAGNOSIS — J310 Chronic rhinitis: Secondary | ICD-10-CM | POA: Diagnosis not present

## 2022-11-02 DIAGNOSIS — J343 Hypertrophy of nasal turbinates: Secondary | ICD-10-CM | POA: Diagnosis not present

## 2022-11-04 DIAGNOSIS — J310 Chronic rhinitis: Secondary | ICD-10-CM | POA: Insufficient documentation

## 2022-11-04 DIAGNOSIS — J342 Deviated nasal septum: Secondary | ICD-10-CM | POA: Insufficient documentation

## 2022-11-04 DIAGNOSIS — J343 Hypertrophy of nasal turbinates: Secondary | ICD-10-CM | POA: Insufficient documentation

## 2022-11-04 NOTE — Telephone Encounter (Signed)
Error

## 2022-11-04 NOTE — Progress Notes (Signed)
Patient ID: Ann Washington, female   DOB: 11-Feb-1950, 72 y.o.   MRN: [id number]  Cc: Chronic nasal obstruction  HPI:  Ann Washington is an 72 y.o. female who presents today complaining of chronic nasal obstruction and decreased sense of smell for the past year.  She has a history of environmental allergies.  She is currently on Allegra and Singulair daily.  She cannot tolerate the use of steroid nasal sprays due to her glaucoma.  She also has a history of obstructive sleep apnea.  She is currently using CPAP at night.  However, she is having difficulty using the CPAP machine due to her chronic nasal obstruction.  She has no previous ENT surgery.  Currently she denies any facial pain, fever, or visual change.  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   Required high dose PPI, now on prilosec     GAD (generalized anxiety disorder)    GERD (gastroesophageal reflux disease)    Glaucoma    Hyperlipidemia    Hypertension    Insomnia    MCI (mild cognitive impairment)    Snoring 03/08/2008   Tested 2007 per patient-unrevealing for sleep apnea      Past Surgical History:  Procedure Laterality Date   CATARACT EXTRACTION     late 2019   COLONOSCOPY     COLPOSCOPY     age 60   GYNECOLOGIC CRYOSURGERY     age 80   HAMMER TOE SURGERY     TENDON REPAIR Left    TUBAL LIGATION      Family History  Problem Relation Age of Onset   Alzheimer's disease Mother    Hip fracture Mother    Osteoporosis Mother    Deep vein thrombosis Father    Heart disease Father        MI 62s, nonsmoker   Diabetes Sister    Hypertension Sister    Breast cancer Sister        29s   Arthritis Sister    Hypertension Brother    Diabetes Maternal Grandmother    Stroke Maternal Grandfather    Depression Paternal Grandmother    Suicidality Paternal Grandfather    Depression Paternal  Grandfather    Colon cancer Neg Hx     Social History:  reports that she has never smoked. She has never used smokeless tobacco. She reports that she does not drink alcohol and does not use drugs.  Allergies:  Allergies  Allergen Reactions   Amlodipine Besy-Benazepril Hcl Other (See Comments)    Edema to ankle with redness/ but now she is tolerating the current regiment   Pravastatin     myalgias   Rosuvastatin     Severe fatigue when went to daily     Prior to Admission medications   Medication Sig Start Date End Date Taking? Authorizing Provider  amLODipine-valsartan (EXFORGE) 5-320 MG tablet Take 1 tablet by mouth daily. 04/26/22  Yes Shelva Majestic, MD  aspirin EC 81 MG tablet Take 1 tablet (81 mg total) by mouth daily. Swallow whole. 08/14/21  Yes Shelva Majestic, MD  Cyanocobalamin (B-12 PO) Take by mouth.   Yes [provider]  cyanocobalamin (VITAMIN B12) 1000 MCG tablet Take 1,000 mcg by mouth daily.   Yes [provider]  ezetimibe (ZETIA) 10 MG tablet Take 1 tablet (10 mg total) by  mouth daily. 04/26/22  Yes Shelva Majestic, MD  fluticasone [facility]) 50 MCG/ACT nasal spray SPRAY 2 SPRAYS INTO EACH NOSTRIL EVERY DAY 02/23/22  Yes Shelva Majestic, MD  latanoprost (XALATAN) 0.005 % ophthalmic solution 1 drop at bedtime.   Yes [provider]  magnesium (MAGTAB) 84 MG ( ) TBCR SR tablet Take 275 mg by mouth daily.   Yes [provider]  Multiple Vitamin (MULTIVITAMIN) tablet Take 1 tablet by mouth daily.   Yes [provider]  omeprazole (PRILOSEC) 20 MG capsule Take 20 mg by mouth daily.   Yes [provider]  progesterone (PROMETRIUM) 200 MG capsule TAKE 1 CAPSULE BY MOUTH EVERY DAY AT BEDTIME FOR 30 DAYS 09/24/22  Yes [provider]  Vitamin D, Ergocalciferol, (DRISDOL) 1.25 MG (50000 UNIT) CAPS capsule TAKE 1 CAPSULE (50,000 UNITS TOTAL) BY MOUTH EVERY 7 (SEVEN) DAYS 08/16/22  Yes Shelva Majestic, MD   timolol (TIMOPTIC) 0.5 % ophthalmic solution Place 1 drop into both eyes 2 (two) times daily. 03/03/17   [provider]    Height 5\' 1"  (1.549 m), weight 68 kg. Exam: General: Communicates without difficulty, well nourished, no acute distress. Head: Normocephalic, no evidence injury, no tenderness, facial buttresses intact without stepoff. Face/sinus: No tenderness to palpation and percussion. Facial movement is normal and symmetric. Eyes: PERRL, EOMI. No scleral icterus, conjunctivae clear. Neuro: CN II exam reveals vision grossly intact.  No nystagmus at any point of gaze. Ears: Auricles well formed without lesions.  Ear canals are intact without mass or lesion.  No erythema or edema is appreciated.  The TMs are intact without fluid. Nose: External evaluation reveals normal support and skin without lesions.  Dorsum is intact.  Anterior rhinoscopy reveals congested mucosa over anterior aspect of inferior turbinates and intact septum.  No purulence noted. Oral:  Oral cavity and oropharynx are intact, symmetric, without erythema or edema.  Mucosa is moist without lesions. Neck: Full range of motion without pain.  There is no significant lymphadenopathy.  No masses palpable.  Thyroid bed within normal limits to palpation.  Parotid glands and submandibular glands equal bilaterally without mass.  Trachea is midline. Neuro:  CN 2-12 grossly intact.    Procedure:  Flexible Nasal Endoscopy: Description: Risks, benefits, and alternatives of flexible endoscopy were explained to the patient.  Specific mention was made of the risk of throat numbness with difficulty swallowing, possible bleeding from the nose and mouth, and pain from the procedure.  The patient gave oral consent to proceed.  The flexible scope was inserted into the right nasal cavity.  Endoscopy of the interior nasal cavity, superior, inferior, and middle meatus was performed. The sphenoid-ethmoid recess was examined. Edematous mucosa was  noted.  No polyp, mass, or lesion was appreciated. Nasal septal deviation noted. Olfactory cleft was clear.  Nasopharynx was clear.  Turbinates were hypertrophied but without mass.  The procedure was repeated on the contralateral side with similar findings.  The patient tolerated the procedure well.    Assessment: Chronic rhinitis with nasal mucosal congestion, nasal septal deviation, and bilateral inferior turbinate hypertrophy.  No polyps, mass, or lesion is noted today. More than 95% of her nasal passageways are obstructed bilaterally.  The patient has not responded to allergy treatments over the years.  She cannot tolerate the use of steroid nasal spray due to her glaucoma.  Plan: 1.  The physical exam and nasal endoscopy findings are reviewed with the patient. 2.  Continue with Singulair and Allegra  daily. 3.  In light of her persistent symptoms, she may benefit from surgical intervention with septoplasty and turbinate reduction.  The risk, benefits, and details of the procedures are reviewed.  Questions were invited and answered. 4.  The patient would like to proceed with the procedures.  Starletta Houchin W Dickey Caamano 11/04/2022, 7:44 AM

## 2022-11-09 ENCOUNTER — Telehealth: Payer: Self-pay | Admitting: Psychiatry

## 2022-11-09 ENCOUNTER — Telehealth (INDEPENDENT_AMBULATORY_CARE_PROVIDER_SITE_OTHER): Payer: Self-pay | Admitting: Otolaryngology

## 2022-11-09 NOTE — Telephone Encounter (Signed)
Per the protocol, since the patient is established with Ann Washington for sleep, the patient should have been set up with Ann Washington moving forward with sleep care and memory care as the result of Ann Washington leaving. The patient was told by Ann Washington (not on purpose, Im sure) that she should establish her memory concerns with Ann Washington going forward. (Not realizing that Ann Washington was already seeing the patient for sleep)  Pt has requested that she just follow up with Ann Washington for both memory and sleep care moving forward. She states that her family member worked with Ann Washington and that is why she would prefer to have all her care with one provider and specifically requested Ann Washington.   Ann. Frances Washington has ok'd this transfer of care to Ann Washington and we are waiting on Ann Washington's decision. Once Ann Washington reviews then we will know how to move forward.

## 2022-11-09 NOTE — Telephone Encounter (Signed)
Huston Foley, MD  Daisey Must with me. Janene Harvey       Previous Messages    ----- Message ----- From: Ileene Hutchinson Sent: 11/02/2022   5:04 PM EDT To: Melvyn Novas, MD; Huston Foley, MD  Pt states when she was seeing Dr Delena Bali she was told Dr Delena Bali would see about having her put under the care of Dr Dohmeier for her memory.  Pt is now asking that Dr Vickey Huger be her provider for both memory and sleep to keep everything under one provider.  Pt was told the request would be submitted and that she would be contacted once the request has been considered. Forwarding this response to Baird Lyons, RN, Dr Frances Furbish ,and Dr Dohmeier so this can be acknowledged and responded to by all applicable staff.

## 2022-11-09 NOTE — Telephone Encounter (Signed)
Patient called and said she was seen on 11/02/22 and said she still wasn't able to see Dr Avel Sensor notes in [place] and she wasn't sure how long it took to be added in there and just asked if someone could let her know

## 2022-11-09 NOTE — Telephone Encounter (Signed)
Spoke w/ Pattricia Boss. States [place] sent the following message to Dr. Dohmeier/Dr. Frances Furbish on 11/02/22:  "Pt states when she was seeing Dr Delena Bali she was told Dr Delena Bali would see about having her put under the care of Dr Dohmeier for her memory.  Pt is now asking that Dr Vickey Huger be her provider for both memory and sleep to keep everything under one provider.  Pt was told the request would be submitted and that she would be contacted once the request has been considered."

## 2022-11-09 NOTE — Telephone Encounter (Addendum)
Pt called checking on status of being assigned to Dr. Vickey Huger for sleep and memory loss due to Dr. Delena Bali no longer at the practice. Would like both diagnosis with one physician. Phone staff, Misty Stanley emailed both physicians for adecision transferring care to Dr. Vickey Huger. Informed patient would put in note and Dr. Vickey Huger is not in the office today, but will send to her nurse.

## 2022-11-10 ENCOUNTER — Encounter: Payer: Self-pay | Admitting: Neurology

## 2022-11-10 DIAGNOSIS — G4733 Obstructive sleep apnea (adult) (pediatric): Secondary | ICD-10-CM

## 2022-11-10 NOTE — Telephone Encounter (Signed)
Pt called, the message was relayed to her that care would need to remain with current Neurologist, or she pursue care at another facility. This is FYI, no call back requested by pt.

## 2022-11-10 NOTE — Telephone Encounter (Signed)
Phone rep spoke with Dr Vickey Huger, who explained she will not be able to take pt on , as she is already established with Dr Frances Furbish. Pt can either continue care with Dr Frances Furbish or look into care at another facility.  Pt was called to have this message relayed, after checking DPR a brief vm was left asking pt to call office this is FYI to POD 1.

## 2022-11-11 ENCOUNTER — Ambulatory Visit
Admission: RE | Admit: 2022-11-11 | Discharge: 2022-11-11 | Disposition: A | Discharge status: Home / Self Care | Payer: Medicare Other | Source: Ambulatory Visit | Attending: Family Medicine | Admitting: Family Medicine

## 2022-11-11 DIAGNOSIS — Z1231 Encounter for screening mammogram for malignant neoplasm of breast: Secondary | ICD-10-CM

## 2022-11-11 NOTE — Telephone Encounter (Signed)
Pt is requesting a referral to dentistry for oral device Pt states mask is difficult to wear during the night Pt states she has p30i mask

## 2022-11-26 ENCOUNTER — Ambulatory Visit: Payer: Medicare Other | Admitting: Family Medicine

## 2022-11-30 ENCOUNTER — Encounter: Payer: Self-pay | Admitting: Family Medicine

## 2022-11-30 ENCOUNTER — Ambulatory Visit: Payer: Medicare Other | Admitting: Family Medicine

## 2022-11-30 VITALS — BP 132/78 | HR 68 | Temp 98.1°F | Ht 61.0 in | Wt 154.2 lb

## 2022-11-30 DIAGNOSIS — Z131 Encounter for screening for diabetes mellitus: Secondary | ICD-10-CM | POA: Diagnosis not present

## 2022-11-30 DIAGNOSIS — N183 Chronic kidney disease, stage 3 unspecified: Secondary | ICD-10-CM

## 2022-11-30 DIAGNOSIS — E782 Mixed hyperlipidemia: Secondary | ICD-10-CM

## 2022-11-30 DIAGNOSIS — I10 Essential (primary) hypertension: Secondary | ICD-10-CM

## 2022-11-30 DIAGNOSIS — R739 Hyperglycemia, unspecified: Secondary | ICD-10-CM | POA: Diagnosis not present

## 2022-11-30 DIAGNOSIS — F3342 Major depressive disorder, recurrent, in full remission: Secondary | ICD-10-CM

## 2022-11-30 DIAGNOSIS — E559 Vitamin D deficiency, unspecified: Secondary | ICD-10-CM

## 2022-11-30 DIAGNOSIS — E538 Deficiency of other specified B group vitamins: Secondary | ICD-10-CM

## 2022-11-30 DIAGNOSIS — G4700 Insomnia, unspecified: Secondary | ICD-10-CM

## 2022-11-30 LAB — COMPREHENSIVE METABOLIC PANEL
ALT: 15 U/L (ref 0–35)
AST: 18 U/L (ref 0–37)
Albumin: 4.4 g/dL (ref 3.5–5.2)
Alkaline Phosphatase: 63 U/L (ref 39–117)
BUN: 18 mg/dL (ref 6–23)
CO2: 26 meq/L (ref 19–32)
Calcium: 10.4 mg/dL (ref 8.4–10.5)
Chloride: 105 meq/L (ref 96–112)
Creatinine, Ser: 0.9 mg/dL (ref 0.40–1.20)
GFR: 63.24 mL/min (ref >60.00)
Glucose, Bld: 100 mg/dL — ABNORMAL HIGH (ref 70–99)
Potassium: 3.9 meq/L (ref 3.5–5.1)
Sodium: 138 meq/L (ref 135–145)
Total Bilirubin: 0.6 mg/dL (ref 0.2–1.2)
Total Protein: 7.9 g/dL (ref 6.0–8.3)

## 2022-11-30 LAB — CBC WITH DIFFERENTIAL/PLATELET
Basophils Absolute: 0 10*3/uL (ref 0.0–0.1)
Basophils Relative: 0.5 % (ref 0.0–3.0)
Eosinophils Absolute: 0.2 10*3/uL (ref 0.0–0.7)
Eosinophils Relative: 2 % (ref 0.0–5.0)
HCT: 38.8 % (ref 36.0–46.0)
Hemoglobin: 12.8 g/dL (ref 12.0–15.0)
Lymphocytes Relative: 26.8 % (ref 12.0–46.0)
Lymphs Abs: 2.3 10*3/uL (ref 0.7–4.0)
MCHC: 33.1 g/dL (ref 30.0–36.0)
MCV: 92.9 fL (ref 78.0–100.0)
Monocytes Absolute: 0.7 10*3/uL (ref 0.1–1.0)
Monocytes Relative: 8.2 % (ref 3.0–12.0)
Neutro Abs: 5.3 10*3/uL (ref 1.4–7.7)
Neutrophils Relative %: 62.5 % (ref 43.0–77.0)
Platelets: 277 10*3/uL (ref 150.0–400.0)
RBC: 4.18 Mil/uL (ref 3.87–5.11)
RDW: 12.6 % (ref 11.5–15.5)
WBC: 8.5 10*3/uL (ref 4.0–10.5)

## 2022-11-30 LAB — LIPID PANEL
Cholesterol: 204 mg/dL — ABNORMAL HIGH (ref 0–200)
HDL: 61.2 mg/dL (ref >39.00)
LDL Cholesterol: 123 mg/dL — ABNORMAL HIGH (ref 0–99)
NonHDL: 142.32
Total CHOL/HDL Ratio: 3
Triglycerides: 99 mg/dL (ref 0.0–149.0)
VLDL: 19.8 mg/dL (ref 0.0–40.0)

## 2022-11-30 LAB — HEMOGLOBIN A1C: Hgb A1c MFr Bld: 5.6 % (ref 4.6–6.5)

## 2022-11-30 LAB — VITAMIN B12: Vitamin B-12: >1537 pg/mL — ABNORMAL HIGH (ref 211–911)

## 2022-11-30 LAB — VITAMIN D 25 HYDROXY (VIT D DEFICIENCY, FRACTURES): VITD: 35.54 ng/mL (ref 30.00–100.00)

## 2022-11-30 NOTE — Progress Notes (Signed)
Phone [phone] In person visit   Subjective:   Ann Washington is a 72 y.o. year old very pleasant female patient who presents for/with See problem oriented charting Chief Complaint  Patient presents with   Medical Management of Chronic Issues   Hyperlipidemia   Depression   Hypertension   Insomnia    Pt c/o still having sleep issues, has gotten CPAP   Past Medical History-  Patient Active Problem List   Diagnosis Date Noted   CKD (chronic kidney disease), stage III (HCC) 03/14/2014    Priority: Medium    Glaucoma 03/08/2014    Priority: Medium    Insomnia 03/08/2014    Priority: Medium    Hyperglycemia 12/09/2009    Priority: Medium    Hyperlipidemia 10/13/2006    Priority: Medium    Major depression, recurrent, full remission (HCC) 10/13/2006    Priority: Medium    Hypertension, essential 10/10/2006    Priority: Medium    Normocytic anemia 09/12/2014    Priority: Low   Stress incontinence, female 10/12/2011    Priority: Low   Vaginal atrophy 05/27/2011    Priority: Low   Obesity 10/11/2008    Priority: Low   GERD- failed h2 blocker 08/25/2007    Priority: Low   Chronic rhinitis 11/04/2022   Deviated nasal septum 11/04/2022   Hypertrophy of nasal turbinates 11/04/2022   Osteoarthritis of ankle and foot 12/08/2018   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 90 tablet 3   aspirin EC 81 MG tablet Take 1 tablet (81 mg total) by mouth daily. Swallow whole. 30 tablet 12   cyanocobalamin (VITAMIN B12) 1000 MCG tablet Take 1,000 mcg by mouth daily.     ezetimibe (ZETIA) 10 MG tablet Take 1 tablet (10 mg total) by mouth daily. 90 tablet 3   fluticasone (FLONASE) 50 MCG/ACT nasal spray SPRAY 2 SPRAYS INTO EACH NOSTRIL EVERY DAY 48 mL 3   latanoprost (XALATAN) 0.005 % ophthalmic solution 1 drop at  bedtime.     magnesium (MAGTAB) 84 MG ( ) TBCR SR tablet Take 275 mg by mouth daily.     Multiple Vitamin (MULTIVITAMIN) tablet Take 1 tablet by mouth daily.     omeprazole (PRILOSEC) 20 MG capsule Take 20 mg by mouth daily.     progesterone (PROMETRIUM) 200 MG capsule TAKE 1 CAPSULE BY MOUTH EVERY DAY AT BEDTIME FOR 30 DAYS     Vitamin D, Ergocalciferol, (DRISDOL) 1.25 MG (50000 UNIT) CAPS capsule TAKE 1 CAPSULE (50,000 UNITS TOTAL) BY MOUTH EVERY 7 (SEVEN) DAYS 13 capsule 1   No current facility-administered medications for this visit.     Objective:  BP 138/74   Pulse 68   Temp 98.1 F (36.7 C)   Ht 5\' 1"  (1.549 m)   Wt 154 lb 3.2 oz (69.9 kg)   BMI 29.14 kg/m  Gen: NAD, resting comfortably CV: RRR no murmurs rubs or gallops Lungs: CTAB no crackles, wheeze, rhonchi Ext: minimal edema Skin: warm, dry     Assessment and Plan   # Memory loss-released from neurology 10/21/22-has had DAT scan and PET metabolic which do not clearly point toward cause. Apparently Dr. Delena Bali retired -improvemetn at neuro visit 03/17/22- felt possibly related to reducing psychiatric medications. Also reeasurring visit 10/21/22  #OSA- compliant with CPAP starting 2024  - feels allergies worse on this - encouraged  her to call her sleep doctor. Has noted some tinnitus and fluid in right ear. Saw ENT- may get 2nd opinoin but encouraged her to be consistent with her allegra first and she reports helping  #Hypertension/CKD stage III S: Compliant with amlodipine-valsartan 5-320 mg.  Knows to avoid NSAIDs-Celebrex prescribed by Dr. Al Corpus in past is no longer on  Valsartan is protective in case there is a proteinuric element. A/P: hypertension stable- continue current medicines  Chronic kidney disease III- hopefully stable- update cmp today. Continue current meds for now    #Hyperlipidemia #concern for TIA 05/26/21- with aphasia- MRI, MRA, holter, echo largely reassuring other than LVH (cardiology saw) S:  medication: Trouble with statins, aspirin 81 mg daily , trial zetia 10 mg 2024 -07/19/21 reported more fatigue and less pain from typical pains when starting this dose up from rosuvastatin 15 mg a week- had opted to try off 2 weeks and noted some improvement-continue to monitor off for now -  Myalgias on pravastatin in the past. Lab Results  Component Value Date   CHOL 207 (H) 04/26/2022   HDL 63.70 04/26/2022   LDLCALC 125 (H) 04/26/2022   LDLDIRECT 131.0 02/18/2022   TRIG 92.0 04/26/2022   CHOLHDL 3 04/26/2022  A/P: lipids above goal but statin intolerant- hoping mild improvement on zetia    #Hyperglycemia S: Increased risk of diabetes.  No current medication.  Lifestyle modification only. Lab Results  Component Value Date   HGBA1C 6.0 02/18/2022   HGBA1C 6.0 04/23/2021   HGBA1C 6.2 10/09/2020  A/P: hopefully stable- update a1c today. Continue without meds for now     #Depression in full remission # Insomnia S: medication: none   - not working- ramelteon 8 mg.  -has been taken off of lexapro and trazodone 50 mg prescribed by psychiatry  -Ambien in the past  -Did have behavioral health urgent care visit on 03/03/2021 -[facility] Allayne Stack, NP.      02/18/2022   10:52 AM 11/06/2021    9:39 AM 09/16/2021   10:39 AM 08/12/2021   10:26 AM 04/23/2021    9:27 AM  Depression screen PHQ 2/9  Decreased Interest 0 0 0 1 0  Down, Depressed, Hopeless 0 0 0 1 0  PHQ - 2 Score 0 0 0 2 0  Altered sleeping 0  1 0 0  Tired, decreased energy 0  2 1 3   Change in appetite 0  0 0 0  Feeling bad or failure about yourself  0  0 0 0  Trouble concentrating 0  0 1 0  Moving slowly or fidgety/restless 0  1 0 0  Suicidal thoughts 0  0 0 0  PHQ-9 Score 0  4 4 3   Difficult doing work/chores Not difficult at all    Not difficult at all  A/P: depression in full remission- continue without medications. Still prefers to be close to her husband Insomnia- poor control and causing some  daytime fatigue- refer to behavioral health for CBT I .  #Glaucoma- Dr. Suszanne Conners told her avoid Flonase  #Vitamin D deficiency S: Medication: took high dose vitamin D after April visit. On multivitamin with vitamin D  Last vitamin D Lab Results  Component Value Date   VD25OH 22.12 (L) 04/26/2022  A/P: hopefully improved- update vitamin D today. Continue current meds for now    # B12 deficiency S: Current treatment/medication (oral vs. IM): B12 daily Lab Results  Component Value Date   VITAMINB12 369 04/26/2022  A/P:  hopefully stable- update b12 today. Continue current meds for now    Recommended follow up: Return for next already scheduled visit or sooner if needed. Future Appointments  Date Time Provider Department Center  12/31/2022 11:20 AM Shelva Majestic, MD [facility]  01/17/2023 11:00 AM Lomax, Amy, NP [facility] None   Lab/Order associations:   ICD-10-CM   1. Hypertension, essential  I10 Comprehensive metabolic panel    CBC with Differential/Platelet    2. Mixed hyperlipidemia  E78.2 Direct LDL    3. Major depression, recurrent, full remission (HCC)  F33.42     4. Stage 3 chronic kidney disease, unspecified whether stage 3a or 3b CKD (HCC)  N18.30     5. Insomnia, unspecified type  G47.00 Ambulatory referral to Psychology    6. B12 deficiency  E53.8 Vitamin B12    7. Vitamin D deficiency  E55.9 VITAMIN D 25 Hydroxy (Vit-D Deficiency, Fractures)    8. Hyperglycemia  R73.9 HgB A1c    9. Screening for diabetes mellitus  Z13.1 HgB A1c      No orders of the defined types were placed in this encounter.   Return precautions advised.  Tana Conch, MD

## 2022-11-30 NOTE — Patient Instructions (Addendum)
[facility] contact- overdue Please call to schedule visit and/or procedure Address: [address] [place], [place], [place] [address] Phone: [phone]   Try to be consistent with allegra with congestion  We have placed a referral for you today to [facility] Terri Bauert. In some cases you will see # listed below- you can call this if you have not heard within a week. If you do not see # listed- you should receive a mychart message or phone call within a week with the # to call directly- call that as soon as you get it. If you are having issues getting scheduled reach out to [place] again.   Please stop by lab before you go If you have mychart- we will send your results within 3 business days of [place] receiving them.  If you do not have mychart- we will call you about results within 5 business days of [place] receiving them.  *please also note that you will see labs on mychart as soon as they post. I will later go in and write notes on them- will say "notes from Dr. Durene Cal"   You are eligible to schedule your annual wellness visit with our nurse specialist Inetta Fermo.  Please consider scheduling this before you leave today  Recommended follow up: Return for next already scheduled visit or sooner if needed.

## 2022-11-30 NOTE — Addendum Note (Signed)
Addended by: Lorn Junes on: 11/30/2022 12:26 PM   Modules accepted: Orders

## 2022-11-30 NOTE — Addendum Note (Signed)
Addended by: Lorn Junes on: 11/30/2022 12:29 PM   Modules accepted: Orders

## 2022-12-31 ENCOUNTER — Ambulatory Visit: Payer: Medicare Other | Admitting: Family Medicine

## 2023-01-17 ENCOUNTER — Ambulatory Visit: Payer: Medicare Other | Admitting: Family Medicine

## 2023-03-02 ENCOUNTER — Telehealth: Payer: Self-pay

## 2023-03-02 ENCOUNTER — Ambulatory Visit: Payer: Medicare Other

## 2023-03-02 VITALS — Wt 154.0 lb

## 2023-03-02 DIAGNOSIS — Z1211 Encounter for screening for malignant neoplasm of colon: Secondary | ICD-10-CM

## 2023-03-02 DIAGNOSIS — Z0000 Encounter for general adult medical examination without abnormal findings: Secondary | ICD-10-CM

## 2023-03-02 NOTE — Progress Notes (Signed)
 Subjective:   Ann Washington is a 73 y.o. female who presents for Medicare Annual (Subsequent) preventive examination.  Visit Complete: Virtual I connected with  Ann Washington on 03/02/23 by a audio enabled telemedicine application and verified that I am speaking with the correct person using two identifiers.  Patient Location: Home  Provider Location: Home Office  I discussed the limitations of evaluation and management by telemedicine. The patient expressed understanding and agreed to proceed.  Vital Signs: Because this visit was a virtual/telehealth visit, some criteria may be missing or patient reported. Any vitals not documented were not able to be obtained and vitals that have been documented are patient reported.  Patient Medicare AWV questionnaire was completed by the patient on 02/28/23; I have confirmed that all information answered by patient is correct and no changes since this date.  Cardiac Risk Factors include: advanced age (>63men, >72 women);hypertension;dyslipidemia     Objective:    Today's Vitals   03/02/23 1125  Weight: 154 lb (69.9 kg)   Body mass index is 29.1 kg/m.     03/02/2023   11:43 AM 11/06/2021    9:28 AM 02/22/2021    1:18 PM 10/24/2020    8:55 AM 10/22/2019    3:17 PM 10/05/2018    9:47 AM 09/14/2017    2:10 PM  Advanced Directives  Does Patient Have a Medical Advance Directive? No Yes No Yes Yes No No  Type of Special Educational Needs Teacher of [place];Living will   Healthcare Power of [place];Living will    Does patient want to make changes to medical advance directive?  No - Patient declined  Yes (MAU/Ambulatory/Procedural Areas - Information given)     Copy of Healthcare Power of Attorney in Chart?  No - copy requested   No - copy requested    Would patient like information on creating a medical advance directive? No - Patient declined  No - Patient declined   Yes (MAU/Ambulatory/Procedural Areas - Information given) No - Patient  declined    Current Medications (verified) Outpatient Encounter Medications as of 03/02/2023  Medication Sig   amLODipine -valsartan  (EXFORGE ) 5-320 MG tablet Take 1 tablet by mouth daily.   aspirin  EC 81 MG tablet Take 1 tablet (81 mg total) by mouth daily. Swallow whole.   estradiol  (VIVELLE -DOT) 0.05 MG/24HR patch Place 1 patch onto the skin 2 (two) times a week.   ezetimibe  (ZETIA ) 10 MG tablet Take 1 tablet (10 mg total) by mouth daily.   fluticasone  (FLONASE ) 50 MCG/ACT nasal spray SPRAY 2 SPRAYS INTO EACH NOSTRIL EVERY DAY   latanoprost (XALATAN) 0.005 % ophthalmic solution 1 drop at bedtime.   magnesium (MAGTAB) 84 MG ( ) TBCR SR tablet Take 275 mg by mouth daily.   Multiple Vitamin (MULTIVITAMIN) tablet Take 1 tablet by mouth daily.   omeprazole (PRILOSEC) 20 MG capsule Take 20 mg by mouth daily.   progesterone (PROMETRIUM) 200 MG capsule TAKE 1 CAPSULE BY MOUTH EVERY DAY AT BEDTIME FOR 30 DAYS   Vitamin D , Ergocalciferol , (DRISDOL ) 1.25 MG (50000 UNIT) CAPS capsule TAKE 1 CAPSULE (50,000 UNITS TOTAL) BY MOUTH EVERY 7 (SEVEN) DAYS (Patient not taking: Reported on 03/02/2023)   [DISCONTINUED] cyanocobalamin  (VITAMIN B12) 1000 MCG tablet Take 1,000 mcg by mouth daily.   No facility-administered encounter medications on file as of 03/02/2023.    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   Required high dose PPI, now on prilosec     GAD (generalized anxiety disorder)    GERD (gastroesophageal reflux disease)    Glaucoma    Hyperlipidemia    Hypertension    Insomnia    MCI (mild cognitive impairment)    Snoring 03/08/2008   Tested 2007 per patient-unrevealing for sleep apnea     Past Surgical History:  Procedure Laterality Date   CATARACT EXTRACTION     late 2019    COLONOSCOPY     COLPOSCOPY     age 65   GYNECOLOGIC CRYOSURGERY     age 54   HAMMER TOE SURGERY     TENDON REPAIR Left    TUBAL LIGATION     Family History  Problem Relation Age of Onset   Alzheimer's disease Mother    Hip fracture Mother    Osteoporosis Mother    Deep vein thrombosis Father    Heart disease Father        MI 23s, nonsmoker   Diabetes Sister    Hypertension Sister    Breast cancer Sister        14s   Arthritis Sister    Hypertension Brother    Diabetes Maternal Grandmother    Stroke Maternal Grandfather    Depression Paternal Grandmother    Suicidality Paternal Grandfather    Depression Paternal Grandfather    Colon cancer Neg Hx    Social History   Socioeconomic History   Marital status: Married    Spouse name: Jerel   Number of children: 3   Years of education: Not on file   Highest education level: Some college, no degree  Occupational History   Not on file  Tobacco Use   Smoking status: Never   Smokeless tobacco: Never  Vaping Use   Vaping status: Never Used  Substance and Sexual Activity   Alcohol use: No   Drug use: No   Sexual activity: Yes    Birth control/protection: Surgical  Other Topics Concern   Not on file  Social History Narrative   Married (husband patient at [facility]). 3 children. 7 grandkids.       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       Hobbies: time with grandkids, walk when weather nice, reading, tv      Caffiene: 2 cups am, decaf occasional at night.    Social Drivers of Corporate Investment Banker Strain: Low Risk  (02/28/2023)   Overall Financial Resource Strain (CARDIA)    Difficulty of Paying Living Expenses: Not hard at all  Food Insecurity: No Food Insecurity (02/28/2023)   Hunger Vital Sign    Worried About Running Out of Food in the Last Year: Never true    Ran Out of Food in the Last Year: Never true  Transportation Needs: No Transportation Needs (02/28/2023)    PRAPARE - Administrator, Civil Service (Medical): No    Lack of Transportation (Non-Medical): No  Physical Activity: Unknown (02/28/2023)   Exercise Vital Sign    Days of Exercise per Week: Patient declined    Minutes of Exercise per Session: Not on file  Stress: No Stress Concern Present (02/28/2023)   [employer] of Occupational Health - Occupational Stress Questionnaire    Feeling of Stress : Only a little  Social Connections: Socially Integrated (02/28/2023)   Social Connection  and Isolation Panel [NHANES]    Frequency of Communication with Friends and Family: More than three times a week    Frequency of Social Gatherings with Friends and Family: Once a week    Attends Religious Services: More than 4 times per year    Active Member of [employer] or Organizations: Yes    Attends Engineer, Structural: More than 4 times per year    Marital Status: Married    Tobacco Counseling Counseling given: Not Answered   Clinical Intake:  Pre-visit preparation completed: Yes  Pain : No/denies pain     BMI - recorded: 29.1 Nutritional Status: BMI 25 -29 Overweight Nutritional Risks: None Diabetes: No  How often do you need to have someone help you when you read instructions, pamphlets, or other written materials from your doctor or pharmacy?: 1 - Never  Interpreter Needed?: No  Information entered by :: Ellouise Haws, LPN   Activities of Daily Living    03/02/2023   11:30 AM  In your present state of health, do you have any difficulty performing the following activities:  Hearing? 0  Vision? 0  Difficulty concentrating or making decisions? 0  Walking or climbing stairs? 0  Dressing or bathing? 0  Doing errands, shopping? 0  Preparing Food and eating ? N  Using the Toilet? N  In the past six months, have you accidently leaked urine? N  Do you have problems with loss of bowel control? N  Managing your Medications? N  Managing your Finances? N   Housekeeping or managing your Housekeeping? N    Patient Care Team: Katrinka Garnette KIDD, MD as PCP - General (Family Medicine) Dann Candyce RAMAN, MD as PCP - Cardiology (Cardiology) Estelle Service, MD as Consulting Physician (Obstetrics and Gynecology) Cary Doffing, MD as Consulting Physician (Dermatology) Carilyn Prentice BRAVO, MD as Consulting Physician (Physical Medicine and Rehabilitation) Patrcia Sharper, MD as Consulting Physician (Ophthalmology) Verta Royden DASEN, DPM as Consulting Physician (Podiatry) Nicholaus, Sherlean CROME, [facility] (Inactive) (Pharmacist)  Indicate any recent Medical Services you may have received from other than [facility] providers in the past year (date may be approximate).     Assessment:   This is a routine wellness examination for Tayana.  Hearing/Vision screen Hearing Screening - Comments:: Pt denies ANY hearing issues  Vision Screening - Comments:: Pt follows up with dr patrcia for annual eye exams    Goals Addressed   None    Depression Screen    03/02/2023   11:42 AM 11/30/2022   12:55 PM 02/18/2022   10:52 AM 11/06/2021    9:39 AM 09/16/2021   10:39 AM 08/12/2021   10:26 AM 04/23/2021    9:27 AM  PHQ 2/9 Scores  PHQ - 2 Score 0 1 0 0 0 2 0  PHQ- 9 Score  7 0  4 4 3     Fall Risk    03/02/2023   11:44 AM 11/30/2022   12:55 PM 11/06/2021    9:32 AM 04/23/2021    9:27 AM 10/24/2020    8:55 AM  Fall Risk   Falls in the past year? 0 0 1 0 0  Number falls in past yr: 0 0 0 0 0  Injury with Fall? 0 0 0 0 0  Risk for fall due to : No Fall Risks No Fall Risks History of fall(s);Impaired balance/gait;Impaired mobility No Fall Risks Impaired vision  Follow up Falls prevention discussed;Falls evaluation completed Falls evaluation completed Falls evaluation completed Falls evaluation completed Falls  prevention discussed    MEDICARE RISK AT HOME: Medicare Risk at Home Any stairs in or around the home?: Yes If so, are there any without handrails?: Yes Home free  of loose throw rugs in walkways, pet beds, electrical cords, etc?: Yes Adequate lighting in your home to reduce risk of falls?: Yes Life alert?: No Use of a cane, walker or w/c?: No Grab bars in the bathroom?: No Shower chair or bench in shower?: No Elevated toilet seat or a handicapped toilet?: No  TIMED UP AND GO:  Was the test performed?  No    Cognitive Function:    04/01/2016    9:08 AM  MMSE - Mini Mental State Exam  Not completed: --      10/21/2022    3:20 PM 03/17/2022   11:34 AM 08/18/2021   11:33 AM  Montreal Cognitive Assessment   Visuospatial/ Executive (0/5) 4 4 2   Naming (0/3) 3 3 3   Attention: Read list of digits (0/2) 2 2 2   Attention: Read list of letters (0/1) 1 1 1   Attention: Serial 7 subtraction starting at 100 (0/3) 3 3 3   Language: Repeat phrase (0/2) 2 2 0  Language : Fluency (0/1) 0 1 0  Abstraction (0/2) 2 2 2   Delayed Recall (0/5) 5 2 3   Orientation (0/6) 6 6 6   Total 28 26 22   Adjusted Score (based on education)  27       03/02/2023   11:47 AM 11/06/2021    9:30 AM 10/24/2020    8:58 AM 10/22/2019    3:22 PM 10/05/2018    9:59 AM  6CIT Screen  What Year? 0 points 0 points 0 points 0 points 0 points  What month? 0 points 0 points 0 points 0 points 0 points  What time? 0 points 0 points 0 points  0 points  Count back from 20 0 points 0 points 0 points 0 points 0 points  Months in reverse 0 points 0 points 0 points 0 points 0 points  Repeat phrase 0 points 0 points 0 points 0 points 0 points  Total Score 0 points 0 points 0 points  0 points    Immunizations Immunization History  Administered Date(s) Administered   Fluad Quad(high Dose 65+) 11/10/2020   Influenza,inj,Quad PF,6+ Mos 11/15/2012, 10/30/2015, 10/12/2019   Influenza-Unspecified 09/25/2013, 11/10/2014, 10/29/2016   PFIZER(Purple Top)SARS-COV-2 Vaccination 02/15/2019, 03/08/2019, 11/09/2019   Pfizer Covid-19 Vaccine Bivalent Booster 63yrs & up 11/10/2020   Pneumococcal  Conjugate-13 03/08/2014   Pneumococcal Polysaccharide-23 03/13/2015   Td 01/26/2003   Tdap 10/16/2015   Zoster, Live 12/12/2009    TDAP status: Up to date  Flu Vaccine status: Due, Education has been provided regarding the importance of this vaccine. Advised may receive this vaccine at local pharmacy or Health Dept. Aware to provide a copy of the vaccination record if obtained from local pharmacy or Health Dept. Verbalized acceptance and understanding.  Pneumococcal vaccine status: Up to date  Covid-19 vaccine status: Information provided on how to obtain vaccines.   Qualifies for Shingles Vaccine? Yes   Zostavax completed No   Shingrix Completed?: No.    Education has been provided regarding the importance of this vaccine. Patient has been advised to call insurance company to determine out of pocket expense if they have not yet received this vaccine. Advised may also receive vaccine at local pharmacy or Health Dept. Verbalized acceptance and understanding.  Screening Tests Health Maintenance  Topic Date Due   Colonoscopy  08/10/2022   COVID-19 Vaccine (5 - 2024-25 season) 09/26/2022   Zoster Vaccines- Shingrix (1 of 2) 03/02/2023 (Originally 01/01/1970)   INFLUENZA VACCINE  04/25/2023 (Originally 08/26/2022)   Medicare Annual Wellness (AWV)  03/01/2024   MAMMOGRAM  11/10/2024   DTaP/Tdap/Td (3 - Td or Tdap) 10/15/2025   Pneumonia Vaccine 62+ Years old  Completed   DEXA SCAN  Completed   Hepatitis C Screening  Completed   HPV VACCINES  Aged Out    Health Maintenance  Health Maintenance Due  Topic Date Due   Colonoscopy  08/10/2022   COVID-19 Vaccine (5 - 2024-25 season) 09/26/2022    Colorectal cancer screening: Referral to GI placed 03/02/23. Pt aware the office will call re: appt.  Mammogram status: Completed 11/11/22. Repeat every year  Bone Density status: Completed 05/31/19. Results reflect: Bone density results: NORMAL. Repeat every 2 years.  Additional  Screening:  Hepatitis C Screening:  Completed 03/13/15  Vision Screening: Recommended annual ophthalmology exams for early detection of glaucoma and other disorders of the eye. Is the patient up to date with their annual eye exam?  Yes  Who is the provider or what is the name of the office in which the patient attends annual eye exams? Dr Patrcia If pt is not established with a provider, would they like to be referred to a provider to establish care? No .   Dental Screening: Recommended annual dental exams for proper oral hygiene   Community Resource Referral / Chronic Care Management: CRR required this visit?  No   CCM required this visit?  No     Plan:     I have personally reviewed and noted the following in the patient's chart:   Medical and social history Use of alcohol, tobacco or illicit drugs  Current medications and supplements including opioid prescriptions. Patient is not currently taking opioid prescriptions. Functional ability and status Nutritional status Physical activity Advanced directives List of other physicians Hospitalizations, surgeries, and ER visits in previous 12 months Vitals Screenings to include cognitive, depression, and falls Referrals and appointments  In addition, I have reviewed and discussed with patient certain preventive protocols, quality metrics, and best practice recommendations. A written personalized care plan for preventive services as well as general preventive health recommendations were provided to patient.     Ellouise VEAR Haws, LPN   07/28/7972   After Visit Summary: (MyChart) Due to this being a telephonic visit, the after visit summary with patients personalized plan was offered to patient via MyChart   Nurse Notes: none

## 2023-03-02 NOTE — Patient Instructions (Addendum)
 Ms. Nuttle , Thank you for taking time to come for your Medicare Wellness Visit. I appreciate your ongoing commitment to your health goals. Please review the following plan we discussed and let me know if I can assist you in the future.   Referrals/Orders/Follow-Ups/Clinician Recommendations: Aim for 30 minutes of exercise or brisk walking, 6-8 glasses of water, and 5 servings of fruits and vegetables each day.You have an order for:  []   2D Mammogram  []   3D Mammogram  []   Bone Density     Please call for appointment:  [facility] [address] [place], [place] [address] [phone]  [facility] [address] [place], [place] [address] [phone]  [facility] [address] [place], [place] [address] [phone]  [facility] D[facility] [address] [place], [place] [address] [phone]  [facility] [address] [place], [place] [address] [phone]    Make sure to wear two-piece clothing.  No lotions, powders, or deodorants the day of the appointment. Make sure to bring picture ID and insurance card.  Bring list of medications you are currently taking including any supplements.   Schedule your [place] screening mammogram through MyChart!   Log into your MyChart account.  Go to 'Visit' (or 'Appointments' if on mobile App) --> Schedule an Appointment  Under 'Select a Reason for Visit' choose the Mammogram Screening option.  Complete the pre-visit questions and select the time and place that best fits your schedule.     This is a list of the screening recommended for you and due dates:  Health Maintenance  Topic Date Due   Colon Cancer Screening  08/10/2022   COVID-19 Vaccine (5 - 2024-25 season) 09/26/2022   Zoster (Shingles) Vaccine (1 of 2) 03/02/2023*   Flu Shot  04/25/2023*   Medicare Annual Wellness Visit  03/01/2024    Mammogram  11/10/2024   DTaP/Tdap/Td vaccine (3 - Td or Tdap) 10/15/2025   Pneumonia Vaccine  Completed   DEXA scan (bone density measurement)  Completed   Hepatitis C Screening  Completed   HPV Vaccine  Aged Out  *Topic was postponed. The date shown is not the original due date.   Vaccinations: declines all Influenza vaccine: recommend every Fall Pneumococcal vaccine: recommend once per lifetime Prevnar-20 Tdap vaccine: recommend every 10 years Shingles vaccine: recommend Shingrix which is 2 doses 2-6 months apart and over 90% effective     Covid-19: recommend 2 doses one month apart with a booster 6 months later   Advanced directives: (Declined) Advance directive discussed with you today. Even though you declined this today, please call our office should you change your mind, and we can give you the proper paperwork for you to fill out.  Next Medicare Annual Wellness Visit scheduled for next year: Yes

## 2023-03-02 NOTE — Telephone Encounter (Signed)
 Spoke with pt about Cpap mask

## 2023-03-02 NOTE — Telephone Encounter (Signed)
 See below.  Copied from CRM [id number]. Topic: General - Call Back - No Documentation >> Mar 02, 2023 12:05 PM Leotis ORN wrote: Reason for CRM: patient is wanting a call back from tina, whom she had a video visit with earlier today she is wanting to discuss the cpap machine they went over at her appt patient callback [phone]

## 2023-03-29 ENCOUNTER — Telehealth: Payer: Self-pay | Admitting: Psychiatry

## 2023-03-29 NOTE — Telephone Encounter (Signed)
 Pt has appt 04-04-2023 with Shawnie Dapper, NP.  She is asking about her pressure?? But when questioned more about her mask/head gear, wanted to change back to what she had previously.  I told her that her order last done has supplies available for a year from the time was written for.  She can contact [phone] to explain what is going on and if needs order  Amy NP can address when she is in for appt.  Pt verbalized understanding.  I sent community message to [facility] to see if [place].

## 2023-03-29 NOTE — Telephone Encounter (Signed)
 Sees Dr. Frances Furbish for sleep

## 2023-03-29 NOTE — Telephone Encounter (Signed)
 RE: pt would like to go [place] for [place] cpap Received: Today New, Kathyrn Sheriff, [place]; Guy Begin, RN Hello Dois Davenport,  Yes, the patient can choose any of our locations to be svs at. I do not see an order for supplies however.  Please advise.  It also looks like her prefered location has already been changed to [facility] location. She is free to change that at any time.  Thank you,  Brad New     Previous Messages    ----- Message ----- From: Kathyrn Sheriff Sent: 03/29/2023  11:24 AM EST To: Dorris Fetch, RN Subject: RE: [place] would like [place]*  Received, thank you!  ----- Message ----- From: Guy Begin, RN Sent: 03/29/2023  10:57 AM EST To: Elige Radon New Subject: pt would like to go to [place] f*  Good morning,  [place] [facility] [place] [facility]ap [facility]  Is that possible?    Sandy RN  Ann Washington Female, 73 y.o., 05/07/1950 MRN: [id number] Phone: [phone]-

## 2023-03-29 NOTE — Telephone Encounter (Signed)
 Pt is requesting to increase pressure  from a 4 in CPAP

## 2023-03-29 NOTE — Telephone Encounter (Signed)
 Ann Washington

## 2023-03-30 NOTE — Patient Instructions (Signed)
 Below is our plan:  We will continue to monitor memory. Check with Dr Christell Constant in [facility] for ENT second option if you wish.   Please continue using your CPAP regularly. While your insurance requires that you use CPAP at least 4 hours each night on 70% of the nights, I recommend, that you not skip any nights and use it throughout the night if you can. Getting used to CPAP and staying with the treatment long term does take time and patience and discipline. Untreated obstructive sleep apnea when it is moderate to severe can have an adverse impact on cardiovascular health and raise her risk for heart disease, arrhythmias, hypertension, congestive heart failure, stroke and diabetes. Untreated obstructive sleep apnea causes sleep disruption, nonrestorative sleep, and sleep deprivation. This can have an impact on your day to day functioning and cause daytime sleepiness and impairment of cognitive function, memory loss, mood disturbance, and problems focussing. Using CPAP regularly can improve these symptoms.  We will update supply orders, today.   Please make sure you are staying well hydrated. I recommend 50-60 ounces daily. Well balanced diet and regular exercise encouraged. Consistent sleep schedule with 6-8 hours recommended.   Please continue follow up with care team as directed.   Follow up with me in 1 year  You may receive a survey regarding today's visit. I encourage you to leave honest feed back as I do use this information to improve patient care. Thank you for seeing me today!   Management of Memory Problems   There are some general things you can do to help manage your memory problems.  Your memory may not in fact recover, but by using techniques and strategies you will be able to manage your memory difficulties better.   1)  Establish a routine. Try to establish and then stick to a regular routine.  By doing this, you will get used to what to expect and you will reduce the need to rely on your  memory.  Also, try to do things at the same time of day, such as taking your medication or checking your calendar first thing in the morning. Think about think that you can do as a part of a regular routine and make a list.  Then enter them into a daily planner to remind you.  This will help you establish a routine.   2)  Organize your environment. Organize your environment so that it is uncluttered.  Decrease visual stimulation.  Place everyday items such as keys or cell phone in the same place every day (ie.  Basket next to front door) Use post it notes with a brief message to yourself (ie. Turn off light, lock the door) Use labels to indicate where things go (ie. Which cupboards are for food, dishes, etc.) Keep a notepad and pen by the telephone to take messages   3)  Memory Aids A diary or journal/notebook/daily planner Making a list (shopping list, chore list, to do list that needs to be done) Using an alarm as a reminder (kitchen timer or cell phone alarm) Using cell phone to store information (Notes, Calendar, Reminders) Calendar/White board placed in a prominent position Post-it notes   In order for memory aids to be useful, you need to have good habits.  It's no good remembering to make a note in your journal if you don't remember to look in it.  Try setting aside a certain time of day to look in journal.   4)  Improving mood  and managing fatigue. There may be other factors that contribute to memory difficulties.  Factors, such as anxiety, depression and tiredness can affect memory. Regular gentle exercise can help improve your mood and give you more energy. Exercise: there are short videos created by the [employer] on Health specially for older adults: https://bit.ly/2I30q97.  Mediterranean diet: which emphasizes fruits, vegetables, whole grains, legumes, fish, and other seafood; unsaturated fats such as olive oils; and low amounts of red meat, eggs, and sweets. A variation  of this, called MIND (Mediterranean-DASH Intervention for Neurodegenerative Delay) incorporates the DASH (Dietary Approaches to Stop Hypertension) diet, which has been shown to lower high blood pressure, a risk factor for Alzheimer's disease. More information at: ExitMarketing.de.  Aerobic exercise that improve heart health is also good for the mind.  [employer] on Aging have short videos for exercises that you can do at home: BlindWorkshop.com.pt Simple relaxation techniques may help relieve symptoms of anxiety Try to get back to completing activities or hobbies you enjoyed doing in the past. Learn to pace yourself through activities to decrease fatigue. Find out about some local support groups where you can share experiences with others. Try and achieve 7-8 hours of sleep at night.   Tasks to improve attention/working memory 1. Good sleep hygiene (7-8 hrs of sleep) 2. Learning a new skill (Painting, Carpentry, Pottery, new language, Knitting). 3.Cognitive exercises (keep a daily journal, Puzzles) 4. Physical exercise and training  (30 min/day X 4 days week) 5. Being on Antidepressant if needed 6.Yoga, Meditation, Tai Chi 7. Decrease alcohol intake 8.Have a clear schedule and structure in daily routine   MIND Diet: The Mediterranean-DASH Diet Intervention for Neurodegenerative Delay, or MIND diet, targets the health of the aging brain. Research participants with the highest MIND diet scores had a significantly slower rate of cognitive decline compared with those with the lowest scores. The effects of the MIND diet on cognition showed greater effects than either the Mediterranean or the DASH diet alone.   The healthy items the MIND diet guidelines suggest include:   3+ servings a day of whole grains 1+ servings a day of vegetables (other than green leafy) 6+ servings a week of green leafy vegetables 5+  servings a week of nuts 4+ meals a week of beans 2+ servings a week of berries 2+ meals a week of poultry 1+ meals a week of fish Mainly olive oil if added fat is used   The unhealthy items, which are higher in saturated and trans fat, include: Less than 5 servings a week of pastries and sweets Less than 4 servings a week of red meat (including beef, pork, lamb, and products made from these meats) Less than one serving a week of cheese and fried foods Less than 1 tablespoon a day of butter/stick margarine

## 2023-03-30 NOTE — Progress Notes (Signed)
 Chief Complaint  Patient presents with   Follow-up    Pt in 1 Pt here for cpap amd memory f/u Pt states uncomfortable due to hose getting tangle up during the night Pt states memory same since last visit      HISTORY OF PRESENT ILLNESS:  04/04/23 ALL:  Ann Washington is a 73 y.o. female here today for follow up for OSA on CPAP and memory loss. Neurocog eval concerning for LBD vs FTD. She was last seen by Dr Delena Bali 09/2022 and felt memory was back to baseline with no recent episodes of confusion. She was interested in a dental device and advised to discuss with Dr Frances Furbish. Dr Frances Furbish offered referral but recommended she continue CPAP.   Since, she reports doing fairly well. Memory is stable. She denies difficulty driving, managing home or finances, managing medications or performing ADLs. She lives with her husband. She does not feel he is concerned about her memory. She denies episodes of confusion. No concerns of hallucinations.   She continues CPAP nightly but admits that she does not like using it. She feels she fights with the hose at night. She is not sure she is getting restful sleep. She has difficulty with congestion. She was seen by ENT and diagnosed with deviated septum. She is considering surgery. She denies concerns with machine or supplies.      HISTORY (copied from Dr Quentin Mulling previous note)  73 year old female with a history of OSA, HTN, GERD, CKD 3, HLD, depression, glaucoma who follows in clinic for memory loss. MRI/MRA brain 06/16/21 were unremarkable other than for mild chronic microvascular ischemic changes. EEG 09/07/21 was normal. She underwent neuropsychological testing in November 2023, which was concerning for Lewy Body disease vs FTD. DaTSCAN 01/14/22 was equivocal (showed mild asymmetry with increased activity in left striatum vs right). PET scan 02/15/22 did not show any decreased cortical metabolism, and was not suggestive of Alzheimer's disease.    Interval  History: Memory is stable to slightly improved since her last visit. Has had no more episodes of confusion. She is driving short distances again. Continues to suffer from anxiety and sees a therapist for this. Denies tremor, gait imbalance, or falls.    Her primary concern today is her sleep. She is using an AutoPAP which she finds very uncomfortable and would like to know if there are any alternative options.  HISTORY (copied from Dr Teofilo Pod previous note)  Ann Washington is a 73 year old female with an underlying medical history of hypertension, hyperlipidemia, memory loss, anxiety, depression, reflux disease, glaucoma, and overweight state, who presents for follow-up consultation of her obstructive sleep apnea after interim testing and starting home AutoPap therapy. The patient is unaccompanied today (her husband had to leave to go to another appointment). I first met her at the request of her primary care provider on 02/22/2022, which time she reported snoring and difficulty sleeping. She was advised to proceed with a sleep study. She had a home sleep test on 03/30/2022 which indicated severe obstructive sleep apnea with an AHI of 56.1/h, O2 nadir 52% with time below or at 88% saturation of over 18 minutes for the night. Snoring ranged from mild to moderate. She was advised to proceed with home AutoPap therapy. Her set up date was 04/27/2022. She has a ResMed air sense 10 AutoSet machine. Her DME company is [employer].   I reviewed her AutoPap compliance data from 06/05/2022 through 07/04/2022, which is a total of 30 days,  during which time she used her machine every night with percent use days greater than 4 hours at 90%, indicating excellent compliance with an average usage of 6 hours and 45 minutes, residual AHI at goal at 1.3/h, 95th percentile of pressure at 10.8 cm with a range of 5 to 11 cm with EPR of 3. Leak on the higher side most nights with a 95th percentile at 30.5 L/min. She reports still adjusting  to treatment, she finds the pressure tolerable but the mask is uncomfortable and dislodges, she has to adjust it, it leaks. She has not gotten in touch with her DME provider yet. She uses an under the nose nasal cushion interface. She feels that the temperature is too high, has not been able to down adjust the temperature and would like a cool humidity. She is willing to make an appointment with her DME provider for mask fit and continue to work on using her machine. We talked about her home sleep test results and reviewed her compliance data in detail today.    REVIEW OF SYSTEMS: Out of a complete 14 system review of symptoms, the patient complains only of the following symptoms, difficulty with CPAP, allergies, and all other reviewed systems are negative.  ESS 6/24   ALLERGIES: Allergies  Allergen Reactions   Amlodipine Besy-Benazepril Hcl Other (See Comments)    Edema to ankle with redness/ but now she is tolerating the current regiment   Pravastatin     myalgias   Rosuvastatin     Severe fatigue when went to daily      HOME MEDICATIONS: Outpatient Medications Prior to Visit  Medication Sig Dispense Refill   amLODipine-valsartan (EXFORGE) 5-320 MG tablet Take 1 tablet by mouth daily. 90 tablet 3   aspirin EC 81 MG tablet Take 1 tablet (81 mg total) by mouth daily. Swallow whole. 30 tablet 12   estradiol (VIVELLE-DOT) 0.05 MG/24HR patch Place 1 patch onto the skin 2 (two) times a week.     ezetimibe (ZETIA) 10 MG tablet Take 1 tablet (10 mg total) by mouth daily. 90 tablet 3   latanoprost (XALATAN) 0.005 % ophthalmic solution 1 drop at bedtime.     magnesium (MAGTAB) 84 MG ( ) TBCR SR tablet Take 275 mg by mouth daily.     Multiple Vitamin (MULTIVITAMIN) tablet Take 1 tablet by mouth daily.     omeprazole (PRILOSEC) 20 MG capsule Take 20 mg by mouth daily.     progesterone (PROMETRIUM) 200 MG capsule TAKE 1 CAPSULE BY MOUTH EVERY DAY AT BEDTIME FOR 30 DAYS     Vitamin D,  Ergocalciferol, (DRISDOL) 1.25 MG (50000 UNIT) CAPS capsule TAKE 1 CAPSULE (50,000 UNITS TOTAL) BY MOUTH EVERY 7 (SEVEN) DAYS 13 capsule 1   fluticasone (FLONASE) 50 MCG/ACT nasal spray SPRAY 2 SPRAYS INTO EACH NOSTRIL EVERY DAY 48 mL 3   No facility-administered medications prior to visit.     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   Required high dose PPI, now on prilosec     GAD (generalized anxiety disorder)    GERD (gastroesophageal reflux disease)    Glaucoma    Hyperlipidemia    Hypertension    Insomnia    MCI (mild cognitive impairment)    Snoring 03/08/2008   Tested 2007 per patient-unrevealing for sleep apnea  PAST SURGICAL HISTORY: Past Surgical History:  Procedure Laterality Date   CATARACT EXTRACTION     late 2019   COLONOSCOPY     COLPOSCOPY     age 49   GYNECOLOGIC CRYOSURGERY     age 69   HAMMER TOE SURGERY     TENDON REPAIR Left    TUBAL LIGATION       FAMILY HISTORY: Family History  Problem Relation Age of Onset   Alzheimer's disease Mother    Hip fracture Mother    Osteoporosis Mother    Deep vein thrombosis Father    Heart disease Father        MI 15s, nonsmoker   Diabetes Sister    Hypertension Sister    Breast cancer Sister        92s   Arthritis Sister    Hypertension Brother    Diabetes Maternal Grandmother    Stroke Maternal Grandfather    Depression Paternal Grandmother    Suicidality Paternal Grandfather    Depression Paternal Grandfather    Colon cancer Neg Hx    Sleep apnea Neg Hx      SOCIAL HISTORY: Social History   Socioeconomic History   Marital status: Married    Spouse name: Aurther Loft   Number of children: 3   Years of education: Not on file   Highest education level: Some college, no degree  Occupational History   Not on file  Tobacco Use   Smoking status: Never    Smokeless tobacco: Never  Vaping Use   Vaping status: Never Used  Substance and Sexual Activity   Alcohol use: No   Drug use: No   Sexual activity: Yes    Birth control/protection: Surgical  Other Topics Concern   Not on file  Social History Narrative   Married (husband patient at [facility]). 3 children. 7 grandkids.       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       Hobbies: time with grandkids, walk when weather nice, reading, tv      Caffiene: 2 cups am, decaf occasional at night.    Social Drivers of Corporate investment banker Strain: Low Risk  (02/28/2023)   Overall Financial Resource Strain (CARDIA)    Difficulty of Paying Living Expenses: Not hard at all  Food Insecurity: No Food Insecurity (02/28/2023)   Hunger Vital Sign    Worried About Running Out of Food in the Last Year: Never true    Ran Out of Food in the Last Year: Never true  Transportation Needs: No Transportation Needs (02/28/2023)   PRAPARE - Administrator, Civil Service (Medical): No    Lack of Transportation (Non-Medical): No  Physical Activity: Unknown (02/28/2023)   Exercise Vital Sign    Days of Exercise per Week: Patient declined    Minutes of Exercise per Session: Not on file  Stress: No Stress Concern Present (02/28/2023)   [employer] of Occupational Health - Occupational Stress Questionnaire    Feeling of Stress : Only a little  Social Connections: Socially Integrated (02/28/2023)   Social Connection and Isolation Panel [NHANES]    Frequency of Communication with Friends and Family: More than three times a week    Frequency of Social Gatherings with Friends and Family: Once a week    Attends Religious Services: More than 4 times per year    Active Member of [employer] or Organizations: Yes    Attends [employer]  or Organization Meetings: More than 4 times per year    Marital Status: Married  Catering manager Violence: Not At Risk (03/02/2023)   Humiliation,  Afraid, Rape, and Kick questionnaire    Fear of Current or Ex-Partner: No    Emotionally Abused: No    Physically Abused: No    Sexually Abused: No     PHYSICAL EXAM  Vitals:   04/04/23 1323  BP: 111/71  Pulse: 69  Weight: 157 lb (71.2 kg)  Height: 5\' 1"  (1.549 m)   Body mass index is 29.66 kg/m.  Generalized: Well developed, in no acute distress  Cardiology: normal rate and rhythm, no murmur auscultated  Respiratory: clear to auscultation bilaterally    Neurological examination  Mentation: Alert oriented to time, place, history taking. Follows all commands speech and language fluent Cranial nerve II-XII: Pupils were equal round reactive to light. Extraocular movements were full, visual field were full on confrontational test. Facial sensation and strength were normal. Uvula tongue midline. Head turning and shoulder shrug  were normal and symmetric. Motor: The motor testing reveals 5 over 5 strength of all 4 extremities. Good symmetric motor tone is noted throughout.  Gait and station: Gait is normal.    DIAGNOSTIC DATA (LABS, IMAGING, TESTING) - I reviewed patient records, labs, notes, testing and imaging myself where available.  Lab Results  Component Value Date   WBC 8.5 11/30/2022   HGB 12.8 11/30/2022   HCT 38.8 11/30/2022   MCV 92.9 11/30/2022   PLT 277.0 11/30/2022      Component Value Date/Time   NA 138 11/30/2022 1159   K 3.9 11/30/2022 1159   CL 105 11/30/2022 1159   CO2 26 11/30/2022 1159   GLUCOSE 100 (H) 11/30/2022 1159   BUN 18 11/30/2022 1159   CREATININE 0.90 11/30/2022 1159   CREATININE 0.78 10/12/2019 0951   CALCIUM 10.4 11/30/2022 1159   PROT 7.9 11/30/2022 1159   ALBUMIN 4.4 11/30/2022 1159   AST 18 11/30/2022 1159   ALT 15 11/30/2022 1159   ALKPHOS 63 11/30/2022 1159   BILITOT 0.6 11/30/2022 1159   GFRNONAA >60 02/22/2021 1450   GFRNONAA 77 10/12/2019 0951   GFRAA 89 10/12/2019 0951   Lab Results  Component Value Date   CHOL 204  (H) 11/30/2022   HDL 61.20 11/30/2022   LDLCALC 123 (H) 11/30/2022   LDLDIRECT 131.0 02/18/2022   TRIG 99.0 11/30/2022   CHOLHDL 3 11/30/2022   Lab Results  Component Value Date   HGBA1C 5.6 11/30/2022   Lab Results  Component Value Date   VITAMINB12 >1537 (H) 11/30/2022   Lab Results  Component Value Date   TSH 2.50 04/26/2022       04/01/2016    9:08 AM  MMSE - Mini Mental State Exam  Not completed: --        04/04/2023    1:27 PM 10/21/2022    3:20 PM 03/17/2022   11:34 AM 08/18/2021   11:33 AM  Montreal Cognitive Assessment   Visuospatial/ Executive (0/5) 2 4 4 2   Naming (0/3) 3 3 3 3   Attention: Read list of digits (0/2) 2 2 2 2   Attention: Read list of letters (0/1) 1 1 1 1   Attention: Serial 7 subtraction starting at 100 (0/3) 3 3 3 3   Language: Repeat phrase (0/2) 2 2 2  0  Language : Fluency (0/1) 1 0 1 0  Abstraction (0/2) 2 2 2 2   Delayed Recall (0/5) 3 5 2  3  Orientation (0/6) 6 6 6 6   Total 25 28 26 22   Adjusted Score (based on education)   11      ASSESSMENT AND PLAN  73 y.o. year old female  has a past medical history of CIN I (cervical intraepithelial neoplasia I), Depression, DYSPHAGIA PHARYNGEAL PHASE (03/08/2008), GAD (generalized anxiety disorder), GERD (gastroesophageal reflux disease), Glaucoma, Hyperlipidemia, Hypertension, Insomnia, MCI (mild cognitive impairment), and Snoring (03/08/2008). here with    Severe obstructive sleep apnea  OSA on CPAP - Plan: For home use only DME continuous positive airway pressure (CPAP)  Memory loss  Ann Washington reports doing fairly well. She feels memory is stable. We discussed repeating neurocognitive evaluation now that apnea is managed but she is not interested at this time. MOCA shows deficit in visuospatial/executive functioning and recall. We will continue to monitor. I have encouraged her to continue daily use of CPAP therapy for at least 4 hours. We have discussed relationship of deviated  septum as it may be a partial factor in sleep apnea but not likely to be sole cause. She may consider second opinion with ENT if she wishes. Healthy lifestyle habits encouraged. Memory compensation strategies advised. She will follow up with PCP as directed. She will return to see me in 1 year, sooner if needed. She verbalizes understanding and agreement with this plan.   Orders Placed This Encounter  Procedures   For home use only DME continuous positive airway pressure (CPAP)    Heated Humidity with all supplies as needed    Length of Need:   Lifetime    Patient has OSA or probable OSA:   Yes    Is the patient currently using CPAP in the home:   Yes    Settings:   Other see comments    CPAP supplies needed:   Mask, headgear, cushions, filters, heated tubing and water chamber     No orders of the defined types were placed in this encounter.   I spent 30 minutes of face-to-face and non-face-to-face time with patient.  This included previsit chart review, lab review, study review, order entry, electronic health record documentation, patient education.   Shawnie Dapper, MSN, FNP-C 04/04/2023, 2:34 PM  [facility] [address], [address] [place], [place] [address] [phone]

## 2023-04-04 ENCOUNTER — Ambulatory Visit: Admitting: Family Medicine

## 2023-04-04 ENCOUNTER — Encounter: Payer: Self-pay | Admitting: Family Medicine

## 2023-04-04 VITALS — BP 111/71 | HR 69 | Ht 61.0 in | Wt 157.0 lb

## 2023-04-04 DIAGNOSIS — R413 Other amnesia: Secondary | ICD-10-CM

## 2023-04-04 DIAGNOSIS — G4733 Obstructive sleep apnea (adult) (pediatric): Secondary | ICD-10-CM

## 2023-04-29 ENCOUNTER — Ambulatory Visit: Payer: Medicare Other | Admitting: Family Medicine

## 2023-05-03 ENCOUNTER — Encounter: Payer: Self-pay | Admitting: Family Medicine

## 2023-05-03 ENCOUNTER — Ambulatory Visit: Admitting: Family Medicine

## 2023-05-03 VITALS — BP 138/78 | HR 86 | Temp 98.1°F | Ht 61.0 in | Wt 154.8 lb

## 2023-05-03 DIAGNOSIS — E782 Mixed hyperlipidemia: Secondary | ICD-10-CM | POA: Diagnosis not present

## 2023-05-03 DIAGNOSIS — F3342 Major depressive disorder, recurrent, in full remission: Secondary | ICD-10-CM | POA: Diagnosis not present

## 2023-05-03 DIAGNOSIS — G720 Drug-induced myopathy: Secondary | ICD-10-CM

## 2023-05-03 DIAGNOSIS — I10 Essential (primary) hypertension: Secondary | ICD-10-CM

## 2023-05-03 DIAGNOSIS — R5383 Other fatigue: Secondary | ICD-10-CM | POA: Diagnosis not present

## 2023-05-03 LAB — CBC WITH DIFFERENTIAL/PLATELET
Basophils Absolute: 0.1 10*3/uL (ref 0.0–0.1)
Basophils Relative: 0.6 % (ref 0.0–3.0)
Eosinophils Absolute: 0.3 10*3/uL (ref 0.0–0.7)
Eosinophils Relative: 3.8 % (ref 0.0–5.0)
HCT: 40.4 % (ref 36.0–46.0)
Hemoglobin: 13.9 g/dL (ref 12.0–15.0)
Lymphocytes Relative: 20.5 % (ref 12.0–46.0)
Lymphs Abs: 1.8 10*3/uL (ref 0.7–4.0)
MCHC: 34.5 g/dL (ref 30.0–36.0)
MCV: 93.8 fl (ref 78.0–100.0)
Monocytes Absolute: 0.7 10*3/uL (ref 0.1–1.0)
Monocytes Relative: 7.9 % (ref 3.0–12.0)
Neutro Abs: 6 10*3/uL (ref 1.4–7.7)
Neutrophils Relative %: 67.2 % (ref 43.0–77.0)
Platelets: 268 10*3/uL (ref 150.0–400.0)
RBC: 4.31 Mil/uL (ref 3.87–5.11)
RDW: 12.7 % (ref 11.5–15.5)
WBC: 8.9 10*3/uL (ref 4.0–10.5)

## 2023-05-03 LAB — COMPREHENSIVE METABOLIC PANEL WITH GFR
ALT: 17 U/L (ref 0–35)
AST: 21 U/L (ref 0–37)
Albumin: 4.5 g/dL (ref 3.5–5.2)
Alkaline Phosphatase: 63 U/L (ref 39–117)
BUN: 16 mg/dL (ref 6–23)
CO2: 23 meq/L (ref 19–32)
Calcium: 9.7 mg/dL (ref 8.4–10.5)
Chloride: 105 meq/L (ref 96–112)
Creatinine, Ser: 0.82 mg/dL (ref 0.40–1.20)
GFR: 70.51 mL/min (ref >60.00)
Glucose, Bld: 108 mg/dL — ABNORMAL HIGH (ref 70–99)
Potassium: 4.2 meq/L (ref 3.5–5.1)
Sodium: 137 meq/L (ref 135–145)
Total Bilirubin: 0.7 mg/dL (ref 0.2–1.2)
Total Protein: 7.8 g/dL (ref 6.0–8.3)

## 2023-05-03 LAB — TSH: TSH: 2.39 u[IU]/mL (ref 0.35–5.50)

## 2023-05-03 NOTE — Progress Notes (Signed)
 Phone [phone] In person visit   Subjective:   Ann Washington is a 73 y.o. year old very pleasant female patient who presents for/with See problem oriented charting Chief Complaint  Patient presents with   Medical Management of Chronic Issues   Hypertension   Hyperlipidemia    Past Medical History-  Patient Active Problem List   Diagnosis Date Noted   Glaucoma 03/08/2014    Priority: Medium    Insomnia 03/08/2014    Priority: Medium    Hyperglycemia 12/09/2009    Priority: Medium    Hyperlipidemia 10/13/2006    Priority: Medium    Major depression, recurrent, full remission (HCC) 10/13/2006    Priority: Medium    Hypertension, essential 10/10/2006    Priority: Medium    Normocytic anemia 09/12/2014    Priority: Low   Stress incontinence, female 10/12/2011    Priority: Low   Vaginal atrophy 05/27/2011    Priority: Low   Obesity 10/11/2008    Priority: Low   GERD- failed h2 blocker 08/25/2007    Priority: Low   Chronic rhinitis 11/04/2022   Deviated nasal septum 11/04/2022   Hypertrophy of nasal turbinates 11/04/2022   Osteoarthritis of ankle and foot 12/08/2018   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 90 tablet 3   aspirin EC 81 MG tablet Take 1 tablet (81 mg total) by mouth daily. Swallow whole. 30 tablet 12   estradiol (VIVELLE-DOT) 0.05 MG/24HR patch Place 1 patch onto the skin 2 (two) times a week.     ezetimibe (ZETIA) 10 MG tablet Take 1 tablet (10 mg total) by mouth daily. 90 tablet 3   latanoprost (XALATAN) 0.005 % ophthalmic solution 1 drop at bedtime.     magnesium (MAGTAB) 84 MG ( ) TBCR SR tablet Take 275 mg by mouth daily.     Multiple Vitamin (MULTIVITAMIN) tablet Take 1 tablet by mouth daily.     omeprazole (PRILOSEC) 20 MG capsule Take 20 mg by mouth daily.      progesterone (PROMETRIUM) 200 MG capsule TAKE 1 CAPSULE BY MOUTH EVERY DAY AT BEDTIME FOR 30 DAYS     Vitamin D, Ergocalciferol, (DRISDOL) 1.25 MG (50000 UNIT) CAPS capsule TAKE 1 CAPSULE (50,000 UNITS TOTAL) BY MOUTH EVERY 7 (SEVEN) DAYS 13 capsule 1   No current facility-administered medications for this visit.     Objective:  BP 138/78   Pulse 86   Temp 98.1 F (36.7 C)   Ht 5\' 1"  (1.549 m)   Wt 154 lb 12.8 oz (70.2 kg)   SpO2 98%   BMI 29.25 kg/m  Gen: NAD, resting comfortably CV: RRR no murmurs rubs or gallops Lungs: CTAB no crackles, wheeze, rhonchi Ext: trace edema Skin: warm, dry     Assessment and Plan   # Memory loss-released from neurology 10/21/22-has had DAT scan and PET metabolic which do not clearly point toward cause  -improvemetn at neuro visit 03/17/22- felt possibly related to reducing psychiatric medications. Also reeasurring visit 10/21/22- reports memory continues to do well off of psychiatric medicines  #OSA- compliant with CPAP starting 2024 - using this while waiting - feels allergies worse on this  -wants to trial oral appliance through Dr. Christell Constant with [facility]- goes on 21st to be fitted- apparently $300 and only may last a yaer - Dr. Suszanne Conners had been concerned for deviated septum but  Dr. Christell Constant thought acceptable- surgery not needed  #we had considered CBT- I at last visit she thought this over and ultimately opted out.   #Hypertension S: Compliant with amlodipine-valsartan 5-320 mg.  Knows to avoid NSAIDs-Celebrex prescribed by Dr. Al Corpus in past is no longer on  Valsartan is protective in case there is a proteinuric element. BP Readings from Last 3 Encounters:  05/03/23 138/78  04/04/23 111/71  11/30/22 132/78  A/P: blood pressure higher than usual today but home readings still looking really good like 112/60 most recently- continue current medications   - CKD III- previously noted but remove from list- improved off nsaids  #Hyperlipidemia #concern for  TIA 05/26/21- with aphasia- MRI, MRA, holter, echo largely reassuring other than LVH (cardiology saw) S: medication: Trouble with statins, aspirin 81 mg daily , trial zetia 10 mg 2024 -  Myalgias on pravastatin in the past and rosuvastatin Lab Results  Component Value Date   CHOL 204 (H) 11/30/2022   HDL 61.20 11/30/2022   LDLCALC 123 (H) 11/30/2022   LDLDIRECT 131.0 02/18/2022   TRIG 99.0 11/30/2022   CHOLHDL 3 11/30/2022  A/P: cholesterol mildly elevated in past- has not changed medicine- continue to work on healthy eating and regular exercise and recheck next visit    #Hyperglycemia S: Increased risk of diabetes.  No current medication.  Lifestyle modification only. Lab Results  Component Value Date   HGBA1C 5.6 11/30/2022   HGBA1C 6.0 02/18/2022   HGBA1C 6.0 04/23/2021  A/P: prior prediabetes- too soon for repeat- check next visit    #GERD S: Has been well controlled on Prilosec. zantac was not helpful for her.  A/P: stable- continue current medicines    #Depression in full remission- doing well without medications.  # Insomnia- ongoing issues. Declined CBT but improving some- has failed trazodone and ramelteon and Ambien not ideal with prior memory issues  #Vitamin D deficiency S: Medication: multivitamin with D- finished high dose previously Last vitamin D Lab Results  Component Value Date   VD25OH 35.54 11/30/2022        A/P: levels looked good but gynecology wants her over 69- she is going to continue current medications for now    #ongoing fatigue- no chest pain or shortness of breath- wants to update labs to make sure stable  #overdue for colonoscolpy- plans to call to schedule with Dr. Netty Starring  Recommended follow up: Return in about 6 months (around 11/02/2023) for physical or sooner if needed.Schedule b4 you leave. Future Appointments  Date Time Provider Department Center  04/09/2024  1:00 PM Lomax, Amy, NP [facility] None    Lab/Order associations: coffee  with milk    ICD-10-CM   1. Mixed hyperlipidemia  E78.2     2. Major depression, recurrent, full remission (HCC)  F33.42     3. 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 Fatigue, unspecified type  R53.83 Comprehensive metabolic panel with GFR    CBC with Differential/Platelet    TSH      No orders of the defined types were placed in this encounter.   Return precautions advised.  Tana Conch, MD

## 2023-05-03 NOTE — Patient Instructions (Addendum)
 Health Maintenance Due  Topic Date Due   Colonoscopy  08/10/2022  [facility] contact Please call to schedule visit and/or procedure IF you do not hear within a week Address: [address] [place], [place], [place] [address] Phone: [phone]  Please stop by lab before you go If you have mychart- we will send your results within 3 business days of [place] receiving them.  If you do not have mychart- we will call you about results within 5 business days of [place] receiving them.  *please also note that you will see labs on mychart as soon as they post. I will later go in and write notes on them- will say "notes from Dr. Durene Cal"   Recommended follow up: Return in about 6 months (around 11/02/2023) for physical or sooner if needed.Schedule b4 you leave.

## 2023-05-03 NOTE — Assessment & Plan Note (Signed)
 Removing this from problem list- GFR improved off of prior Celebrex with multiple readings over 60 lately

## 2023-05-25 ENCOUNTER — Encounter: Payer: Self-pay | Admitting: Gastroenterology

## 2023-06-05 ENCOUNTER — Other Ambulatory Visit: Payer: Self-pay | Admitting: Family Medicine

## 2023-07-04 ENCOUNTER — Encounter: Payer: Self-pay | Admitting: Gastroenterology

## 2023-07-04 ENCOUNTER — Ambulatory Visit (AMBULATORY_SURGERY_CENTER)

## 2023-07-04 VITALS — Ht 61.0 in | Wt 159.0 lb

## 2023-07-04 DIAGNOSIS — Z1211 Encounter for screening for malignant neoplasm of colon: Secondary | ICD-10-CM

## 2023-07-04 MED ORDER — NA SULFATE-K SULFATE-MG SULF 17.5-3.13-1.6 GM/177ML PO SOLN: 1.0000 | Freq: Once | ORAL | 0 refills | Status: AC

## 2023-07-04 NOTE — Progress Notes (Signed)

## 2023-07-15 ENCOUNTER — Telehealth: Payer: Self-pay | Admitting: Gastroenterology

## 2023-07-15 NOTE — Telephone Encounter (Signed)
 Patient requesting f/u call to discuss prep instructions. Please advise.   Thank you

## 2023-07-18 ENCOUNTER — Ambulatory Visit: Payer: Medicare Other | Admitting: Family Medicine

## 2023-07-20 ENCOUNTER — Other Ambulatory Visit: Payer: Self-pay

## 2023-07-20 ENCOUNTER — Telehealth: Payer: Self-pay

## 2023-07-20 ENCOUNTER — Ambulatory Visit: Admitting: Gastroenterology

## 2023-07-20 ENCOUNTER — Encounter: Payer: Self-pay | Admitting: Gastroenterology

## 2023-07-20 VITALS — BP 107/42 | HR 76 | Temp 98.2°F | Resp 19 | Ht 61.0 in | Wt 159.0 lb

## 2023-07-20 DIAGNOSIS — R159 Full incontinence of feces: Secondary | ICD-10-CM

## 2023-07-20 DIAGNOSIS — K635 Polyp of colon: Secondary | ICD-10-CM | POA: Diagnosis not present

## 2023-07-20 DIAGNOSIS — K644 Residual hemorrhoidal skin tags: Secondary | ICD-10-CM

## 2023-07-20 DIAGNOSIS — K5730 Diverticulosis of large intestine without perforation or abscess without bleeding: Secondary | ICD-10-CM | POA: Diagnosis not present

## 2023-07-20 DIAGNOSIS — Z1211 Encounter for screening for malignant neoplasm of colon: Secondary | ICD-10-CM

## 2023-07-20 DIAGNOSIS — R32 Unspecified urinary incontinence: Secondary | ICD-10-CM

## 2023-07-20 DIAGNOSIS — D123 Benign neoplasm of transverse colon: Secondary | ICD-10-CM

## 2023-07-20 DIAGNOSIS — K648 Other hemorrhoids: Secondary | ICD-10-CM

## 2023-07-20 MED ORDER — SODIUM CHLORIDE 0.9 % IV SOLN: 500.0000 mL | Freq: Once | INTRAVENOUS | Status: DC

## 2023-07-20 NOTE — Patient Instructions (Signed)
 Please read handouts provided. Continue present medications. Await pathology results. Resume previous diet. Daily fiber supplement such as Citrucel to bulk stools. Consideration for pelvic floor PT.  YOU HAD AN ENDOSCOPIC PROCEDURE TODAY AT THE [facility]:   Refer to the procedure report that was given to you for any specific questions about what was found during the examination.  If the procedure report does not answer your questions, please call your gastroenterologist to clarify.  If you requested that your care partner not be given the details of your procedure findings, then the procedure report has been included in a sealed envelope for you to review at your convenience later.  YOU SHOULD EXPECT: Some feelings of bloating in the abdomen. Passage of more gas than usual.  Walking can help get rid of the air that was put into your GI tract during the procedure and reduce the bloating. If you had a lower endoscopy (such as a colonoscopy or flexible sigmoidoscopy) you may notice spotting of blood in your stool or on the toilet paper. If you underwent a bowel prep for your procedure, you may not have a normal bowel movement for a few days.  Please Note:  You might notice some irritation and congestion in your nose or some drainage.  This is from the oxygen used during your procedure.  There is no need for concern and it should clear up in a day or so.  SYMPTOMS TO REPORT IMMEDIATELY:  Following lower endoscopy (colonoscopy or flexible sigmoidoscopy):  Excessive amounts of blood in the stool  Significant tenderness or worsening of abdominal pains  Swelling of the abdomen that is new, acute  Fever of 100F or higher.  For urgent or emergent issues, a gastroenterologist can be reached at any hour by calling [phone]. Do not use MyChart messaging for urgent concerns.    DIET:  We do recommend a small meal at first, but then you may proceed to your regular diet.  Drink plenty  of fluids but you should avoid alcoholic beverages for 24 hours.  ACTIVITY:  You should plan to take it easy for the rest of today and you should NOT DRIVE or use heavy machinery until tomorrow (because of the sedation medicines used during the test).    FOLLOW UP: Our staff will call the number listed on your records the next business day following your procedure.  We will call around 7:15- 8:00 am to check on you and address any questions or concerns that you may have regarding the information given to you following your procedure. If we do not reach you, we will leave a message.     If any biopsies were taken you will be contacted by phone or by letter within the next 1-3 weeks.  Please call us  at [phone] if you have not heard about the biopsies in 3 weeks.    SIGNATURES/CONFIDENTIALITY: You and/or your care partner have signed paperwork which will be entered into your electronic medical record.  These signatures attest to the fact that that the information above on your After Visit Summary has been reviewed and is understood.  Full responsibility of the confidentiality of this discharge information lies with you and/or your care-partner.

## 2023-07-20 NOTE — Telephone Encounter (Signed)
-----   Message from Elspeth SHAUNNA Naval sent at 07/20/2023 12:18 PM EDT ----- Regarding: referral to pelvic floor PT Can someone help place a referral to pelvic floor PT for this patient for both fecal and urinary incontinence? Thanks

## 2023-07-20 NOTE — Op Note (Signed)
 [facility] Patient Name: Ann Washington Procedure Date: 07/20/2023 9:48 AM MRN: [id number] Endoscopist: Elspeth P. Leigh , MD, [id number] Age: 73 Referring MD:  Date of Birth: [birth year] Gender: Female Account #: [id number] Procedure:                Colonoscopy Indications:              Screening for colorectal malignant neoplasm - last                            exam 07/2012 - Dr. Teressa - no polyps. Patient                            endorses occasional symptoms of leakage, also has                            some stress urinary incontinence. Medicines:                Monitored Anesthesia Care Procedure:                Pre-Anesthesia Assessment:                           - Prior to the procedure, a History and Physical                            was performed, and patient medications and                            allergies were reviewed. The patient's tolerance of                            previous anesthesia was also reviewed. The risks                            and benefits of the procedure and the sedation                            options and risks were discussed with the patient.                            All questions were answered, and informed consent                            was obtained. Prior Anticoagulants: The patient has                            taken no anticoagulant or antiplatelet agents. ASA                            Grade Assessment: II - A patient with mild systemic                            disease. After reviewing the risks and benefits,  the patient was deemed in satisfactory condition to                            undergo the procedure.                           After obtaining informed consent, the colonoscope                            was passed under direct vision. Throughout the                            procedure, the patient's blood pressure, pulse, and                            oxygen saturations  were monitored continuously. The                            PCF-[id number] Slim SN [id number] was introduced through                            the anus and advanced to the the cecum, identified                            by appendiceal orifice and ileocecal valve. The                            colonoscopy was performed without difficulty. The                            patient tolerated the procedure well. The quality                            of the bowel preparation was good. The ileocecal                            valve, appendiceal orifice, and rectum were                            photographed. Scope In: 9:54:33 AM Scope Out: 10:10:51 AM Scope Withdrawal Time: 0 hours 11 minutes 32 seconds  Total Procedure Duration: 0 hours 16 minutes 18 seconds  Findings:                 Skin tags were found on perianal exam.                           Multiple diverticula were found in the transverse                            colon and left colon.                           A diminutive polyp was found in the transverse  colon. The polyp was sessile. The polyp was removed                            with a cold snare. Resection and retrieval were                            complete.                           Internal hemorrhoids were found during retroflexion.                           The exam was otherwise without abnormality. Complications:            No immediate complications. Estimated blood loss:                            Minimal. Estimated Blood Loss:     Estimated blood loss was minimal. Impression:               - Perianal skin tags found on perianal exam.                           - Diverticulosis in the transverse colon and in the                            left colon.                           - One diminutive polyp in the transverse colon,                            removed with a cold snare. Resected and retrieved.                           - Internal  hemorrhoids.                           - The examination was otherwise normal. Recommendation:           - Patient has a contact number available for                            emergencies. The signs and symptoms of potential                            delayed complications were discussed with the                            patient. Return to normal activities tomorrow.                            Written discharge instructions were provided to the                            patient.                           -  Resume previous diet.                           - Continue present medications.                           - Await pathology results.                           - Daily fiber supplement such as Citrucel to bulk                            stools                           - Consideration for pelvic floor PT for fecal                            leakage / stress urinary incontinence, will discuss                            with the patient Elspeth SQUIBB. Mckyle Solanki, MD 07/20/2023 10:15:21 AM This report has been signed electronically.

## 2023-07-20 NOTE — Progress Notes (Signed)
 Sedate, gd SR, tolerated procedure well, VSS, report to RN

## 2023-07-20 NOTE — Progress Notes (Signed)
 Called to room to assist during endoscopic procedure.  Patient ID and intended procedure confirmed with present staff. Received instructions for my participation in the procedure from the performing physician.

## 2023-07-20 NOTE — Progress Notes (Signed)
 [facility] History and Physical   Primary Care Physician:  Ann Garnette KIDD, MD   Reason for Procedure:   Colon cancer screening  Plan:    colonoscopy     HPI: Ann Washington is a 73 y.o. female  here for colonoscopy screening - last exam 2014 - Dr. Teressa, no polyps.   Patient denies any bowel symptoms at this time other than occasional leakage. Also has urinary incontinence at times, stress incontinence. No family history of colon cancer known. Otherwise feels well without any cardiopulmonary symptoms.   I have discussed risks / benefits of anesthesia and endoscopic procedure with Ann Washington and they wish to proceed with the exams as outlined today.    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   Required high dose PPI, now on prilosec     GAD (generalized anxiety disorder)    GERD (gastroesophageal reflux disease)    Glaucoma    Hyperlipidemia    Hypertension    Insomnia    MCI (mild cognitive impairment)    Sleep apnea    Sleep study conducted   Snoring 03/08/2008   Tested 2007 per patient-unrevealing for sleep apnea      Past Surgical History:  Procedure Laterality Date   CATARACT EXTRACTION     late 2019   COLONOSCOPY     COLPOSCOPY     age 55   GYNECOLOGIC CRYOSURGERY     age 22   HAMMER TOE SURGERY     TENDON REPAIR Left    TUBAL LIGATION      Prior to Admission medications   Medication Sig Start Date End Date Taking? Authorizing Provider  amLODipine -valsartan  (EXFORGE ) 5-320 MG tablet TAKE 1 TABLET BY MOUTH EVERY DAY 06/06/23  Yes Ann Garnette KIDD, MD  Coenzyme Q10 (COQ10 PO) Take 1 capsule by mouth daily.   Yes [provider]  estradiol  (VIVELLE -DOT) 0.05 MG/24HR patch Place 1 patch onto the skin 2 (two) times a week.   Yes [provider]  ezetimibe  (ZETIA ) 10 MG tablet TAKE 1 TABLET BY MOUTH  EVERY DAY 06/06/23  Yes Ann Garnette KIDD, MD  latanoprost (XALATAN) 0.005 % ophthalmic solution 1 drop at bedtime.   Yes [provider]  magnesium (MAGTAB) 84 MG ( ) TBCR SR tablet Take 275 mg by mouth daily.   Yes [provider]  Multiple Vitamin (MULTIVITAMIN) tablet Take 1 tablet by mouth daily.   Yes [provider]  omeprazole (PRILOSEC) 20 MG capsule Take 20 mg by mouth daily.   Yes [provider]  OVER THE COUNTER MEDICATION Take 1 capsule by mouth daily. Vitamin B12   Yes [provider]  progesterone (PROMETRIUM) 200 MG capsule TAKE 1 CAPSULE BY MOUTH EVERY DAY AT BEDTIME FOR 30 DAYS 09/24/22  Yes [provider]  aspirin  EC 81 MG tablet Take 1 tablet (81 mg total) by mouth daily. Swallow whole. 08/14/21   Ann Garnette KIDD, MD  Vitamin D , Ergocalciferol , (DRISDOL ) 1.25 MG (50000 UNIT) CAPS capsule TAKE 1 CAPSULE (50,000 UNITS TOTAL) BY MOUTH EVERY 7 (SEVEN) DAYS Patient not taking: Reported on 07/20/2023 08/16/22   Ann Garnette KIDD, MD    Current Outpatient Medications  Medication Sig Dispense Refill   amLODipine -valsartan  (EXFORGE ) 5-320 MG tablet TAKE 1 TABLET BY MOUTH EVERY DAY 90 tablet 3   Coenzyme Q10 (COQ10 PO)  Take 1 capsule by mouth daily.     estradiol  (VIVELLE -DOT) 0.05 MG/24HR patch Place 1 patch onto the skin 2 (two) times a week.     ezetimibe  (ZETIA ) 10 MG tablet TAKE 1 TABLET BY MOUTH EVERY DAY 90 tablet 3   latanoprost (XALATAN) 0.005 % ophthalmic solution 1 drop at bedtime.     magnesium (MAGTAB) 84 MG ( ) TBCR SR tablet Take 275 mg by mouth daily.     Multiple Vitamin (MULTIVITAMIN) tablet Take 1 tablet by mouth daily.     omeprazole (PRILOSEC) 20 MG capsule Take 20 mg by mouth daily.     OVER THE COUNTER MEDICATION Take 1 capsule by mouth daily. Vitamin B12     progesterone (PROMETRIUM) 200 MG capsule TAKE 1 CAPSULE BY MOUTH EVERY DAY AT BEDTIME FOR 30 DAYS     aspirin  EC 81 MG tablet Take 1 tablet (81  mg total) by mouth daily. Swallow whole. 30 tablet 12   Vitamin D , Ergocalciferol , (DRISDOL ) 1.25 MG (50000 UNIT) CAPS capsule TAKE 1 CAPSULE (50,000 UNITS TOTAL) BY MOUTH EVERY 7 (SEVEN) DAYS (Patient not taking: Reported on 07/20/2023) 13 capsule 1   Current Facility-Administered Medications  Medication Dose Route Frequency Provider Last Rate Last Admin   0.9 %  sodium chloride  infusion  500 mL Intravenous Once Ann Washington, Ann SQUIBB, MD        Allergies as of 07/20/2023 - Review Complete 07/20/2023  Allergen Reaction Noted   Amlodipine  besy-benazepril hcl Other (See Comments) 10/10/2006   Pravastatin  Other (See Comments) 09/16/2021   Rosuvastatin  Other (See Comments) 09/16/2021    Family History  Problem Relation Age of Onset   Alzheimer's disease Mother    Hip fracture Mother    Osteoporosis Mother    Deep vein thrombosis Father    Heart disease Father        MI 70s, nonsmoker   Diabetes Sister    Hypertension Sister    Breast cancer Sister        56s   Arthritis Sister    Hypertension Brother    Diabetes Maternal Grandmother    Stroke Maternal Grandfather    Depression Paternal Grandmother    Suicidality Paternal Grandfather    Depression Paternal Grandfather    Colon cancer Neg Hx    Sleep apnea Neg Hx    Rectal cancer Neg Hx    Stomach cancer Neg Hx    Esophageal cancer Neg Hx     Social History   Socioeconomic History   Marital status: Married    Spouse name: Ann Washington   Number of children: 3   Years of education: Not on file   Highest education level: Some college, no degree  Occupational History   Not on file  Tobacco Use   Smoking status: Never   Smokeless tobacco: Never  Vaping Use   Vaping status: Never Used  Substance and Sexual Activity   Alcohol use: No   Drug use: No   Sexual activity: Yes    Birth control/protection: Surgical  Other Topics Concern   Not on file  Social History Narrative   Married (husband patient at [facility]). 3 children. 7 grandkids.       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       Hobbies: time with grandkids, walk when weather nice, reading, tv      Caffiene: 2 cups am, decaf occasional at night.    Social Drivers of Health  Financial Resource Strain: Low Risk  (02/28/2023)   Overall Financial Resource Strain (CARDIA)    Difficulty of Paying Living Expenses: Not hard at all  Food Insecurity: Low Risk  (05/16/2023)   Received from [facility]   Hunger Vital Sign    Within the past 12 months, you worried that your food would run out before you got money to buy more: Never true    Within the past 12 months, the food you bought just didn't last and you didn't have money to get more. : Never true  Transportation Needs: No Transportation Needs (05/16/2023)   Received from [facility]    In the past 12 months, has lack of reliable transportation kept you from medical appointments, meetings, work or from getting things needed for daily living? : No  Physical Activity: Unknown (02/28/2023)   Exercise Vital Sign    Days of Exercise per Week: Patient declined    Minutes of Exercise per Session: Not on file  Stress: No Stress Concern Present (02/28/2023)   [employer] of Occupational Health - Occupational Stress Questionnaire    Feeling of Stress : Only a little  Social Connections: Socially Integrated (02/28/2023)   Social Connection and Isolation Panel    Frequency of Communication with Friends and Family: More than three times a week    Frequency of Social Gatherings with Friends and Family: Once a week    Attends Religious Services: More than 4 times per year    Active Member of [employer] or Organizations: Yes    Attends Engineer, structural: More than 4 times per year    Marital Status: Married  Catering manager Violence: Not At Risk (03/02/2023)   Humiliation, Afraid, Rape, and Kick questionnaire    Fear of Current or Ex-Partner: No     Emotionally Abused: No    Physically Abused: No    Sexually Abused: No    Review of Systems: All other review of systems negative except as mentioned in the HPI.  Physical Exam: Vital signs BP (!) 148/78   Pulse 75   Temp 98.2 F (36.8 C) (Temporal)   Ht 5' 1 (1.549 m)   Wt 159 lb (72.1 kg)   SpO2 97%   BMI 30.04 kg/m   General:   Alert,  Well-developed, pleasant and cooperative in NAD Lungs:  Clear throughout to auscultation.   Heart:  Regular rate and rhythm Abdomen:  Soft, nontender and nondistended.   Neuro/Psych:  Alert and cooperative. Normal mood and affect. A and O x 3  Marcey Naval, MD [facility]

## 2023-07-20 NOTE — Progress Notes (Signed)
 SABRA

## 2023-07-20 NOTE — Progress Notes (Signed)
 Pt's states no medical or surgical changes since previsit or office visit.

## 2023-07-20 NOTE — Telephone Encounter (Signed)
Referral placed in epic as requested.

## 2023-07-21 ENCOUNTER — Telehealth: Payer: Self-pay | Admitting: *Deleted

## 2023-07-21 NOTE — Telephone Encounter (Signed)
 Post procedure follow up call placed, no answer and no VM.

## 2023-07-22 LAB — SURGICAL PATHOLOGY

## 2023-07-24 ENCOUNTER — Ambulatory Visit: Payer: Self-pay | Admitting: Gastroenterology

## 2023-11-02 ENCOUNTER — Ambulatory Visit: Admitting: Family Medicine

## 2023-11-02 ENCOUNTER — Encounter: Payer: Self-pay | Admitting: Family Medicine

## 2023-11-02 VITALS — BP 122/82 | HR 72 | Temp 97.6°F | Ht 61.0 in | Wt 156.6 lb

## 2023-11-02 DIAGNOSIS — E559 Vitamin D deficiency, unspecified: Secondary | ICD-10-CM | POA: Insufficient documentation

## 2023-11-02 DIAGNOSIS — R739 Hyperglycemia, unspecified: Secondary | ICD-10-CM

## 2023-11-02 DIAGNOSIS — F3342 Major depressive disorder, recurrent, in full remission: Secondary | ICD-10-CM | POA: Diagnosis not present

## 2023-11-02 DIAGNOSIS — I10 Essential (primary) hypertension: Secondary | ICD-10-CM | POA: Diagnosis not present

## 2023-11-02 DIAGNOSIS — Z131 Encounter for screening for diabetes mellitus: Secondary | ICD-10-CM

## 2023-11-02 DIAGNOSIS — Z0001 Encounter for general adult medical examination with abnormal findings: Secondary | ICD-10-CM | POA: Diagnosis not present

## 2023-11-02 DIAGNOSIS — G4700 Insomnia, unspecified: Secondary | ICD-10-CM

## 2023-11-02 DIAGNOSIS — Z0000 Encounter for general adult medical examination without abnormal findings: Secondary | ICD-10-CM | POA: Diagnosis not present

## 2023-11-02 DIAGNOSIS — E782 Mixed hyperlipidemia: Secondary | ICD-10-CM | POA: Diagnosis not present

## 2023-11-02 DIAGNOSIS — E663 Overweight: Secondary | ICD-10-CM

## 2023-11-02 LAB — LIPID PANEL
Cholesterol: 202 mg/dL — ABNORMAL HIGH (ref 0–200)
HDL: 61.4 mg/dL (ref >39.00)
LDL Cholesterol: 121 mg/dL — ABNORMAL HIGH (ref 0–99)
NonHDL: 140.72
Total CHOL/HDL Ratio: 3
Triglycerides: 99 mg/dL (ref 0.0–149.0)
VLDL: 19.8 mg/dL (ref 0.0–40.0)

## 2023-11-02 LAB — CBC WITH DIFFERENTIAL/PLATELET
Basophils Absolute: 0 K/uL (ref 0.0–0.1)
Basophils Relative: 0.5 % (ref 0.0–3.0)
Eosinophils Absolute: 0.2 K/uL (ref 0.0–0.7)
Eosinophils Relative: 2.7 % (ref 0.0–5.0)
HCT: 40.3 % (ref 36.0–46.0)
Hemoglobin: 13.3 g/dL (ref 12.0–15.0)
Lymphocytes Relative: 18.6 % (ref 12.0–46.0)
Lymphs Abs: 1.7 K/uL (ref 0.7–4.0)
MCHC: 32.9 g/dL (ref 30.0–36.0)
MCV: 93.5 fl (ref 78.0–100.0)
Monocytes Absolute: 0.8 K/uL (ref 0.1–1.0)
Monocytes Relative: 8.6 % (ref 3.0–12.0)
Neutro Abs: 6.3 K/uL (ref 1.4–7.7)
Neutrophils Relative %: 69.6 % (ref 43.0–77.0)
Platelets: 242 K/uL (ref 150.0–400.0)
RBC: 4.31 Mil/uL (ref 3.87–5.11)
RDW: 13.2 % (ref 11.5–15.5)
WBC: 9 K/uL (ref 4.0–10.5)

## 2023-11-02 LAB — COMPREHENSIVE METABOLIC PANEL WITH GFR
ALT: 13 U/L (ref 0–35)
AST: 16 U/L (ref 0–37)
Albumin: 4.4 g/dL (ref 3.5–5.2)
Alkaline Phosphatase: 52 U/L (ref 39–117)
BUN: 18 mg/dL (ref 6–23)
CO2: 25 meq/L (ref 19–32)
Calcium: 9.2 mg/dL (ref 8.4–10.5)
Chloride: 107 meq/L (ref 96–112)
Creatinine, Ser: 0.81 mg/dL (ref 0.40–1.20)
GFR: 71.3 mL/min (ref >60.00)
Glucose, Bld: 103 mg/dL — ABNORMAL HIGH (ref 70–99)
Potassium: 4.2 meq/L (ref 3.5–5.1)
Sodium: 138 meq/L (ref 135–145)
Total Bilirubin: 0.7 mg/dL (ref 0.2–1.2)
Total Protein: 7.4 g/dL (ref 6.0–8.3)

## 2023-11-02 LAB — MICROALBUMIN / CREATININE URINE RATIO
Creatinine,U: 102.2 mg/dL
Microalb Creat Ratio: 37.3 mg/g — ABNORMAL HIGH (ref 0.0–30.0)
Microalb, Ur: 3.8 mg/dL — ABNORMAL HIGH (ref 0.0–1.9)

## 2023-11-02 LAB — TSH: TSH: 2.54 u[IU]/mL (ref 0.35–5.50)

## 2023-11-02 LAB — VITAMIN D 25 HYDROXY (VIT D DEFICIENCY, FRACTURES): VITD: 21.12 ng/mL — ABNORMAL LOW (ref 30.00–100.00)

## 2023-11-02 LAB — HEMOGLOBIN A1C: Hgb A1c MFr Bld: 5.8 % (ref 4.6–6.5)

## 2023-11-02 MED ORDER — BUSPIRONE HCL 5 MG PO TABS: 5.0000 mg | ORAL_TABLET | Freq: Two times a day (BID) | ORAL | 11 refills | Status: DC

## 2023-11-02 NOTE — Progress Notes (Signed)
 Phone: [phone]   Subjective:  Patient presents today for their annual physical. Chief complaint-noted.   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   Required high dose PPI, now on prilosec     GAD (generalized anxiety disorder)    GERD (gastroesophageal reflux disease)    Glaucoma    Hyperlipidemia    Hypertension    Insomnia    MCI (mild cognitive impairment)    Sleep apnea    Sleep study conducted   Snoring 03/08/2008   Tested 2007 per patient-unrevealing for sleep apnea     Patient Active Problem List   Diagnosis Date Noted   Vitamin D  deficiency 11/02/2023    Priority: Medium    Glaucoma 03/08/2014    Priority: Medium    Insomnia 03/08/2014    Priority: Medium    Hyperglycemia 12/09/2009    Priority: Medium    Hyperlipidemia 10/13/2006    Priority: Medium    Major depression, recurrent, full remission 10/13/2006    Priority: Medium    Hypertension, essential 10/10/2006    Priority: Medium    Chronic rhinitis 11/04/2022    Priority: Low   Deviated nasal septum 11/04/2022    Priority: Low   Hypertrophy of nasal turbinates 11/04/2022    Priority: Low   Normocytic anemia 09/12/2014    Priority: Low   Stress incontinence, female 10/12/2011    Priority: Low   Vaginal atrophy 05/27/2011    Priority: Low   Obesity 10/11/2008    Priority: Low   GERD- failed h2 blocker 08/25/2007    Priority: Low   Osteoarthritis of ankle and foot 12/08/2018    Priority: 1.   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   Past Surgical History:  Procedure Laterality Date   CATARACT EXTRACTION     late 2019   COLONOSCOPY     COLPOSCOPY     age 62    GYNECOLOGIC CRYOSURGERY     age 7   HAMMER TOE SURGERY     TENDON REPAIR Left    TUBAL LIGATION      Family History  Problem Relation Age of Onset   Alzheimer's disease Mother    Hip fracture Mother    Osteoporosis Mother    Deep vein thrombosis Father    Heart disease Father        MI 39s, nonsmoker   Diabetes Sister    Hypertension Sister    Breast cancer Sister        18s   Arthritis Sister    Hypertension Brother    Diabetes Maternal Grandmother    Stroke Maternal Grandfather    Depression Paternal Grandmother    Suicidality Paternal Grandfather    Depression Paternal Grandfather    Colon cancer Neg Hx    Sleep apnea Neg Hx    Rectal cancer Neg Hx    Stomach cancer Neg Hx    Esophageal cancer Neg Hx     Medications- reviewed and updated Current Outpatient Medications  Medication Sig Dispense Refill   amLODipine -valsartan  (EXFORGE ) 5-320 MG tablet TAKE 1 TABLET BY MOUTH EVERY DAY 90 tablet 3   aspirin  EC 81 MG tablet Take 1 tablet (81  mg total) by mouth daily. Swallow whole. 30 tablet 12   busPIRone (BUSPAR) 5 MG tablet Take 1 tablet (5 mg total) by mouth 2 (two) times daily. 60 tablet 11   Coenzyme Q10 (COQ10 PO) Take 1 capsule by mouth daily.     estradiol  (VIVELLE -DOT) 0.05 MG/24HR patch Place 1 patch onto the skin 2 (two) times a week.     ezetimibe  (ZETIA ) 10 MG tablet TAKE 1 TABLET BY MOUTH EVERY DAY 90 tablet 3   latanoprost (XALATAN) 0.005 % ophthalmic solution 1 drop at bedtime.     magnesium (MAGTAB) 84 MG ( ) TBCR SR tablet Take 275 mg by mouth daily.     Multiple Vitamin (MULTIVITAMIN) tablet Take 1 tablet by mouth daily.     omeprazole (PRILOSEC) 20 MG capsule Take 20 mg by mouth daily.     OVER THE COUNTER MEDICATION Take 1 capsule by mouth daily. Vitamin B12     progesterone (PROMETRIUM) 200 MG capsule TAKE 1 CAPSULE BY MOUTH EVERY DAY AT BEDTIME FOR 30 DAYS     No current facility-administered medications for this visit.    Allergies-reviewed  and updated Allergies  Allergen Reactions   Amlodipine  Besy-Benazepril Hcl Other (See Comments)    Edema to ankle with redness/ but now she is tolerating the current regiment   Pravastatin  Other (See Comments)    myalgias   Rosuvastatin  Other (See Comments)    Severe fatigue when went to daily     Social History   Social History Narrative   Married (husband patient at [facility]). 3 children. 7 grandkids.       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       Hobbies: time with grandkids, walk when weather nice, reading, tv      Caffiene: 2 cups am, decaf occasional at night.    Objective  Objective:  BP 122/82 (BP Location: Left Arm, Patient Position: Sitting, Cuff Size: Normal)   Pulse 72   Temp 97.6 F (36.4 C) (Temporal)   Ht 5' 1 (1.549 m)   Wt 156 lb 9.6 oz (71 kg)   SpO2 96%   BMI 29.59 kg/m  Gen: NAD, resting comfortably HEENT: Mucous membranes are moist. Oropharynx normal Neck: no thyromegaly CV: RRR no murmurs rubs or gallops Lungs: CTAB no crackles, wheeze, rhonchi Abdomen: soft/nontender/nondistended/normal bowel sounds. No rebound or guarding.  Ext: no edema Skin: warm, dry Neuro: grossly normal, moves all extremities, PERRLA    Assessment and Plan  73 y.o. female presenting for annual physical.  Health Maintenance counseling: 1. Anticipatory guidance: Patient counseled regarding regular dental exams -q6 months, eye exams - twice yearly,  avoiding smoking and second hand smoke , limiting alcohol to 2 beverages per day - doesn't drink, no illicit drugs - none.   2. Risk factor reduction:  Advised patient of need for regular exercise and diet rich and fruits and vegetables to reduce risk of heart attack and stroke.  Exercise- not exercising outside of some light gardening- encouraged a graduated exercise program again this year with at least 5 minutes a day to start- walking is fine or whatever you enjoy.  Diet/weight  management-down 3 lbs form last visit. But up 10 lbs from last year but was down 22 lbs last year. Feels needs to cut down on unhealthy snacking agian Wt Readings from Last 3 Encounters:  11/02/23 156 lb 9.6 oz (71 kg)  07/20/23 159 lb (72.1 kg)  07/04/23 159 lb (  72.1 kg)  3. Immunizations/screenings/ancillary studies- COVID and flu declines. Declines shingrix  Immunization History  Administered Date(s) Administered   Fluad Quad(high Dose 65+) 11/10/2020   Influenza,inj,Quad PF,6+ Mos 11/15/2012, 10/30/2015, 10/12/2019   Influenza-Unspecified 09/25/2013, 11/10/2014, 10/29/2016   PFIZER(Purple Top)SARS-COV-2 Vaccination 02/15/2019, 03/08/2019, 11/09/2019   Pfizer Covid-19 Vaccine Bivalent Booster 56yrs & up 11/10/2020   Pneumococcal Conjugate-13 03/08/2014   Pneumococcal Polysaccharide-23 03/13/2015   Td 01/26/2003   Tdap 10/16/2015   Zoster, Live 12/12/2009  4. Cervical cancer screening- Dr. Estelle still does these at times- past formal age based screening requirements  5. Breast cancer screening-  breast exam with GYN  and mammogram 11/11/2022- due soon 6. Colon cancer screening - 07/20/2023 and reassuring- has graduated 7. Skin cancer screening-  Dr. Bonney BAIZE dermatology as needed- may see Dr. [place]. advised regular sunscreen use.  8. Birth control/STD check-  monogamous and post menopause   9. Osteoporosis screening at 65- DEXA 05/31/19 was normal plus on HRT  -never smoker   Status of chronic or acute concerns   #HRT_ on estradiol  patch  and progesterone through gynecology  -did have some spotting when starting in 2025 but reassuring ultrasound. Biopsy planned for recurrence with Dr. Estelle . Hot flashes improved. No improvement in fatigue. Some discharge working through but no blood lately  # Memory loss-released from neurology 10/21/22-has had DAT scan and PET metabolic which do not clearly point toward cause  -improvemetn at neuro visit 03/17/22- felt possibly related to  reducing psychiatric medications. Also reeasurring visit 10/21/22 -no recent issues after prior adjustments  #OSA- compliant with CPAP starting 2024  in past - feels allergies worse on this  on cpap -wants to trial oral appliance through Dr. Alm Ada with [facility]- using this caused irritation -plans to see Dr. Melba for another opinion  #Hypertension/CKD stage III S: Compliant with amlodipine -valsartan  5-320 mg.  Knows to avoid NSAIDs-Celebrex  prescribed by Dr. Verta in past is no longer on  Valsartan  is protective in case there is a proteinuric element. BP Readings from Last 3 Encounters:  11/02/23 122/82  07/20/23 (!) 107/42  05/03/23 138/78  A/P: well controlled continue current medications -CKD II- prior in stage III range but improved- stable for a few years. Does feel like urinates less in general over time  #Hyperlipidemia #concern for TIA 05/26/21- with aphasia- MRI, MRA, holter, echo largely reassuring other than LVH (cardiology saw) S: medication: Trouble with statins, aspirin  81 mg daily , trial zetia  10 mg 2024 -  Myalgias on pravastatin  in the past and rosuvastatin  even just 15 mg a week Lab Results  Component Value Date   CHOL 202 (H) 11/02/2023   HDL 61.40 11/02/2023   LDLCALC 121 (H) 11/02/2023   LDLDIRECT 131.0 02/18/2022   TRIG 99.0 11/02/2023   CHOLHDL 3 11/02/2023  A/P: lipids above goal but did not tolerate other medications- continue current medications as long as stable- update today    #Hyperglycemia S: Increased risk of diabetes.  No current medication.  Lifestyle modification only. Lab Results  Component Value Date   HGBA1C 5.8 11/02/2023   HGBA1C 5.6 11/30/2022   HGBA1C 6.0 02/18/2022  A/P: normal last year with weight loss - update again today with mild weight gain    #GERD S: Has been well controlled on Prilosec. zantac was not helpful for her.  A/P: doing well- continue current medications    #Depression in full remission but some anxiety #  Insomnia- better but still with active mind before  bed - see problem oriented note  #Glaucoma- regular follow up   #Vitamin D  deficiency S: Medication: prior high dose, now just through multivitamin        A/P: hopefully stable- update vitamin D  today. Continue current meds for now    Recommended follow up: Return in about 6 months (around 05/02/2024) for followup or sooner if needed.Schedule b4 you leave. Future Appointments  Date Time Provider Department Center  04/09/2024  1:00 PM Cary No, NP [facility] None  05/03/2024 10:20 AM Katrinka Garnette KIDD, MD [facility]  11/02/2024 10:00 AM Katrinka Garnette KIDD, MD [facility]   Lab/Order associations: fasting   ICD-10-CM   1. Encounter for general adult medical examination with abnormal findings  Z00.01     2. Insomnia, unspecified type  G47.00     3. Major depression, recurrent, full remission  F33.42     4. Mixed hyperlipidemia  E78.2 Comprehensive metabolic panel with GFR    CBC with Differential/Platelet    Lipid panel    TSH    5. Hyperglycemia  R73.9 HgB A1c    6. Screening for diabetes mellitus  Z13.1 HgB A1c    7. Vitamin D  deficiency  E55.9 VITAMIN D  25 Hydroxy (Vit-D Deficiency, Fractures)    8. Overweight  E66.3     9. Hypertension, essential  I10 Microalbumin / creatinine urine ratio      Meds ordered this encounter  Medications   busPIRone (BUSPAR) 5 MG tablet    Sig: Take 1 tablet (5 mg total) by mouth 2 (two) times daily.    Dispense:  60 tablet    Refill:  11    Return precautions advised.  Garnette Katrinka, MD

## 2023-11-02 NOTE — Progress Notes (Signed)
 Phone [phone] In person visit   Subjective:   Ann Washington is a 73 y.o. year old very pleasant female patient who presents for/with See problem oriented charting  Past Medical History-  Patient Active Problem List   Diagnosis Date Noted   Vitamin D  deficiency 11/02/2023    Priority: Medium    Glaucoma 03/08/2014    Priority: Medium    Insomnia 03/08/2014    Priority: Medium    Hyperglycemia 12/09/2009    Priority: Medium    Hyperlipidemia 10/13/2006    Priority: Medium    Major depression, recurrent, full remission 10/13/2006    Priority: Medium    Hypertension, essential 10/10/2006    Priority: Medium    Chronic rhinitis 11/04/2022    Priority: Low   Deviated nasal septum 11/04/2022    Priority: Low   Hypertrophy of nasal turbinates 11/04/2022    Priority: Low   Normocytic anemia 09/12/2014    Priority: Low   Stress incontinence, female 10/12/2011    Priority: Low   Vaginal atrophy 05/27/2011    Priority: Low   Obesity 10/11/2008    Priority: Low   GERD- failed h2 blocker 08/25/2007    Priority: Low   Osteoarthritis of ankle and foot 12/08/2018    Priority: 1.   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    Medications- reviewed and updated Current Outpatient Medications  Medication Sig Dispense Refill   amLODipine -valsartan  (EXFORGE ) 5-320 MG tablet TAKE 1 TABLET BY MOUTH EVERY DAY 90 tablet 3   aspirin  EC 81 MG tablet Take 1 tablet (81 mg total) by mouth daily. Swallow whole. 30 tablet 12   Coenzyme Q10 (COQ10 PO) Take 1 capsule by mouth daily.     estradiol  (VIVELLE -DOT) 0.05 MG/24HR patch Place 1 patch onto the skin 2 (two) times a week.     ezetimibe  (ZETIA ) 10 MG tablet TAKE 1 TABLET BY MOUTH EVERY DAY 90 tablet 3   latanoprost (XALATAN) 0.005 % ophthalmic solution 1 drop at bedtime.     magnesium (MAGTAB) 84 MG ( ) TBCR SR tablet Take 275 mg by mouth daily.     Multiple Vitamin (MULTIVITAMIN) tablet Take 1 tablet by mouth daily.     omeprazole (PRILOSEC)  20 MG capsule Take 20 mg by mouth daily.     OVER THE COUNTER MEDICATION Take 1 capsule by mouth daily. Vitamin B12     progesterone (PROMETRIUM) 200 MG capsule TAKE 1 CAPSULE BY MOUTH EVERY DAY AT BEDTIME FOR 30 DAYS     busPIRone (BUSPAR) 5 MG tablet Take 1 tablet (5 mg total) by mouth 2 (two) times daily. 60 tablet 11   No current facility-administered medications for this visit.     Objective:  BP 122/82 (BP Location: Left Arm, Patient Position: Sitting, Cuff Size: Normal)   Pulse 72   Temp 97.6 F (36.4 C) (Temporal)   Ht 5' 1 (1.549 m)   Wt 156 lb 9.6 oz (71 kg)   SpO2 96%   BMI 29.59 kg/m  Gen: NAD, resting comfortably    Assessment and Plan    #Depression in full remission but some anxiety # Insomnia- better but still with active mind before bed S: medication: none      11/02/2023    9:18 AM 05/03/2023   10:52 AM 03/02/2023   11:42 AM  Depression screen PHQ 2/9  Decreased Interest 0 0 0  Down, Depressed, Hopeless 0 0 0  PHQ - 2 Score 0 0 0  Altered sleeping 3 0  Tired, decreased energy 3 0   Change in appetite 0 0   Feeling bad or failure about yourself  0 0   Trouble concentrating 0 0   Moving slowly or fidgety/restless 0 0   Suicidal thoughts 0 0   PHQ-9 Score 6 0   Difficult doing work/chores Somewhat difficult Not difficult at all   A/P: still doing well off all medicines for depression but insomnia with poor control (phq9 slightly high but mainly fatigue issues and doesn't feel resting well contributing with busy thoughts leading to poorer sleep- feels like needs something to take edge off)- - she wanted to trial busiprone to see if this would help at 5 mg in the evening- could take twice a day if needed or if not enoguh in evening after a week or two could try 10 mg - considered gabapentin 100 mg as well -hydroxyzine  and benzodiazepines less than ideal with her age and fall risk plus history memory issues  Recommended follow up: Return in about 6 months  (around 05/02/2024) for followup or sooner if needed.Schedule b4 you leave. Future Appointments  Date Time Provider Department Center  04/09/2024  1:00 PM Lomax, Amy, NP [facility] None    Lab/Order associations:   ICD-10-CM   1. Insomnia, unspecified type  G47.00     2. Major depression, recurrent, full remission  F33.42     Meds ordered this encounter  Medications   busPIRone (BUSPAR) 5 MG tablet    Sig: Take 1 tablet (5 mg total) by mouth 2 (two) times daily.    Dispense:  60 tablet    Refill:  11    Return precautions advised.  Garnette Lukes, MD

## 2023-11-02 NOTE — Patient Instructions (Addendum)
 Please stop by lab before you go If you have mychart- we will send your results within 3 business days of us  receiving them.  If you do not have mychart- we will call you about results within 5 business days of us  receiving them.  *please also note that you will see labs on mychart as soon as they post. I will later go in and write notes on them- will say notes from Dr. Katrinka   not exercising outside of some light gardening- encouraged a graduated exercise program again this year with at least 5 minutes a day to start- walking is fine or whatever you enjoy.   Trial buspirone/buspar 5 mg in the evening- if not effective after 1-2 weeks could take 2 doses so 10 mg total or 2 pills. We also considered gabapentin- reach out if you want to change and tiral this  Recommended follow up: Return in about 6 months (around 05/02/2024) for followup or sooner if needed.Schedule b4 you leave.

## 2023-11-03 ENCOUNTER — Ambulatory Visit: Payer: Self-pay | Admitting: Family Medicine

## 2023-11-03 MED ORDER — VITAMIN D (ERGOCALCIFEROL) 1.25 MG (50000 UNIT) PO CAPS: 50000.0000 [IU] | ORAL_CAPSULE | ORAL | 0 refills | Status: AC

## 2023-11-26 ENCOUNTER — Other Ambulatory Visit: Payer: Self-pay | Admitting: Family Medicine

## 2024-04-09 ENCOUNTER — Ambulatory Visit: Admitting: Family Medicine

## 2024-05-03 ENCOUNTER — Ambulatory Visit: Admitting: Family Medicine

## 2024-11-02 ENCOUNTER — Encounter: Admitting: Family Medicine
